# Patient Record
Sex: Male | Born: 1960
Health system: Southern US, Community
[De-identification: ages and names within clinical notes are randomized; demographics above are authoritative.]

## PROBLEM LIST (undated history)

## (undated) DIAGNOSIS — K219 Gastro-esophageal reflux disease without esophagitis: Secondary | ICD-10-CM

## (undated) DIAGNOSIS — Z8711 Personal history of peptic ulcer disease: Secondary | ICD-10-CM

## (undated) DIAGNOSIS — F1120 Opioid dependence, uncomplicated: Secondary | ICD-10-CM

## (undated) DIAGNOSIS — M1990 Unspecified osteoarthritis, unspecified site: Secondary | ICD-10-CM

## (undated) DIAGNOSIS — F1920 Other psychoactive substance dependence, uncomplicated: Secondary | ICD-10-CM

## (undated) DIAGNOSIS — E1165 Type 2 diabetes mellitus with hyperglycemia: Secondary | ICD-10-CM

## (undated) DIAGNOSIS — R32 Unspecified urinary incontinence: Secondary | ICD-10-CM

## (undated) DIAGNOSIS — I700 Atherosclerosis of aorta: Secondary | ICD-10-CM

## (undated) DIAGNOSIS — J189 Pneumonia, unspecified organism: Secondary | ICD-10-CM

## (undated) DIAGNOSIS — M5136 Other intervertebral disc degeneration, lumbar region: Secondary | ICD-10-CM

## (undated) DIAGNOSIS — Z652 Problems related to release from prison: Secondary | ICD-10-CM

## (undated) DIAGNOSIS — I10 Essential (primary) hypertension: Secondary | ICD-10-CM

## (undated) DIAGNOSIS — F331 Major depressive disorder, recurrent, moderate: Secondary | ICD-10-CM

## (undated) DIAGNOSIS — I708 Atherosclerosis of other arteries: Secondary | ICD-10-CM

## (undated) DIAGNOSIS — Z8719 Personal history of other diseases of the digestive system: Secondary | ICD-10-CM

## (undated) DIAGNOSIS — E785 Hyperlipidemia, unspecified: Secondary | ICD-10-CM

## (undated) DIAGNOSIS — R7611 Nonspecific reaction to tuberculin skin test without active tuberculosis: Secondary | ICD-10-CM

## (undated) DIAGNOSIS — H3530 Unspecified macular degeneration: Secondary | ICD-10-CM

## (undated) DIAGNOSIS — IMO0002 Reserved for concepts with insufficient information to code with codable children: Secondary | ICD-10-CM

## (undated) DIAGNOSIS — F32A Depression, unspecified: Secondary | ICD-10-CM

## (undated) DIAGNOSIS — L409 Psoriasis, unspecified: Secondary | ICD-10-CM

## (undated) DIAGNOSIS — F410 Panic disorder [episodic paroxysmal anxiety] without agoraphobia: Secondary | ICD-10-CM

## (undated) DIAGNOSIS — F064 Anxiety disorder due to known physiological condition: Secondary | ICD-10-CM

## (undated) DIAGNOSIS — H269 Unspecified cataract: Secondary | ICD-10-CM

## (undated) DIAGNOSIS — F329 Major depressive disorder, single episode, unspecified: Secondary | ICD-10-CM

## (undated) DIAGNOSIS — E1140 Type 2 diabetes mellitus with diabetic neuropathy, unspecified: Secondary | ICD-10-CM

## (undated) DIAGNOSIS — J449 Chronic obstructive pulmonary disease, unspecified: Secondary | ICD-10-CM

## (undated) DIAGNOSIS — B1920 Unspecified viral hepatitis C without hepatic coma: Secondary | ICD-10-CM

## (undated) DIAGNOSIS — M51369 Other intervertebral disc degeneration, lumbar region without mention of lumbar back pain or lower extremity pain: Secondary | ICD-10-CM

## (undated) DIAGNOSIS — J439 Emphysema, unspecified: Secondary | ICD-10-CM

## (undated) HISTORY — DX: Opioid dependence, uncomplicated: F11.20

## (undated) HISTORY — DX: Type 2 diabetes mellitus with diabetic neuropathy, unspecified: E11.40

## (undated) HISTORY — DX: Unspecified macular degeneration: H35.30

## (undated) HISTORY — DX: Reserved for concepts with insufficient information to code with codable children: IMO0002

## (undated) HISTORY — DX: Depression, unspecified: F32.A

## (undated) HISTORY — DX: Major depressive disorder, single episode, unspecified: F32.9

## (undated) HISTORY — DX: Other psychoactive substance dependence, uncomplicated: F19.20

## (undated) HISTORY — DX: Atherosclerosis of other arteries: I70.8

## (undated) HISTORY — DX: Unspecified cataract: H26.9

## (undated) HISTORY — DX: Nonspecific reaction to tuberculin skin test without active tuberculosis: R76.11

## (undated) HISTORY — DX: Type 2 diabetes mellitus with hyperglycemia: E11.65

## (undated) HISTORY — DX: Personal history of other diseases of the digestive system: Z87.19

## (undated) HISTORY — DX: Anxiety disorder due to known physiological condition: F06.4

## (undated) HISTORY — DX: Atherosclerosis of aorta: I70.0

## (undated) HISTORY — DX: Problems related to release from prison: Z65.2

## (undated) HISTORY — DX: Hyperlipidemia, unspecified: E78.5

## (undated) HISTORY — DX: Unspecified urinary incontinence: R32

## (undated) HISTORY — DX: Psoriasis, unspecified: L40.9

## (undated) HISTORY — DX: Emphysema, unspecified: J43.9

## (undated) HISTORY — DX: Major depressive disorder, recurrent, moderate: F33.1

## (undated) HISTORY — PX: STRABISMUS SURGERY: SHX218

## (undated) HISTORY — PX: TONSILLECTOMY: SUR1361

## (undated) HISTORY — DX: Chronic obstructive pulmonary disease, unspecified: J44.9

## (undated) HISTORY — DX: Other intervertebral disc degeneration, lumbar region without mention of lumbar back pain or lower extremity pain: M51.369

## (undated) HISTORY — DX: Other intervertebral disc degeneration, lumbar region: M51.36

## (undated) HISTORY — DX: Personal history of peptic ulcer disease: Z87.11

## (undated) HISTORY — DX: Gastro-esophageal reflux disease without esophagitis: K21.9

## (undated) HISTORY — DX: Anxiety disorder due to known physiological condition: F41.0

---

## 1987-03-19 DIAGNOSIS — R7611 Nonspecific reaction to tuberculin skin test without active tuberculosis: Secondary | ICD-10-CM

## 1987-03-19 HISTORY — DX: Nonspecific reaction to tuberculin skin test without active tuberculosis: R76.11

## 2014-07-17 DIAGNOSIS — Z652 Problems related to release from prison: Secondary | ICD-10-CM

## 2014-07-17 HISTORY — DX: Problems related to release from prison: Z65.2

## 2014-08-19 ENCOUNTER — Emergency Department (INDEPENDENT_AMBULATORY_CARE_PROVIDER_SITE_OTHER)
Admission: EM | Admit: 2014-08-19 | Discharge: 2014-08-19 | Disposition: A | Discharge status: Home / Self Care | Payer: Self-pay | Source: Home / Self Care | Attending: Family Medicine | Admitting: Family Medicine

## 2014-08-19 ENCOUNTER — Encounter (HOSPITAL_COMMUNITY): Payer: Self-pay

## 2014-08-19 DIAGNOSIS — N138 Other obstructive and reflux uropathy: Secondary | ICD-10-CM

## 2014-08-19 DIAGNOSIS — N401 Enlarged prostate with lower urinary tract symptoms: Secondary | ICD-10-CM

## 2014-08-19 MED ORDER — TAMSULOSIN HCL 0.4 MG PO CAPS: 0.4000 mg | ORAL_CAPSULE | Freq: Every day | ORAL | Status: DC

## 2014-08-19 NOTE — ED Notes (Signed)
Reportedly out of prison x 2 weeks, needs new Rx, as he is almost out. Has not established new PCP

## 2014-08-19 NOTE — ED Provider Notes (Signed)
CSN: [id number]     Arrival date & time 08/19/14  0900 History   First MD Initiated Contact with Patient 08/19/14 [phone]     Chief Complaint  Patient presents with  . Medication Refill   (Consider location/radiation/quality/duration/timing/severity/associated sxs/prior Treatment) Patient is a 54 y.o. male presenting with general illness. The history is provided by the patient.  Illness Severity:  Mild Chronicity:  New Context:  Promarily needs flomax for urine problem. Associated symptoms: no nausea and no vomiting   Risk factors:  Diabetic   No past medical history on file. No past surgical history on file. No family history on file. History  Substance Use Topics  . Smoking status: Not on file  . Smokeless tobacco: Not on file  . Alcohol Use: Not on file    Review of Systems  Constitutional: Negative.   Gastrointestinal: Negative for nausea and vomiting.  Genitourinary: Positive for difficulty urinating.    Allergies  Penicillins  Home Medications   Prior to Admission medications   Not on File   BP 151/64 mmHg  Pulse 87  Temp(Src) 98.4 F (36.9 C) (Oral)  Resp 16  SpO2 96% Physical Exam  Constitutional: He is oriented to person, place, and time. He appears well-developed and well-nourished.  Neck: Neck supple.  Cardiovascular: Normal rate.   Abdominal: Soft. Bowel sounds are normal.  Neurological: He is alert and oriented to person, place, and time.  Skin: Skin is warm and dry.  Nursing note and vitals reviewed.   ED Course  Procedures (including critical care time) Labs Review Labs Reviewed - No data to display  Imaging Review No results found.   MDM  No diagnosis found.     Linna HoffJames D Leaann Nevils, MD 08/19/14 1002

## 2014-09-08 ENCOUNTER — Ambulatory Visit: Payer: Self-pay | Admitting: Adult Health

## 2014-09-16 ENCOUNTER — Ambulatory Visit (INDEPENDENT_AMBULATORY_CARE_PROVIDER_SITE_OTHER): Payer: Self-pay | Admitting: Family Medicine

## 2014-09-16 ENCOUNTER — Encounter: Payer: Self-pay | Admitting: Family Medicine

## 2014-09-16 VITALS — BP 128/71 | HR 98 | Temp 98.1°F | Resp 16 | Ht 74.0 in | Wt 296.0 lb

## 2014-09-16 DIAGNOSIS — G894 Chronic pain syndrome: Secondary | ICD-10-CM

## 2014-09-16 DIAGNOSIS — E088 Diabetes mellitus due to underlying condition with unspecified complications: Secondary | ICD-10-CM

## 2014-09-16 DIAGNOSIS — E1140 Type 2 diabetes mellitus with diabetic neuropathy, unspecified: Secondary | ICD-10-CM | POA: Insufficient documentation

## 2014-09-16 DIAGNOSIS — I10 Essential (primary) hypertension: Secondary | ICD-10-CM

## 2014-09-16 DIAGNOSIS — N400 Enlarged prostate without lower urinary tract symptoms: Secondary | ICD-10-CM

## 2014-09-16 DIAGNOSIS — E118 Type 2 diabetes mellitus with unspecified complications: Secondary | ICD-10-CM

## 2014-09-16 LAB — CBC WITH DIFFERENTIAL/PLATELET
BASOS PCT: 0 % (ref 0–1)
Basophils Absolute: 0 10*3/uL (ref 0.0–0.1)
Eosinophils Absolute: 0.2 10*3/uL (ref 0.0–0.7)
Eosinophils Relative: 2 % (ref 0–5)
HEMATOCRIT: 47.2 % (ref 39.0–52.0)
Hemoglobin: 15.4 g/dL (ref 13.0–17.0)
Lymphocytes Relative: 18 % (ref 12–46)
Lymphs Abs: 1.5 10*3/uL (ref 0.7–4.0)
MCH: 30.5 pg (ref 26.0–34.0)
MCHC: 32.6 g/dL (ref 30.0–36.0)
MCV: 93.5 fL (ref 78.0–100.0)
MONO ABS: 0.8 10*3/uL (ref 0.1–1.0)
MPV: 12.8 fL — AB (ref 8.6–12.4)
Monocytes Relative: 9 % (ref 3–12)
NEUTROS ABS: 6.1 10*3/uL (ref 1.7–7.7)
NEUTROS PCT: 71 % (ref 43–77)
Platelets: 139 10*3/uL — ABNORMAL LOW (ref 150–400)
RBC: 5.05 MIL/uL (ref 4.22–5.81)
RDW: 14.5 % (ref 11.5–15.5)
WBC: 8.6 10*3/uL (ref 4.0–10.5)

## 2014-09-16 LAB — COMPLETE METABOLIC PANEL WITH GFR
ALBUMIN: 3.8 g/dL (ref 3.5–5.2)
ALT: 21 U/L (ref 0–53)
AST: 22 U/L (ref 0–37)
Alkaline Phosphatase: 103 U/L (ref 39–117)
BUN: 12 mg/dL (ref 6–23)
CALCIUM: 8.8 mg/dL (ref 8.4–10.5)
CO2: 22 mEq/L (ref 19–32)
CREATININE: 0.87 mg/dL (ref 0.50–1.35)
Chloride: 100 mEq/L (ref 96–112)
GFR, Est African American: >89 mL/min
GFR, Est Non African American: >89 mL/min
Glucose, Bld: 226 mg/dL — ABNORMAL HIGH (ref 70–99)
POTASSIUM: 4.7 meq/L (ref 3.5–5.3)
Sodium: 137 mEq/L (ref 135–145)
Total Bilirubin: 0.4 mg/dL (ref 0.2–1.2)
Total Protein: 6.3 g/dL (ref 6.0–8.3)

## 2014-09-16 MED ORDER — TAMSULOSIN HCL 0.4 MG PO CAPS: 0.4000 mg | ORAL_CAPSULE | Freq: Every day | ORAL | Status: DC

## 2014-09-16 MED ORDER — IBUPROFEN 600 MG PO TABS: 600.0000 mg | ORAL_TABLET | Freq: Three times a day (TID) | ORAL | Status: DC | PRN

## 2014-09-16 MED ORDER — GABAPENTIN 800 MG PO TABS: 800.0000 mg | ORAL_TABLET | Freq: Three times a day (TID) | ORAL | Status: DC

## 2014-09-16 MED ORDER — OMEPRAZOLE 20 MG PO CPDR: 20.0000 mg | DELAYED_RELEASE_CAPSULE | Freq: Two times a day (BID) | ORAL | Status: DC

## 2014-09-16 MED ORDER — GABAPENTIN 300 MG PO CAPS: 300.0000 mg | ORAL_CAPSULE | Freq: Two times a day (BID) | ORAL | Status: DC

## 2014-09-16 MED ORDER — TAMSULOSIN HCL 0.4 MG PO CAPS: ORAL_CAPSULE | ORAL | Status: DC

## 2014-09-16 MED ORDER — LISINOPRIL 10 MG PO TABS: 10.0000 mg | ORAL_TABLET | Freq: Every day | ORAL | Status: DC

## 2014-09-16 MED ORDER — INSULIN ASPART 100 UNIT/ML ~~LOC~~ SOLN: SUBCUTANEOUS | Status: DC

## 2014-09-16 MED ORDER — GLYBURIDE 1.25 MG PO TABS: 1.2500 mg | ORAL_TABLET | Freq: Every day | ORAL | Status: DC

## 2014-09-16 MED ORDER — OMEPRAZOLE 20 MG PO CPDR: 20.0000 mg | DELAYED_RELEASE_CAPSULE | Freq: Every day | ORAL | Status: DC

## 2014-09-16 MED ORDER — METFORMIN HCL 1000 MG PO TABS: 1000.0000 mg | ORAL_TABLET | Freq: Two times a day (BID) | ORAL | Status: DC

## 2014-09-16 MED ORDER — INSULIN SYRINGES (DISPOSABLE) U-100 1 ML MISC: Status: DC

## 2014-09-16 NOTE — Patient Instructions (Signed)
1. Low carb diet 2. Try to increase exercise 3.  Take medications as prescribed 4.  Will call with insulin dosage after labs are back. 5.  Recheck about medication assistance.

## 2014-09-16 NOTE — Progress Notes (Signed)
Patient ID: Randy BattiestRoger D Rosete, male   DOB: 1960/11/26, 54 y.o.   MRN: [id number]   Randy FordyceRoger Rockers, is a 54 y.o. male  NFA:[id number]  ION:[id number]  DOB - 1960/11/26  CC:  Chief Complaint  Patient presents with  . Establish Care    needs to re-start medications        HPI: Randy Barnes is a 54 y.o. male here today to establish medical care. He is recently out of prison after a twenty year stay and is having trouble getting his medications for diabetes, hypertension, peripheral neuropathy, GERD, BPH, chronic pain and anxiety. He also has Hepatitis C.  He has been on Novolog in the past but has run out. He is on metformin 1000 bid, glyburide 1.25 daily, lisinopril 10 daily, gabapentin 100 tid. Omeprazole 10 mg daily, flomax.4 mg daily. He is currently using OTC IBU for pain, which he reports is no adequately controlling his pain. He is also requesting Xanax for anxiety.   Allergies  Allergen Reactions  . Penicillins    No past medical history on file. Current Outpatient Prescriptions on File Prior to Visit  Medication Sig Dispense Refill  . gabapentin (NEURONTIN) 100 MG capsule Take 100 mg by mouth 3 (three) times daily.    Marland Kitchen. glyBURIDE (DIABETA) 1.25 MG tablet Take 1.25 mg by mouth daily with breakfast.    . lisinopril (PRINIVIL,ZESTRIL) 10 MG tablet Take 10 mg by mouth daily.    . metFORMIN (GLUCOPHAGE) 1000 MG tablet Take 1,000 mg by mouth 2 (two) times daily with a meal.    . omeprazole (PRILOSEC) 10 MG capsule Take 10 mg by mouth daily.    . tamsulosin (FLOMAX) 0.4 MG CAPS capsule Take 1 capsule (0.4 mg total) by mouth daily after supper. 30 capsule 1  . ALPRAZolam (XANAX) 0.25 MG tablet Take 0.25 mg by mouth at bedtime as needed for anxiety.     No current facility-administered medications on file prior to visit.   No family history on file. History   Social History  . Marital Status: Single    Spouse Name: N/A  . Number of Children: N/A  . Years of Education: N/A    Occupational History  . Not on file.   Social History Main Topics  . Smoking status: Current Every Day Smoker -- 0.50 packs/day    Types: Cigarettes  . Smokeless tobacco: Never Used  . Alcohol Use: 3.6 oz/week    6 Cans of beer per week  . Drug Use: Yes    Special: Marijuana  . Sexual Activity: No   Other Topics Concern  . Not on file   Social History Narrative    Review of Systems: Constitutional: Negative for fever, chills, appetite change, weight loss. Positive for fatigue. Skin: Positive for rashes HENT: Negative for ear pain, ear discharge.nose bleeds Eyes: Negative for pain, discharge, redness, itching and visual disturbance.Positive for decreased vision, wears glasses. Neck: Negative for pain, stiffness Respiratory: Negative for cough. Positive for SOB with anxiety attacks Cardiovascular: Negative for chest pain, palpitations. Positive for intermittent lower extremety edema. Gastrointestinal: Negative for abdominal distention, abdominal pain, nausea, vomiting, diarrhea, constipations. Positive for heartburn Genitourinary: Negative for dysuria, urgency, frequency, hematuria, flank pain,  Musculoskeletal: Negative for back pain, joint pain, joint  swelling, arthralgia and gait problem.Negative for weakness. Neurological: Negative for dizziness, tremors, seizures, syncope,   light-headedness. Positive for numbness and tingling of feet  And for headaches.  Hematological: Negative for easy bruising or bleeding Psychiatric/Behavioral: Negative for depression,  decreased concentration, confusion. Positive for anxiety   Objective:   Filed Vitals:   09/16/14 0837  BP: 128/71  Pulse: 98  Temp: 98.1 F (36.7 C)  Resp: 16    Physical Exam: Constitutional: Patient appears well-developed and well-nourished. No distress.OBESE HENT: Normocephalic, atraumatic, External right and left ear normal. Oropharynx is clear and moist.  Eyes: Conjunctivae and EOM are normal. PERRLA,  no scleral icterus. Neck: Normal ROM. Neck supple. No lymphadenopathy, No thyromegaly. CVS: RRR, S1/S2 +, no murmurs, no gallops, no rubs. There is discoloration of lower legs with petechiae and two healed lesions. Pulmonary: Effort and breath sounds normal, no stridor, rhonchi, wheezes, rales.  Abdominal: Soft. Normoactive BS,, no distension, tenderness, rebound or guarding.  Musculoskeletal: Normal range of motion. No edema and no tenderness.  Neuro: Alert.Normal muscle tone coordination. Non-focal Skin: Skin is warm and dry. There is a scattered ecezamatous rash. The Psychiatric: Normal mood and affect. Behavior, judgment, thought content normal.  No results found for: WBC, HGB, HCT, MCV, PLT No results found for: CREATININE, BUN, NA, K, CL, CO2  No results found for: HGBA1C Lipid Panel  No results found for: CHOL, TRIG, HDL, CHOLHDL, VLDL, LDLCALC     Assessment and plan:   Diabetes, uncontrolled  - continue metformina 1000 bid -continue glyburide 1.25 daily -Cmet with [facility] today -A1C today.  - Novolog insulin, will call with dosage after labs  Hypertension - lisinopril 10 mg daily for hypertension and renal protection  BPH - continue Flomax .  daily  Peripheral neuropathy -gabapentin 300 mg bid -He is to check on medication assistance and we can consider Lyrica if he can get that covered  Chronic pain -I have explained we do no prescribe narcotics for chronic pain -ibuprofen 800 mg tid prn -will attempt referral to pain clinic  GERD, inadequately controlled -increase omerprazole from 10 to 20 mg. daily  He is to follow-up in one month for reevaluation of chronic conditions.    The patient was given clear instructions to go to ER or return to medical center if symptoms don't improve, worsen or new problems develop. The patient verbalized understanding. The patient was told to call to get lab results if they haven't heard anything in the next week.         Henrietta Hoover, MSN, FNP-BC  09/16/2014, 9:16 AM

## 2014-09-17 LAB — HEMOGLOBIN A1C
HEMOGLOBIN A1C: 8.4 % — AB (ref <5.7)
Mean Plasma Glucose: 194 mg/dL — ABNORMAL HIGH (ref <117)

## 2014-09-21 ENCOUNTER — Telehealth: Payer: Self-pay

## 2014-09-21 NOTE — Telephone Encounter (Signed)
-----   Message from Henrietta HooverLinda C Bernhardt, NP sent at 09/20/2014  8:26 AM EDT ----- HBgA1C 8.4. We really need to work on getting below 8, preferable less than 7.5. Watch diet very carefully for carbs and concentrated sweets. Try to increase exercise. Lets increase glyburide to 2 a day in am. When you run out witll increase dosage.

## 2014-09-21 NOTE — Telephone Encounter (Signed)
Patient returned call, I advised patient of elevated hgba1c and to increase glyburide to 2 tablets a day, to modify diet and increase exercise. Patient verbalized understanding. Thanks!

## 2014-09-21 NOTE — Telephone Encounter (Signed)
Called and left message for patient to return call regarding lab work. Thanks!  

## 2014-10-21 ENCOUNTER — Ambulatory Visit: Payer: Self-pay

## 2014-10-25 ENCOUNTER — Ambulatory Visit: Payer: Self-pay | Admitting: Family Medicine

## 2014-11-13 ENCOUNTER — Encounter (HOSPITAL_COMMUNITY): Payer: Self-pay

## 2014-11-13 ENCOUNTER — Emergency Department (HOSPITAL_COMMUNITY)
Admission: EM | Admit: 2014-11-13 | Discharge: 2014-11-13 | Disposition: A | Discharge status: Home / Self Care | Payer: Self-pay | Attending: Emergency Medicine | Admitting: Emergency Medicine

## 2014-11-13 DIAGNOSIS — Z79899 Other long term (current) drug therapy: Secondary | ICD-10-CM | POA: Insufficient documentation

## 2014-11-13 DIAGNOSIS — Z794 Long term (current) use of insulin: Secondary | ICD-10-CM | POA: Insufficient documentation

## 2014-11-13 DIAGNOSIS — I10 Essential (primary) hypertension: Secondary | ICD-10-CM | POA: Insufficient documentation

## 2014-11-13 DIAGNOSIS — Y9389 Activity, other specified: Secondary | ICD-10-CM | POA: Insufficient documentation

## 2014-11-13 DIAGNOSIS — Y9289 Other specified places as the place of occurrence of the external cause: Secondary | ICD-10-CM | POA: Insufficient documentation

## 2014-11-13 DIAGNOSIS — S3992XA Unspecified injury of lower back, initial encounter: Secondary | ICD-10-CM | POA: Insufficient documentation

## 2014-11-13 DIAGNOSIS — E119 Type 2 diabetes mellitus without complications: Secondary | ICD-10-CM | POA: Insufficient documentation

## 2014-11-13 DIAGNOSIS — W1839XA Other fall on same level, initial encounter: Secondary | ICD-10-CM | POA: Insufficient documentation

## 2014-11-13 DIAGNOSIS — Y998 Other external cause status: Secondary | ICD-10-CM | POA: Insufficient documentation

## 2014-11-13 DIAGNOSIS — Z8619 Personal history of other infectious and parasitic diseases: Secondary | ICD-10-CM | POA: Insufficient documentation

## 2014-11-13 DIAGNOSIS — M5441 Lumbago with sciatica, right side: Secondary | ICD-10-CM | POA: Insufficient documentation

## 2014-11-13 DIAGNOSIS — M1990 Unspecified osteoarthritis, unspecified site: Secondary | ICD-10-CM | POA: Insufficient documentation

## 2014-11-13 DIAGNOSIS — S8991XA Unspecified injury of right lower leg, initial encounter: Secondary | ICD-10-CM | POA: Insufficient documentation

## 2014-11-13 DIAGNOSIS — Z720 Tobacco use: Secondary | ICD-10-CM | POA: Insufficient documentation

## 2014-11-13 DIAGNOSIS — Z880 Allergy status to penicillin: Secondary | ICD-10-CM | POA: Insufficient documentation

## 2014-11-13 HISTORY — DX: Essential (primary) hypertension: I10

## 2014-11-13 HISTORY — DX: Unspecified viral hepatitis C without hepatic coma: B19.20

## 2014-11-13 HISTORY — DX: Unspecified osteoarthritis, unspecified site: M19.90

## 2014-11-13 MED ORDER — METHOCARBAMOL 1000 MG/10ML IJ SOLN
1000.0000 mg | Freq: Once | INTRAMUSCULAR | Status: DC
  Filled 2014-11-13: qty 10

## 2014-11-13 MED ORDER — METHOCARBAMOL 500 MG PO TABS
1000.0000 mg | ORAL_TABLET | Freq: Once | ORAL | Status: AC
  Administered 2014-11-13: 1000 mg via ORAL
  Filled 2014-11-13: qty 2

## 2014-11-13 MED ORDER — KETOROLAC TROMETHAMINE 60 MG/2ML IM SOLN
60.0000 mg | Freq: Once | INTRAMUSCULAR | Status: AC
  Administered 2014-11-13: 60 mg via INTRAMUSCULAR
  Filled 2014-11-13: qty 2

## 2014-11-13 MED ORDER — CYCLOBENZAPRINE HCL 10 MG PO TABS: 10.0000 mg | ORAL_TABLET | Freq: Two times a day (BID) | ORAL | Status: DC | PRN

## 2014-11-13 NOTE — Discharge Instructions (Signed)
Back Pain, Adult °Back pain is very common. The pain often gets better over time. The cause of back pain is usually not dangerous. Most people can learn to manage their back pain on their own.  °HOME CARE  °· Stay active. Start with short walks on flat ground if you can. Try to walk farther each day. °· Do not sit, drive, or stand in one place for more than 30 minutes. Do not stay in bed. °· Do not avoid exercise or work. Activity can help your back heal faster. °· Be careful when you bend or lift an object. Bend at your knees, keep the object close to you, and do not twist. °· Sleep on a firm mattress. Lie on your side, and bend your knees. If you lie on your back, put a pillow under your knees. °· Only take medicines as told by your doctor. °· Put ice on the injured area. °¨ Put ice in a plastic bag. °¨ Place a towel between your skin and the bag. °¨ Leave the ice on for 15-20 minutes, 03-04 times a day for the first 2 to 3 days. After that, you can switch between ice and heat packs. °· Ask your doctor about back exercises or massage. °· Avoid feeling anxious or stressed. Find good ways to deal with stress, such as exercise. °GET HELP RIGHT AWAY IF:  °· Your pain does not go away with rest or medicine. °· Your pain does not go away in 1 week. °· You have new problems. °· You do not feel well. °· The pain spreads into your legs. °· You cannot control when you poop (bowel movement) or pee (urinate). °· Your arms or legs feel weak or lose feeling (numbness). °· You feel sick to your stomach (nauseous) or throw up (vomit). °· You have belly (abdominal) pain. °· You feel like you may pass out (faint). °MAKE SURE YOU:  °· Understand these instructions. °· Will watch your condition. °· Will get help right away if you are not doing well or get worse. °Document Released: 08/21/2007 Document Revised: 05/27/2011 Document Reviewed: 07/06/2013 °[employer]® Patient Information ©2015 [employer]. This information is not intended  to replace advice given to you by your health care provider. Make sure you discuss any questions you have with your health care provider. ° °

## 2014-11-13 NOTE — ED Notes (Signed)
Pt states that he is hungry and ready to go. Pt given [place] sandwich and sprite and informed that the Dr will be back in to see him. Pt is fine now.

## 2014-11-13 NOTE — ED Notes (Signed)
Pt verbalized understanding of d/c instructions and has no further questions. Pt stable and NAD.  

## 2014-11-13 NOTE — ED Notes (Signed)
Onset yesterday pt was getting up from table, got cramp in left leg and fell down on right knee (small abrasion noted) and fell over on right shoulder.  Today having lower back pain.  Pt reports has had recent falls r/t neuropathy in feet.

## 2014-11-13 NOTE — ED Notes (Signed)
Resident notified that pt is ready to leave, plan to see MD p/t discharge

## 2014-11-13 NOTE — ED Provider Notes (Signed)
CSN: [id number]     Arrival date & time 11/13/14  1543 History   First MD Initiated Contact with Patient 11/13/14 1921     Chief Complaint  Patient presents with  . Fall  . Knee Pain  . Back Pain     (Consider location/radiation/quality/duration/timing/severity/associated sxs/prior Treatment) Patient is a 54 y.o. male presenting with back pain.  Back Pain Location:  Lumbar spine Quality:  Aching Radiates to:  R posterior upper leg and L posterior upper leg Pain severity:  Severe Pain is:  Same all the time Onset quality:  Gradual Duration:  1 day Timing:  Constant Progression:  Unchanged Chronicity:  Chronic Context: falling   Relieved by:  Nothing Worsened by:  Movement Ineffective treatments:  NSAIDs Associated symptoms: no abdominal pain, no bladder incontinence, no chest pain, no dysuria, no fever, no headaches and no weakness   Risk factors: obesity     Past Medical History  Diagnosis Date  . Diabetes mellitus without complication   . Hepatitis C   . Hypertension   . Neuropathy   . Panic attacks   . Arthritis     back, DDD    Past Surgical History  Procedure Laterality Date  . Eye surgery      eye surgery   . Tonsillectomy     History reviewed. No pertinent family history. Social History  Substance Use Topics  . Smoking status: Current Every Day Smoker -- 1.00 packs/day    Types: Cigarettes  . Smokeless tobacco: Never Used  . Alcohol Use: 3.6 oz/week    6 Cans of beer per week     Comment: occ     Review of Systems  Constitutional: Negative for fever and chills.  HENT: Negative for congestion and sore throat.   Eyes: Negative for visual disturbance.  Respiratory: Negative for shortness of breath and wheezing.   Cardiovascular: Negative for chest pain.  Gastrointestinal: Negative for nausea, vomiting, abdominal pain, diarrhea and constipation.  Genitourinary: Negative for bladder incontinence, dysuria and difficulty urinating.  Musculoskeletal:  Positive for back pain. Negative for myalgias, arthralgias and neck pain.  Skin: Negative for wound.  Neurological: Negative for syncope, weakness and headaches.  Psychiatric/Behavioral: Negative for behavioral problems.  All other systems reviewed and are negative.     Allergies  Penicillins  Home Medications   Prior to Admission medications   Medication Sig Start Date End Date Taking? Authorizing Provider  glipiZIDE (GLUCOTROL) 5 MG tablet Take 5 mg by mouth daily before breakfast.   Yes Historical Provider, MD  insulin NPH Human (HUMULIN N,NOVOLIN N) 100 UNIT/ML injection Inject 0-10 Units into the skin 2 (two) times daily before a meal.   Yes Historical Provider, MD  lisinopril (PRINIVIL,ZESTRIL) 40 MG tablet Take 40 mg by mouth daily.   Yes Historical Provider, MD  metFORMIN (GLUCOPHAGE) 1000 MG tablet Take 1 tablet (1,000 mg total) by mouth 2 (two) times daily with a meal. 09/16/14  Yes Henrietta Hoover, NP  tamsulosin (FLOMAX) 0.4 MG CAPS capsule Take twice a day 09/16/14  Yes Henrietta Hoover, NP  cyclobenzaprine (FLEXERIL) 10 MG tablet Take 1 tablet (10 mg total) by mouth 2 (two) times daily as needed for muscle spasms. 11/13/14   Beverely Risen, MD  gabapentin (NEURONTIN) 800 MG tablet Take 1 tablet (800 mg total) by mouth 3 (three) times daily. Patient taking differently: Take 800 mg by mouth 4 (four) times daily.  09/16/14   Henrietta Hoover, NP  glyBURIDE (DIABETA) 1.25  MG tablet Take 1 tablet (1.25 mg total) by mouth daily with breakfast. 09/16/14   Henrietta Hoover, NP  ibuprofen (ADVIL,MOTRIN) 600 MG tablet Take 1 tablet (600 mg total) by mouth every 8 (eight) hours as needed. 09/16/14   Henrietta Hoover, NP  insulin aspart (NOVOLOG) 100 UNIT/ML injection Will call with dosage after labs back. 09/16/14   Henrietta Hoover, NP  Insulin Syringes, Disposable, U-100 1 ML MISC Check blood sugar before meals and at bedtime. 09/16/14   Henrietta Hoover, NP  lisinopril (PRINIVIL,ZESTRIL) 10  MG tablet Take 1 tablet (10 mg total) by mouth daily. 09/16/14   Henrietta Hoover, NP  omeprazole (PRILOSEC) 20 MG capsule Take 1 capsule (20 mg total) by mouth 2 (two) times daily before a meal. 09/16/14   Henrietta Hoover, NP   BP 137/81 mmHg  Pulse 98  Temp(Src) 98.3 F (36.8 C) (Oral)  Resp 20  Ht  (1.88 m)  Wt 296 lb 8 oz (134.492 kg)  BMI 38.05 kg/m2  SpO2 95% Physical Exam  Constitutional: He is oriented to person, place, and time. He appears well-developed and well-nourished.  HENT:  Head: Normocephalic and atraumatic.  Eyes: EOM are normal.  Neck: Normal range of motion.  Cardiovascular: Normal rate, regular rhythm and normal heart sounds.   No murmur heard. Pulmonary/Chest: Effort normal and breath sounds normal. No respiratory distress.  Abdominal: Soft. There is no tenderness.  Musculoskeletal: He exhibits no edema.  Neurological: He is alert and oriented to person, place, and time.  Skin: No rash noted. He is not diaphoretic.    ED Course  Procedures (including critical care time) Labs Review Labs Reviewed - No data to display  Imaging Review No results found. I have personally reviewed and evaluated these images and lab results as part of my medical decision-making.   EKG Interpretation None      MDM   Final diagnoses:  Bilateral low back pain with right-sided sciatica     Patient is a 54 year old male with a history of diabetes with diabetic neuropathy and bilateral feet that presents after fall yesterday. Patient states he has chronic low back pain and the fall exacerbated his back pain. Patient landed on his left shoulder and right knee however denies pain in the event this time. Patient denies any loss of bowel or bladder. On arrival to the ED the patient is afebrile so vital signs. Patient has no weakness in his lower extremities has normal rectal tone and has no signs of cauda equina. Patient given IM pain medication with improvement. Patient  discharged in good condition.    Beverely Risen, MD 11/13/14 1610  Tilden Fossa, MD 11/14/14 1455

## 2014-11-13 NOTE — ED Notes (Signed)
Pt walked out to the car

## 2014-12-12 LAB — COMPREHENSIVE METABOLIC PANEL
ALK PHOS: 80 U/L
ALT: 22
AST: 24 U/L
Creat: 0.95
Glucose: 132
Potassium: 4.5 mmol/L
Sodium: 139
Total Bilirubin: 0.5 mg/dL

## 2014-12-12 LAB — LIPID PANEL
CHOLESTEROL: 158
HDL: 44
LDL (calc): 78
TRIGLYCERIDES: 181

## 2014-12-12 LAB — HEMOGLOBIN A1C: A1C: 5.7

## 2014-12-12 LAB — TESTOSTERONE: Testosterone, total: 771

## 2014-12-12 LAB — CBC
HEMOGLOBIN: 17.1 g/dL
PLATELET COUNT: 130
WBC: 6.6

## 2014-12-12 LAB — TSH: TSH: 2.185

## 2014-12-26 ENCOUNTER — Ambulatory Visit (INDEPENDENT_AMBULATORY_CARE_PROVIDER_SITE_OTHER): Payer: Self-pay | Admitting: Family Medicine

## 2014-12-26 ENCOUNTER — Encounter: Payer: Self-pay | Admitting: Family Medicine

## 2014-12-26 VITALS — BP 110/60 | HR 100 | Temp 98.3°F | Ht 74.0 in | Wt 284.2 lb

## 2014-12-26 DIAGNOSIS — K219 Gastro-esophageal reflux disease without esophagitis: Secondary | ICD-10-CM | POA: Insufficient documentation

## 2014-12-26 DIAGNOSIS — I10 Essential (primary) hypertension: Secondary | ICD-10-CM

## 2014-12-26 DIAGNOSIS — E1165 Type 2 diabetes mellitus with hyperglycemia: Secondary | ICD-10-CM

## 2014-12-26 DIAGNOSIS — J439 Emphysema, unspecified: Secondary | ICD-10-CM | POA: Insufficient documentation

## 2014-12-26 DIAGNOSIS — M545 Low back pain, unspecified: Secondary | ICD-10-CM | POA: Insufficient documentation

## 2014-12-26 DIAGNOSIS — G8929 Other chronic pain: Secondary | ICD-10-CM

## 2014-12-26 DIAGNOSIS — N400 Enlarged prostate without lower urinary tract symptoms: Secondary | ICD-10-CM

## 2014-12-26 DIAGNOSIS — B182 Chronic viral hepatitis C: Secondary | ICD-10-CM | POA: Insufficient documentation

## 2014-12-26 DIAGNOSIS — E785 Hyperlipidemia, unspecified: Secondary | ICD-10-CM

## 2014-12-26 DIAGNOSIS — B1920 Unspecified viral hepatitis C without hepatic coma: Secondary | ICD-10-CM

## 2014-12-26 DIAGNOSIS — F410 Panic disorder [episodic paroxysmal anxiety] without agoraphobia: Secondary | ICD-10-CM

## 2014-12-26 DIAGNOSIS — IMO0002 Reserved for concepts with insufficient information to code with codable children: Secondary | ICD-10-CM

## 2014-12-26 DIAGNOSIS — E1140 Type 2 diabetes mellitus with diabetic neuropathy, unspecified: Secondary | ICD-10-CM

## 2014-12-26 DIAGNOSIS — F064 Anxiety disorder due to known physiological condition: Secondary | ICD-10-CM | POA: Insufficient documentation

## 2014-12-26 MED ORDER — AMITRIPTYLINE HCL 50 MG PO TABS: 25.0000 mg | ORAL_TABLET | Freq: Every day | ORAL | Status: DC

## 2014-12-26 NOTE — Assessment & Plan Note (Signed)
Await records. Off statin.

## 2014-12-26 NOTE — Assessment & Plan Note (Signed)
Chronic. Will request recent records including labwork. Continue gabapentin for nerve pain. F/u in 1-2 mo. Continue metformin  bid, glipizide  daily and NPH 10u BID.

## 2014-12-26 NOTE — Assessment & Plan Note (Signed)
Chronic, stable. Continue lisinopril 40mg daily.  

## 2014-12-26 NOTE — Progress Notes (Signed)
BP 110/60 mmHg  Pulse 100  Temp(Src) 98.3 F (36.8 C) (Oral)  Ht 6' 2"  (1.88 m)  Wt 284 lb 4 oz (128.935 kg)  BMI 36.48 kg/m2  SpO2 94%   CC: new pt to establish care  Subjective:     Patient ID: Randy Barnes, male    DOB: 1960-07-09, 54 y.o.   MRN: [id number]  HPI: Randy Barnes is a 54 y.o. male presenting on 12/26/2014 for new patient to establish   Recently released from prison after 20 yrs (07/2014). Did 2 sentences. History of diabetes with periph neuropathy, hypertension, GERD, BPH, chronic pain and anxiety with panic attacks and hepatitis C. Recently got turned down for disability.   Endorses h/o anxiety/depression and panic attacks. Last attack was 2 nights ago. Prior to prison treated with xanax, during prison treated with klonopin. Also does healthy stress relieving strategies - meditating, deep breathing. Has seen psychiatrist previously (in [facility]), cymbalta didn't help. Did not tolerate amitriptyline side effects. Hydroxyzine didn't help either.   Has been seeing NP at [facility]: [facility] [place]. [facility]  Recently treated for COPD exacerbation with prednisone course and levaquin antibiotic. Stopped prednisone. Finishing levaquin. Endorses cough with increased sputum and dyspnea/wheezing. Lost advair. Takes albuterol prn.   Smoker - 1 ppd. Planning on starting nicorette gum. Recently restarted 08/2014 (had quit for 6 yrs).   DM - regularly does check sugars twice daily 100-140 fasting. Compliant with antihyperglycemic regimen which includes: metformin 1023m bid, glipizide 2.572mdaily and NPH 10u BID (not taking twice daily however). Was recently started on novolog insulin. Occasional low sugars and hypoglycemic symptoms.  Known upper and lower extremity neuropathy on gabapentin 80061mid. Last diabetic eye exam DUE.  Pneumovax: DUE.  Prevnar: not due. Lab Results  Component Value Date   HGBA1C 8.4* 09/16/2014    Diabetic Foot Exam - Simple   Simple Foot Form  Diabetic Foot exam was performed with the following findings:  Yes 12/26/2014 10:24 AM  Visual Inspection  No deformities, no ulcerations, no other skin breakdown bilaterally:  Yes  Sensation Testing  See comments:  Yes  Pulse Check  Posterior Tibialis and Dorsalis pulse intact bilaterally:  Yes  Comments  Diminished sensation to monofilament testing     HTN - Compliant with current antihypertensive regimen of lisinopril 61m79mily. Does not check blood pressures at home. No low blood pressure symptoms of dizziness/syncope. Denies HA, vision changes, CP/tightness, leg swelling.    Chronic lower back pain ongoing since 1990s and progressively worsening - known DDD. At times debilitating. Takes ibuprofen 800mg32m. Has not seen pain clinic (denied).   BPH - sxs controlled on flomax 0.4mg d49my.  GERD - on omeprazole 20mg b27m  Endorses h/o hep C. Interested in treatment.  Preventative: Around 07/2014 at prison  [place] Edu: some college Occupation: unemployed  Relevant past medical, surgical, family and social history reviewed and updated as indicated. Interim medical history since our last visit reviewed. Allergies and medications reviewed and updated. Current Outpatient Prescriptions on File Prior to Visit  Medication Sig  . insulin NPH Human (HUMULIN N,NOVOLIN N) 100 UNIT/ML injection Inject 10 Units into the skin 2 (two) times daily before a meal.   . Insulin Syringes, Disposable, U-100 1 ML MISC Check blood sugar before meals and at bedtime.  . lisinMarland Kitchenpril (PRINIVIL,ZESTRIL) 40 MG tablet Take 40 mg  by mouth daily.  . metFORMIN (GLUCOPHAGE) 1000 MG tablet Take 1 tablet (1,000 mg total) by mouth 2 (two) times daily with a meal.  . omeprazole (PRILOSEC) 20 MG capsule Take 1 capsule (20 mg total) by mouth 2 (two) times daily before a meal.  . tamsulosin (FLOMAX) 0.4 MG CAPS  capsule Take twice a day   No current facility-administered medications on file prior to visit.    Review of Systems Per HPI unless specifically indicated above     Objective:    BP 110/60 mmHg  Pulse 100  Temp(Src) 98.3 F (36.8 C) (Oral)  Ht 6' 2"  (1.88 m)  Wt 284 lb 4 oz (128.935 kg)  BMI 36.48 kg/m2  SpO2 94%  Wt Readings from Last 3 Encounters:  12/26/14 284 lb 4 oz (128.935 kg)  11/13/14 296 lb 8 oz (134.492 kg)  09/16/14 296 lb (134.265 kg)    Physical Exam  Constitutional: He appears well-developed and well-nourished. No distress.  HENT:  Head: Normocephalic and atraumatic.  Right Ear: External ear normal.  Left Ear: External ear normal.  Nose: Nose normal.  Mouth/Throat: Oropharynx is clear and moist. No oropharyngeal exudate.  Eyes: Conjunctivae and EOM are normal. Pupils are equal, round, and reactive to light. No scleral icterus.  Neck: Normal range of motion. Neck supple.  Cardiovascular: Normal rate, regular rhythm, normal heart sounds and intact distal pulses.   No murmur heard. Pulmonary/Chest: Effort normal and breath sounds normal. No respiratory distress. He has no wheezes. He has no rales.  Musculoskeletal: He exhibits no edema.  See HPI for foot exam if done  Lymphadenopathy:    He has no cervical adenopathy.  Skin: Skin is warm and dry. No rash noted.  Psychiatric: He has a normal mood and affect.  Nursing note and vitals reviewed.  Results for orders placed or performed in visit on 09/16/14  COMPLETE METABOLIC PANEL WITH GFR  Result Value Ref Range   Sodium 137 135 - 145 mEq/L   Potassium 4.7 3.5 - 5.3 mEq/L   Chloride 100 96 - 112 mEq/L   CO2 22 19 - 32 mEq/L   Glucose, Bld 226 (H) 70 - 99 mg/dL   BUN 12 6 - 23 mg/dL   Creat 0.87 0.50 - 1.35 mg/dL   Total Bilirubin 0.4 0.2 - 1.2 mg/dL   Alkaline Phosphatase 103 39 - 117 U/L   AST 22 0 - 37 U/L   ALT 21 0 - 53 U/L   Total Protein 6.3 6.0 - 8.3 g/dL   Albumin 3.8 3.5 - 5.2 g/dL    Calcium 8.8 8.4 - 10.5 mg/dL   GFR, Est African American >89 mL/min   GFR, Est Non African American >89 mL/min  CBC with Differential  Result Value Ref Range   WBC 8.6 4.0 - 10.5 K/uL   RBC 5.05 4.22 - 5.81 MIL/uL   Hemoglobin 15.4 13.0 - 17.0 g/dL   HCT 47.2 39.0 - 52.0 %   MCV 93.5 78.0 - 100.0 fL   MCH 30.5 26.0 - 34.0 pg   MCHC 32.6 30.0 - 36.0 g/dL   RDW 14.5 11.5 - 15.5 %   Platelets 139 (L) 150 - 400 K/uL   MPV 12.8 (H) 8.6 - 12.4 fL   Neutrophils Relative % 71 43 - 77 %   Neutro Abs 6.1 1.7 - 7.7 K/uL   Lymphocytes Relative 18 12 - 46 %   Lymphs Abs 1.5 0.7 - 4.0 K/uL   Monocytes  Relative 9 3 - 12 %   Monocytes Absolute 0.8 0.1 - 1.0 K/uL   Eosinophils Relative 2 0 - 5 %   Eosinophils Absolute 0.2 0.0 - 0.7 K/uL   Basophils Relative 0 0 - 1 %   Basophils Absolute 0.0 0.0 - 0.1 K/uL   Smear Review Criteria for review not met   Hemoglobin A1c  Result Value Ref Range   Hgb A1c MFr Bld 8.4 (H) <5.7 %   Mean Plasma Glucose 194 (H) <117 mg/dL      Assessment & Plan:   Problem List Items Addressed This Visit    Uncontrolled type 2 diabetes with neuropathy ([place]) - Primary    Chronic. Will request recent records including labwork. Continue gabapentin for nerve pain. F/u in 1-2 mo. Continue metformin 1068m bid, glipizide 515mdaily and NPH 10u BID.       Relevant Medications   glipiZIDE (GLUCOTROL) 5 MG tablet   Hyperlipidemia    Await records. Off statin.       Hepatitis C    Await records. May refer to hep C clinic.      GERD (gastroesophageal reflux disease)    Continue low dose ppi bid.      Essential hypertension    Chronic, stable. Continue lisinopril 4023maily.      Emphysema of lung (HCCLewis  Currently being treated for COPD exacerbation with levaquin course. Lungs overall clear today. Lost advair - will refill. Continue albuterol prn.      Relevant Medications   albuterol (VENTOLIN HFA) 108 (90 BASE) MCG/ACT inhaler   Chronic low back pain    Pt  states prison xray showed lumbar DDD. Await records, will likely need updated imaging. Continue ibuprofen 800m38mrial amitriptyline, consider tramadol.      Relevant Medications   ibuprofen (ADVIL,MOTRIN) 800 MG tablet   BPH (benign prostatic hyperplasia)    Continue flomax.      Anxiety disorder due to general medical condition with panic attack    Will review psych records pt brings today. Cymbalta, hydroxyzine not helpful prior. Has also been on xanax and klonopin. Avoid controlled substances for now. Will trial amitriptyline 25-50mg87mhtly.       Relevant Medications   amitriptyline (ELAVIL) 50 MG tablet       Follow up plan: Return in about 1 month (around 01/26/2015), or as needed, for follow up visit.

## 2014-12-26 NOTE — Assessment & Plan Note (Addendum)
Will review psych records pt brings today. Cymbalta, hydroxyzine not helpful prior. Has also been on xanax and klonopin. Avoid controlled substances for now. Will trial amitriptyline 25-50mg  nightly.

## 2014-12-26 NOTE — Assessment & Plan Note (Signed)
Continue flomax  

## 2014-12-26 NOTE — Assessment & Plan Note (Signed)
Continue low dose ppi bid.

## 2014-12-26 NOTE — Assessment & Plan Note (Signed)
Pt states prison xray showed lumbar DDD. Await records, will likely need updated imaging. Continue ibuprofen , trial amitriptyline, consider tramadol.

## 2014-12-26 NOTE — Patient Instructions (Addendum)
Schedule diabetic eye exam when you can as you're due.  Start amitriptyline 25-50mg  nightly.  Continue other meds currently as below.  Decrease glipizide to 2.5mg  twice daily with meals. Return in 1 month for follow up.

## 2014-12-26 NOTE — Assessment & Plan Note (Signed)
Currently being treated for COPD exacerbation with levaquin course. Lungs overall clear today. Lost advair - will refill. Continue albuterol prn.

## 2014-12-26 NOTE — Progress Notes (Signed)
Pre visit review using our clinic review tool, if applicable. No additional management support is needed unless otherwise documented below in the visit note. 

## 2014-12-26 NOTE — Assessment & Plan Note (Signed)
Await records. May refer to hep C clinic.

## 2015-01-08 ENCOUNTER — Encounter: Payer: Self-pay | Admitting: Family Medicine

## 2015-01-08 DIAGNOSIS — H3530 Unspecified macular degeneration: Secondary | ICD-10-CM | POA: Insufficient documentation

## 2015-01-08 DIAGNOSIS — L409 Psoriasis, unspecified: Secondary | ICD-10-CM

## 2015-01-08 HISTORY — DX: Psoriasis, unspecified: L40.9

## 2015-01-09 ENCOUNTER — Emergency Department (HOSPITAL_COMMUNITY)
Admission: EM | Admit: 2015-01-09 | Discharge: 2015-01-09 | Discharge status: Against Medical Advice | Payer: Self-pay | Attending: Emergency Medicine | Admitting: Emergency Medicine

## 2015-01-09 ENCOUNTER — Encounter (HOSPITAL_COMMUNITY): Payer: Self-pay | Admitting: Emergency Medicine

## 2015-01-09 ENCOUNTER — Telehealth: Payer: Self-pay | Admitting: Family Medicine

## 2015-01-09 ENCOUNTER — Emergency Department (HOSPITAL_COMMUNITY): Payer: Self-pay

## 2015-01-09 DIAGNOSIS — I10 Essential (primary) hypertension: Secondary | ICD-10-CM | POA: Insufficient documentation

## 2015-01-09 DIAGNOSIS — Z720 Tobacco use: Secondary | ICD-10-CM | POA: Insufficient documentation

## 2015-01-09 DIAGNOSIS — R042 Hemoptysis: Secondary | ICD-10-CM | POA: Insufficient documentation

## 2015-01-09 DIAGNOSIS — E1140 Type 2 diabetes mellitus with diabetic neuropathy, unspecified: Secondary | ICD-10-CM | POA: Insufficient documentation

## 2015-01-09 DIAGNOSIS — J439 Emphysema, unspecified: Secondary | ICD-10-CM | POA: Insufficient documentation

## 2015-01-09 LAB — CBC
HCT: 48.8 % (ref 39.0–52.0)
Hemoglobin: 16.4 g/dL (ref 13.0–17.0)
MCH: 32.9 pg (ref 26.0–34.0)
MCHC: 33.6 g/dL (ref 30.0–36.0)
MCV: 98 fL (ref 78.0–100.0)
Platelets: 111 10*3/uL — ABNORMAL LOW (ref 150–400)
RBC: 4.98 MIL/uL (ref 4.22–5.81)
RDW: 14.9 % (ref 11.5–15.5)
WBC: 8.4 10*3/uL (ref 4.0–10.5)

## 2015-01-09 LAB — BASIC METABOLIC PANEL
ANION GAP: 8 (ref 5–15)
BUN: 13 mg/dL (ref 6–20)
CO2: 24 mmol/L (ref 22–32)
CREATININE: 0.9 mg/dL (ref 0.61–1.24)
Calcium: 9.4 mg/dL (ref 8.9–10.3)
Chloride: 105 mmol/L (ref 101–111)
GFR calc Af Amer: >60 mL/min (ref >60)
GFR calc non Af Amer: >60 mL/min (ref >60)
Glucose, Bld: 164 mg/dL — ABNORMAL HIGH (ref 65–99)
POTASSIUM: 4.6 mmol/L (ref 3.5–5.1)
SODIUM: 137 mmol/L (ref 135–145)

## 2015-01-09 NOTE — Telephone Encounter (Signed)
Pt called stating he dropped of medical records from [facility] in [place] He wanted to know if he could a referral to mental health in [place]

## 2015-01-09 NOTE — ED Notes (Signed)
Delay on lab draw, pt in exray 

## 2015-01-09 NOTE — Telephone Encounter (Signed)
[facility] - Client TELEPHONE ADVICE RECORD [facility] Patient Name: Randy FordyceROGER Barnes DOB: Dec 25, 1960 Initial Comment Caller states he is coughing up blood. PT does have ulcer, not sure if it is that. Nurse Assessment Nurse: Izola PriceMyers, RN, Cala BradfordKimberly Date/Time Randy Barnes(Eastern Time): 01/09/2015 11:15:23 AM Confirm and document reason for call. If symptomatic, describe symptoms. ---Caller states he is coughing up blood. PT does have ulcer, not sure if it is that. has had pneumonia and taking Advair. Took antibiotics and was about 3rd of Oct. Didn't see a lot of improvement but some. also on steroids but only took 3 or 4 days. started getting better and then caught another cold or something and is now bad again. Coughing but also having abd pain and has ulcer. phlegm has quite a bit of blood. coughing so much that he looses his breath. Tested positive years ago and has been tested and was told it was ok but hasn't been tested for a while. Has the patient traveled out of the country within the last 30 days? ---No Does the patient have any new or worsening symptoms? ---Yes Will a triage be completed? ---Yes Related visit to physician within the last 2 weeks? ---No Does the PT have any chronic conditions? (i.e. diabetes, asthma, etc.) ---Yes List chronic conditions. 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

## 2015-01-09 NOTE — ED Notes (Signed)
Per pt, states he has been treated for URI-still having cough-states when he coughs deep, sputum has some blood in it

## 2015-01-10 NOTE — Telephone Encounter (Signed)
Spoke with patient. He said he went to ER, but ended up leaving because he got tired of waiting and having others put ahead of him. He did have xrays done though. I scheduled a follow up for him tomorrow.

## 2015-01-10 NOTE — Telephone Encounter (Signed)
plz call for f/u after ER visit for hemoptysis. We will refer to psych.

## 2015-01-11 ENCOUNTER — Encounter: Payer: Self-pay | Admitting: Family Medicine

## 2015-01-11 ENCOUNTER — Ambulatory Visit (INDEPENDENT_AMBULATORY_CARE_PROVIDER_SITE_OTHER): Payer: Self-pay | Admitting: Family Medicine

## 2015-01-11 VITALS — BP 160/90 | HR 84 | Temp 98.1°F | Wt 282.5 lb

## 2015-01-11 DIAGNOSIS — R042 Hemoptysis: Secondary | ICD-10-CM

## 2015-01-11 DIAGNOSIS — F331 Major depressive disorder, recurrent, moderate: Secondary | ICD-10-CM | POA: Insufficient documentation

## 2015-01-11 DIAGNOSIS — F410 Panic disorder [episodic paroxysmal anxiety] without agoraphobia: Secondary | ICD-10-CM

## 2015-01-11 DIAGNOSIS — IMO0002 Reserved for concepts with insufficient information to code with codable children: Secondary | ICD-10-CM

## 2015-01-11 DIAGNOSIS — J439 Emphysema, unspecified: Secondary | ICD-10-CM

## 2015-01-11 DIAGNOSIS — E1140 Type 2 diabetes mellitus with diabetic neuropathy, unspecified: Secondary | ICD-10-CM

## 2015-01-11 DIAGNOSIS — E1165 Type 2 diabetes mellitus with hyperglycemia: Secondary | ICD-10-CM

## 2015-01-11 DIAGNOSIS — I10 Essential (primary) hypertension: Secondary | ICD-10-CM

## 2015-01-11 DIAGNOSIS — F064 Anxiety disorder due to known physiological condition: Secondary | ICD-10-CM

## 2015-01-11 MED ORDER — GLIPIZIDE 5 MG PO TABS: 5.0000 mg | ORAL_TABLET | Freq: Two times a day (BID) | ORAL | Status: DC

## 2015-01-11 NOTE — Progress Notes (Signed)
BP 160/90 mmHg  Pulse 84  Temp(Src) 98.1 F (36.7 C) (Oral)  Wt 282 lb 8 oz (128.141 kg)   CC: hemoptysis  Subjective:    Patient ID: Randy Barnes David, male    DOB: 1960-09-27, 54 y.o.   MRN: [id number]  HPI: Randy Barnes Delorey is a 54 y.o. male presenting on 01/11/2015 for Follow-up   Established with us 2 wks ago. See recent phone note - called in 01/09/2015 with coughing up blood after coughing fit, advised to be evaluated at ER, had stable xray then left due to prolonged wait. Clarified this was blood tinged sputum first thing in am when he woke up, no clots or blood. Persistent coughing but overall improving. Wheezing improving. Dyspnea improving.   Recent COPD exacerbation treated with prednisone/levaquin courses. 1 ppd smoker - has bought nicorette gum.   MDD, anxiety with panic attacks. Records from [facility] reviewed. Last visit we started amitriptyline 25-50mg  nightly. This is helping him sleep better. Cymbalta trial ineffective earlier this year. Also previously on xanax (helped) and klonopin (helped) and hydroxyzine (didn't help) prn anxiety attacks. [place]. Records from [facility] returned to patient today.   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 Treated with 6 mo pill.   Lab Results  Component Value Date   HGBA1C 8.4* 09/16/2014    DM - states he's not taking NPH regularly because he will notice sugars drop. Checks sugars twice daily, well controlled.   bp elevated today - didn't take lisinopril today.  Relevant past medical, surgical, family and social history reviewed and updated as indicated. Interim medical history since our last visit reviewed. Allergies and medications reviewed and updated. Current Outpatient Prescriptions on File Prior to Visit  Medication Sig  . albuterol (VENTOLIN HFA) 108 (90 BASE) MCG/ACT inhaler Inhale 2 puffs into the lungs every 6 (six) hours as needed for wheezing or shortness of breath.  Marland Kitchen. amitriptyline  (ELAVIL) 50 MG tablet Take 0.5-1 tablets (25-50 mg total) by mouth at bedtime.  . gabapentin (NEURONTIN) 800 MG tablet Take 1 tablet (800 mg total) by mouth 4 (four) times daily.  Marland Kitchen. ibuprofen (ADVIL,MOTRIN) 800 MG tablet Take 800 mg by mouth every 8 (eight) hours as needed.  . insulin NPH Human (HUMULIN N,NOVOLIN N) 100 UNIT/ML injection Inject 10 Units into the skin 2 (two) times daily before a meal.   . Insulin Syringes, Disposable, U-100 1 ML MISC Check blood sugar before meals and at bedtime.  Marland Kitchen. lisinopril (PRINIVIL,ZESTRIL) 40 MG tablet Take 40 mg by mouth daily.  . metFORMIN (GLUCOPHAGE) 1000 MG tablet Take 1 tablet (1,000 mg total) by mouth 2 (two) times daily with a meal.  . omeprazole (PRILOSEC) 20 MG capsule Take 1 capsule (20 mg total) by mouth 2 (two) times daily before a meal.  . tamsulosin (FLOMAX) 0.4 MG CAPS capsule Take twice a day   No current facility-administered medications on file prior to visit.    Review of Systems Per HPI unless specifically indicated in ROS section     Objective:    BP 160/90 mmHg  Pulse 84  Temp(Src) 98.1 F (36.7 C) (Oral)  Wt 282 lb 8 oz (128.141 kg)  Wt Readings from Last 3 Encounters:  01/11/15 282 lb 8 oz (128.141 kg)  12/26/14 284 lb 4 oz (128.935 kg)  11/13/14 296 lb 8 oz (134.492 kg)    Physical Exam  Constitutional: He appears well-developed and well-nourished. No distress.  Eyes:  L  eye lateral strabismus  Cardiovascular: Normal rate, regular rhythm, normal heart sounds and intact distal pulses.   No murmur heard. Pulmonary/Chest: Effort normal and breath sounds normal. No respiratory distress. He has no wheezes. He has no rales.  Coarse R sided breath sounds  Musculoskeletal: He exhibits edema (tr).  Nursing note and vitals reviewed.  Results for orders placed or performed during the hospital encounter of 01/09/15  Basic metabolic panel  Result Value Ref Range   Sodium 137 135 - 145 mmol/L   Potassium 4.6 3.5 - 5.1  mmol/L   Chloride 105 101 - 111 mmol/L   CO2 24 22 - 32 mmol/L   Glucose, Bld 164 (H) 65 - 99 mg/dL   BUN 13 6 - 20 mg/dL   Creatinine, Ser 1.61 0.61 - 1.24 mg/dL   Calcium 9.4 8.9 - 09.6 mg/dL   GFR calc non Af Amer >60 >60 mL/min   GFR calc Af Amer >60 >60 mL/min   Anion gap 8 5 - 15  CBC  Result Value Ref Range   WBC 8.4 4.0 - 10.5 K/uL   RBC 4.98 4.22 - 5.81 MIL/uL   Hemoglobin 16.4 13.0 - 17.0 g/dL   HCT 04.5 40.9 - 81.1 %   MCV 98.0 78.0 - 100.0 fL   MCH 32.9 26.0 - 34.0 pg   MCHC 33.6 30.0 - 36.0 g/dL   RDW 91.4 78.2 - 95.6 %   Platelets 111 (L) 150 - 400 K/uL   CHEST 2 VIEW COMPARISON: None. FINDINGS: There is no edema or consolidation. The heart size and pulmonary vascularity are normal. No adenopathy. There is degenerative change in the thoracic spine. IMPRESSION: No edema or consolidation. Electronically Signed  By: Bretta Bang III M.Barnes.  On: 01/09/2015 15:26    Assessment & Plan:   Problem List Items Addressed This Visit    Uncontrolled type 2 diabetes with neuropathy (HCC) - Primary    Significant improvement on last blood work - A1c ~5.9%. rec stop insulin, increase glipizide to  BID, monitor sugars at home and reassess at 2wk f/u visit. Pt agrees with plan.      Relevant Medications   glipiZIDE (GLUCOTROL) 5 MG tablet   MDD (major depressive disorder), recurrent episode, moderate (HCC)    Sleeping better on amitriptyline. Continue. Requests referral to establish with psych - placed today.      Relevant Orders   Ambulatory referral to Psychiatry   Hemoptysis    Isolated episode of blood tinged sputum after coughing fit while under treatment for COPD exacerbation. Anticipate burst capillary that has now resolved. Advised monitor for and update [place] if recurrence.      Essential hypertension    bp elevated today - pt attributes to not taking am meds - will take lisinopril  when he gets home today.      Emphysema of lung (HCC)     Compliant with advair daily and albuterol prn. Continue. Lungs clear today.      Relevant Medications   Fluticasone-Salmeterol (ADVAIR DISKUS IN)   Anxiety disorder due to general medical condition with panic attack    [facility] records reviewed and returned to patient. ?PTSD dx, anxiety with attacks. Pt states benzos were helpful previously but not hydroxyzine. Did not prescribe anxiolytic today. Continue amitriptyline nightly      Relevant Orders   Ambulatory referral to Psychiatry       Follow up plan: No Follow-up on file.

## 2015-01-11 NOTE — Assessment & Plan Note (Signed)
Isolated episode of blood tinged sputum after coughing fit while under treatment for COPD exacerbation. Anticipate burst capillary that has now resolved. Advised monitor for and update us if recurrence.

## 2015-01-11 NOTE — Assessment & Plan Note (Signed)
Compliant with advair daily and albuterol prn. Continue. Lungs clear today.

## 2015-01-11 NOTE — Assessment & Plan Note (Addendum)
Significant improvement on last blood work - A1c ~5.9%. rec stop insulin, increase glipizide to 5mg  BID, monitor sugars at home and reassess at 2wk f/u visit. Pt agrees with plan.

## 2015-01-11 NOTE — Progress Notes (Signed)
Pre visit review using our clinic review tool, if applicable. No additional management support is needed unless otherwise documented below in the visit note. 

## 2015-01-11 NOTE — Assessment & Plan Note (Signed)
[facility] records reviewed and returned to patient. ?PTSD dx, anxiety with attacks. Pt states benzos were helpful previously but not hydroxyzine. Did not prescribe anxiolytic today. Continue amitriptyline nightly

## 2015-01-11 NOTE — Patient Instructions (Addendum)
Let's hold insulin and increase glipizide to 5mg  twice daily with meals. Monitor sugars over next 2 weeks and we will see how we're doing next time. Pass by Marion's office to schedule appointment with psychiatry. I think blood tinged sputum was from burst capillary - if bleeding returns let us know. Keep appointment in 2 weeks for follow up diabetes/sugars.

## 2015-01-11 NOTE — Assessment & Plan Note (Signed)
Sleeping better on amitriptyline. Continue. Requests referral to establish with psych - placed today.

## 2015-01-11 NOTE — Assessment & Plan Note (Signed)
bp elevated today - pt attributes to not taking am meds - will take lisinopril 40mg  when he gets home today.

## 2015-01-26 ENCOUNTER — Ambulatory Visit (INDEPENDENT_AMBULATORY_CARE_PROVIDER_SITE_OTHER): Payer: Self-pay | Admitting: Family Medicine

## 2015-01-26 ENCOUNTER — Encounter: Payer: Self-pay | Admitting: Family Medicine

## 2015-01-26 ENCOUNTER — Ambulatory Visit (INDEPENDENT_AMBULATORY_CARE_PROVIDER_SITE_OTHER)
Admission: RE | Admit: 2015-01-26 | Discharge: 2015-01-26 | Disposition: A | Discharge status: Home / Self Care | Payer: Self-pay | Source: Ambulatory Visit | Attending: Family Medicine | Admitting: Family Medicine

## 2015-01-26 ENCOUNTER — Telehealth (HOSPITAL_COMMUNITY): Payer: Self-pay | Admitting: *Deleted

## 2015-01-26 VITALS — BP 160/80 | HR 101 | Temp 98.0°F | Wt 290.8 lb

## 2015-01-26 DIAGNOSIS — E1140 Type 2 diabetes mellitus with diabetic neuropathy, unspecified: Secondary | ICD-10-CM

## 2015-01-26 DIAGNOSIS — F17200 Nicotine dependence, unspecified, uncomplicated: Secondary | ICD-10-CM | POA: Insufficient documentation

## 2015-01-26 DIAGNOSIS — Z7289 Other problems related to lifestyle: Secondary | ICD-10-CM

## 2015-01-26 DIAGNOSIS — I10 Essential (primary) hypertension: Secondary | ICD-10-CM

## 2015-01-26 DIAGNOSIS — M545 Other chronic pain: Secondary | ICD-10-CM

## 2015-01-26 DIAGNOSIS — G8929 Other chronic pain: Secondary | ICD-10-CM

## 2015-01-26 DIAGNOSIS — E785 Hyperlipidemia, unspecified: Secondary | ICD-10-CM

## 2015-01-26 DIAGNOSIS — F331 Major depressive disorder, recurrent, moderate: Secondary | ICD-10-CM

## 2015-01-26 DIAGNOSIS — Z23 Encounter for immunization: Secondary | ICD-10-CM

## 2015-01-26 DIAGNOSIS — E1165 Type 2 diabetes mellitus with hyperglycemia: Secondary | ICD-10-CM

## 2015-01-26 DIAGNOSIS — Z720 Tobacco use: Secondary | ICD-10-CM

## 2015-01-26 DIAGNOSIS — IMO0002 Reserved for concepts with insufficient information to code with codable children: Secondary | ICD-10-CM

## 2015-01-26 DIAGNOSIS — Z789 Other specified health status: Secondary | ICD-10-CM | POA: Insufficient documentation

## 2015-01-26 LAB — COMPREHENSIVE METABOLIC PANEL
ALT: 24 U/L (ref 0–53)
AST: 33 U/L (ref 0–37)
Albumin: 4.2 g/dL (ref 3.5–5.2)
Alkaline Phosphatase: 68 U/L (ref 39–117)
BUN: 14 mg/dL (ref 6–23)
CALCIUM: 10.1 mg/dL (ref 8.4–10.5)
CHLORIDE: 103 meq/L (ref 96–112)
CO2: 23 meq/L (ref 19–32)
CREATININE: 0.98 mg/dL (ref 0.40–1.50)
GFR: 85.22 mL/min (ref >60.00)
Glucose, Bld: 89 mg/dL (ref 70–99)
Potassium: 4.6 mEq/L (ref 3.5–5.1)
SODIUM: 138 meq/L (ref 135–145)
Total Bilirubin: 0.6 mg/dL (ref 0.2–1.2)
Total Protein: 7.2 g/dL (ref 6.0–8.3)

## 2015-01-26 LAB — LDL CHOLESTEROL, DIRECT: LDL DIRECT: 125 mg/dL

## 2015-01-26 LAB — HEMOGLOBIN A1C: Hgb A1c MFr Bld: 5.5 % (ref 4.6–6.5)

## 2015-01-26 MED ORDER — TRAMADOL HCL 50 MG PO TABS: 50.0000 mg | ORAL_TABLET | Freq: Two times a day (BID) | ORAL | Status: DC | PRN

## 2015-01-26 MED ORDER — AMITRIPTYLINE HCL 100 MG PO TABS: 100.0000 mg | ORAL_TABLET | Freq: Every day | ORAL | Status: DC

## 2015-01-26 MED ORDER — AMLODIPINE BESYLATE 5 MG PO TABS: 5.0000 mg | ORAL_TABLET | Freq: Every day | ORAL | Status: DC

## 2015-01-26 NOTE — Assessment & Plan Note (Signed)
Encouraged cessation.
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
## 2015-01-26 NOTE — Assessment & Plan Note (Signed)
Persistent hypoglycemia despite stopping NPH. Will stop glipizide and monitor for improvement. Check A1c today. Persistent neuropathy despite high dose gabapentin.

## 2015-01-26 NOTE — Assessment & Plan Note (Signed)
Persistently elevated - start amlodipine 5mg  daily. Pt agrees with plan.

## 2015-01-26 NOTE — Assessment & Plan Note (Signed)
With insomnia. Increase amitritpyline to 100mg  nightly. Monitor dry mouth

## 2015-01-26 NOTE — Assessment & Plan Note (Signed)
Check d LDL 

## 2015-01-26 NOTE — Assessment & Plan Note (Signed)
Encouraged decreased use

## 2015-01-26 NOTE — Addendum Note (Signed)
Addended by: Shon MilletWATLINGTON, Allena Pietila M on: 01/26/2015 11:05 AM   Modules accepted: Orders

## 2015-01-26 NOTE — Progress Notes (Signed)
Pre visit review using our clinic review tool, if applicable. No additional management support is needed unless otherwise documented below in the visit note. 

## 2015-01-26 NOTE — Patient Instructions (Addendum)
Flu and pneumonia shots today.  Start taking mucinex for cough.  Increase amitriptyline to 100mg  nightly.  Blood work today. Xray of lower back today. Trial tramadol for pain - caution it can make you sleepy.  Return as needed or in 3 months for follow up.

## 2015-01-26 NOTE — Assessment & Plan Note (Signed)
Longstanding issue presumed from DDD. Check lumbar films today, continue ibuprofen 800mg  bid, start tramadol for breakthrough pain. Advised to avoid EtOH when taking med.

## 2015-01-28 ENCOUNTER — Encounter: Payer: Self-pay | Admitting: Family Medicine

## 2015-01-28 DIAGNOSIS — I700 Atherosclerosis of aorta: Secondary | ICD-10-CM | POA: Insufficient documentation

## 2015-01-28 DIAGNOSIS — I708 Atherosclerosis of other arteries: Secondary | ICD-10-CM | POA: Insufficient documentation

## 2015-01-31 ENCOUNTER — Other Ambulatory Visit: Payer: Self-pay | Admitting: Family Medicine

## 2015-01-31 DIAGNOSIS — G8929 Other chronic pain: Secondary | ICD-10-CM

## 2015-01-31 DIAGNOSIS — M545 Other chronic pain: Secondary | ICD-10-CM

## 2015-02-07 ENCOUNTER — Ambulatory Visit (HOSPITAL_COMMUNITY)
Admission: RE | Admit: 2015-02-07 | Discharge: 2015-02-07 | Disposition: A | Discharge status: Home / Self Care | Payer: Self-pay | Source: Ambulatory Visit | Attending: Family Medicine | Admitting: Family Medicine

## 2015-02-07 DIAGNOSIS — G8929 Other chronic pain: Secondary | ICD-10-CM | POA: Insufficient documentation

## 2015-02-07 DIAGNOSIS — M545 Other chronic pain: Secondary | ICD-10-CM

## 2015-02-14 ENCOUNTER — Other Ambulatory Visit: Payer: Self-pay | Admitting: Family Medicine

## 2015-02-14 DIAGNOSIS — G8929 Other chronic pain: Secondary | ICD-10-CM

## 2015-02-14 DIAGNOSIS — M545 Other chronic pain: Secondary | ICD-10-CM

## 2015-02-15 ENCOUNTER — Encounter: Payer: Self-pay | Admitting: *Deleted

## 2015-02-21 ENCOUNTER — Other Ambulatory Visit: Payer: Self-pay | Admitting: *Deleted

## 2015-02-21 MED ORDER — TAMSULOSIN HCL 0.4 MG PO CAPS: ORAL_CAPSULE | ORAL | Status: DC

## 2015-02-21 NOTE — Telephone Encounter (Signed)
Pt wanted to know if [facility]. Advised pt [facility]amsulosin was [facility]. Pt voiced understanding.

## 2015-02-22 ENCOUNTER — Other Ambulatory Visit: Payer: Self-pay

## 2015-02-22 MED ORDER — LISINOPRIL 40 MG PO TABS: 40.0000 mg | ORAL_TABLET | Freq: Every day | ORAL | Status: DC

## 2015-02-22 MED ORDER — METFORMIN HCL 1000 MG PO TABS: 1000.0000 mg | ORAL_TABLET | Freq: Two times a day (BID) | ORAL | Status: DC

## 2015-02-22 NOTE — Telephone Encounter (Signed)
Pt left v/m requesting refill gabapentin, lisinopril and metformin to [facility]; Mapp program. Pt last seen 01/26/15. Pt has f/u appt 04/2015. Lisinopril and metformin refilled. Please advise about gabapentin.

## 2015-02-23 MED ORDER — GABAPENTIN 800 MG PO TABS: 800.0000 mg | ORAL_TABLET | Freq: Four times a day (QID) | ORAL | Status: DC

## 2015-04-04 ENCOUNTER — Encounter (HOSPITAL_COMMUNITY): Payer: Self-pay | Admitting: Psychiatry

## 2015-04-04 ENCOUNTER — Ambulatory Visit (INDEPENDENT_AMBULATORY_CARE_PROVIDER_SITE_OTHER): Payer: No Typology Code available for payment source | Admitting: Psychiatry

## 2015-04-04 VITALS — BP 131/75 | HR 90 | Ht 76.0 in | Wt 267.6 lb

## 2015-04-04 DIAGNOSIS — F1210 Cannabis abuse, uncomplicated: Secondary | ICD-10-CM

## 2015-04-04 DIAGNOSIS — F1010 Alcohol abuse, uncomplicated: Secondary | ICD-10-CM

## 2015-04-04 DIAGNOSIS — F4310 Post-traumatic stress disorder, unspecified: Secondary | ICD-10-CM

## 2015-04-04 DIAGNOSIS — F3131 Bipolar disorder, current episode depressed, mild: Secondary | ICD-10-CM

## 2015-04-04 MED ORDER — LAMOTRIGINE 25 MG PO TABS: ORAL_TABLET | ORAL | Status: DC
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
## 2015-04-25 ENCOUNTER — Ambulatory Visit (HOSPITAL_COMMUNITY): Payer: Self-pay | Admitting: Psychiatry

## 2015-04-28 ENCOUNTER — Encounter: Payer: Self-pay | Admitting: Family Medicine

## 2015-04-28 ENCOUNTER — Ambulatory Visit (INDEPENDENT_AMBULATORY_CARE_PROVIDER_SITE_OTHER): Payer: Self-pay | Admitting: Family Medicine

## 2015-04-28 VITALS — BP 120/80 | HR 86 | Temp 97.5°F | Wt 276.0 lb

## 2015-04-28 DIAGNOSIS — I70299 Other atherosclerosis of native arteries of extremities, unspecified extremity: Secondary | ICD-10-CM

## 2015-04-28 DIAGNOSIS — E1165 Type 2 diabetes mellitus with hyperglycemia: Secondary | ICD-10-CM

## 2015-04-28 DIAGNOSIS — IMO0002 Reserved for concepts with insufficient information to code with codable children: Secondary | ICD-10-CM

## 2015-04-28 DIAGNOSIS — Z720 Tobacco use: Secondary | ICD-10-CM

## 2015-04-28 DIAGNOSIS — J439 Emphysema, unspecified: Secondary | ICD-10-CM

## 2015-04-28 DIAGNOSIS — Z789 Other specified health status: Secondary | ICD-10-CM

## 2015-04-28 DIAGNOSIS — E785 Hyperlipidemia, unspecified: Secondary | ICD-10-CM

## 2015-04-28 DIAGNOSIS — I700 Atherosclerosis of aorta: Secondary | ICD-10-CM

## 2015-04-28 DIAGNOSIS — E1140 Type 2 diabetes mellitus with diabetic neuropathy, unspecified: Secondary | ICD-10-CM

## 2015-04-28 DIAGNOSIS — F064 Anxiety disorder due to known physiological condition: Secondary | ICD-10-CM

## 2015-04-28 DIAGNOSIS — F410 Panic disorder [episodic paroxysmal anxiety] without agoraphobia: Secondary | ICD-10-CM

## 2015-04-28 DIAGNOSIS — F17200 Nicotine dependence, unspecified, uncomplicated: Secondary | ICD-10-CM

## 2015-04-28 DIAGNOSIS — I708 Atherosclerosis of other arteries: Secondary | ICD-10-CM

## 2015-04-28 DIAGNOSIS — Z7289 Other problems related to lifestyle: Secondary | ICD-10-CM

## 2015-04-28 DIAGNOSIS — I10 Essential (primary) hypertension: Secondary | ICD-10-CM

## 2015-04-28 LAB — HEMOGLOBIN A1C: Hgb A1c MFr Bld: 5.9 % (ref 4.6–6.5)

## 2015-04-28 MED ORDER — BLOOD GLUCOSE TEST VI STRP: ORAL_STRIP | Status: DC

## 2015-04-28 NOTE — Assessment & Plan Note (Signed)
Congratulated on decreased use. Encouraged continue this.

## 2015-04-28 NOTE — Assessment & Plan Note (Signed)
Not on statin. FLP stable.

## 2015-04-28 NOTE — Assessment & Plan Note (Signed)
Precontemplative. Continue to encourage smoking cessation

## 2015-04-28 NOTE — Assessment & Plan Note (Signed)
Appreciate assistance by psych. Continue amitriptyline  nightly.

## 2015-04-28 NOTE — Assessment & Plan Note (Signed)
Chronic, stable. Continue lisinopril  daily. Stop amlodipine as having trouble affording medications.

## 2015-04-28 NOTE — Assessment & Plan Note (Signed)
Mildly elevated triglycerides on latest check off chol med.

## 2015-04-28 NOTE — Assessment & Plan Note (Signed)
Encouraged continue advair daily.

## 2015-04-28 NOTE — Assessment & Plan Note (Signed)
Has been able to come off insulin and sulfonylurea. Check labs today. Continue gabapentin and ibuprofen.
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
## 2015-04-28 NOTE — Progress Notes (Signed)
Pre visit review using our clinic review tool, if applicable. No additional management support is needed unless otherwise documented below in the visit note. 

## 2015-04-28 NOTE — Patient Instructions (Addendum)
Pass by Randy Barnes's office to see if we can reschedule back doctor.  labwork today Good to see you, call us with questions Return as needed or in 3-4 months for physical.

## 2015-04-29 ENCOUNTER — Other Ambulatory Visit: Payer: Self-pay | Admitting: Family Medicine

## 2015-04-29 MED ORDER — METFORMIN HCL 500 MG PO TABS: 500.0000 mg | ORAL_TABLET | Freq: Two times a day (BID) | ORAL | Status: DC

## 2015-05-02 ENCOUNTER — Ambulatory Visit (HOSPITAL_COMMUNITY): Payer: Self-pay | Admitting: Psychiatry

## 2015-05-09 ENCOUNTER — Encounter (HOSPITAL_COMMUNITY): Payer: Self-pay | Admitting: Psychiatry

## 2015-05-09 ENCOUNTER — Ambulatory Visit (INDEPENDENT_AMBULATORY_CARE_PROVIDER_SITE_OTHER): Payer: No Typology Code available for payment source | Admitting: Psychiatry

## 2015-05-09 VITALS — BP 131/88 | HR 98 | Ht 75.0 in | Wt 268.6 lb

## 2015-05-09 DIAGNOSIS — F3131 Bipolar disorder, current episode depressed, mild: Secondary | ICD-10-CM

## 2015-05-09 DIAGNOSIS — F1210 Cannabis abuse, uncomplicated: Secondary | ICD-10-CM

## 2015-05-09 DIAGNOSIS — F4310 Post-traumatic stress disorder, unspecified: Secondary | ICD-10-CM

## 2015-05-09 DIAGNOSIS — F1010 Alcohol abuse, uncomplicated: Secondary | ICD-10-CM

## 2015-05-09 MED ORDER — LAMOTRIGINE 100 MG PO TABS: 100.0000 mg | ORAL_TABLET | Freq: Every day | ORAL | Status: DC
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
## 2015-05-15 ENCOUNTER — Telehealth: Payer: Self-pay

## 2015-05-15 NOTE — Telephone Encounter (Signed)
Pt left v/m requesting cb about medications for [facility]. Advised pt [facility] should have available refills; pt will ck with health dept and have them contact [facility] if they do not have refills.

## 2015-05-16 ENCOUNTER — Ambulatory Visit (INDEPENDENT_AMBULATORY_CARE_PROVIDER_SITE_OTHER): Payer: No Typology Code available for payment source | Admitting: Clinical

## 2015-05-16 ENCOUNTER — Encounter (HOSPITAL_COMMUNITY): Payer: Self-pay | Admitting: Clinical

## 2015-05-16 DIAGNOSIS — F1190 Opioid use, unspecified, uncomplicated: Secondary | ICD-10-CM

## 2015-05-16 DIAGNOSIS — F3131 Bipolar disorder, current episode depressed, mild: Secondary | ICD-10-CM

## 2015-05-16 DIAGNOSIS — IMO0002 Reserved for concepts with insufficient information to code with codable children: Secondary | ICD-10-CM

## 2015-05-16 DIAGNOSIS — F1090 Alcohol use, unspecified, uncomplicated: Secondary | ICD-10-CM

## 2015-05-16 DIAGNOSIS — F1210 Cannabis abuse, uncomplicated: Secondary | ICD-10-CM

## 2015-05-16 DIAGNOSIS — F4310 Post-traumatic stress disorder, unspecified: Secondary | ICD-10-CM

## 2015-05-16 DIAGNOSIS — F1099 Alcohol use, unspecified with unspecified alcohol-induced disorder: Secondary | ICD-10-CM

## 2015-05-16 DIAGNOSIS — F1121 Opioid dependence, in remission: Secondary | ICD-10-CM

## 2015-05-16 DIAGNOSIS — F1290 Cannabis use, unspecified, uncomplicated: Secondary | ICD-10-CM
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
## 2015-05-23 ENCOUNTER — Other Ambulatory Visit: Payer: Self-pay | Admitting: *Deleted

## 2015-05-23 MED ORDER — ALBUTEROL SULFATE HFA 108 (90 BASE) MCG/ACT IN AERS: 2.0000 | INHALATION_SPRAY | Freq: Four times a day (QID) | RESPIRATORY_TRACT | Status: DC | PRN

## 2015-05-23 MED ORDER — FLUTICASONE-SALMETEROL 500-50 MCG/DOSE IN AEPB: 1.0000 | INHALATION_SPRAY | Freq: Two times a day (BID) | RESPIRATORY_TRACT | Status: DC

## 2015-06-01 ENCOUNTER — Other Ambulatory Visit (HOSPITAL_COMMUNITY): Payer: Self-pay

## 2015-06-01 DIAGNOSIS — F3131 Bipolar disorder, current episode depressed, mild: Secondary | ICD-10-CM

## 2015-06-01 DIAGNOSIS — F4310 Post-traumatic stress disorder, unspecified: Secondary | ICD-10-CM

## 2015-06-01 MED ORDER — LAMOTRIGINE 100 MG PO TABS: 100.0000 mg | ORAL_TABLET | Freq: Every day | ORAL | Status: DC

## 2015-06-01 NOTE — Telephone Encounter (Signed)
Fax received for refill of Lamictal, patient was last here 2/21 and has a f/u on 3/27 - okay to refill? Please advise, thank you.

## 2015-06-01 NOTE — Telephone Encounter (Signed)
Per Dr. Lolly MustacheArfeen, this is okay to refill. I sent it to the pharmacy and called the patient

## 2015-06-02 ENCOUNTER — Other Ambulatory Visit (HOSPITAL_COMMUNITY): Payer: Self-pay

## 2015-06-02 DIAGNOSIS — F3131 Bipolar disorder, current episode depressed, mild: Secondary | ICD-10-CM

## 2015-06-02 DIAGNOSIS — F4310 Post-traumatic stress disorder, unspecified: Secondary | ICD-10-CM

## 2015-06-02 MED ORDER — LAMOTRIGINE 100 MG PO TABS: 100.0000 mg | ORAL_TABLET | Freq: Every day | ORAL | Status: DC

## 2015-06-12 ENCOUNTER — Ambulatory Visit (INDEPENDENT_AMBULATORY_CARE_PROVIDER_SITE_OTHER): Payer: No Typology Code available for payment source | Admitting: Psychiatry

## 2015-06-12 ENCOUNTER — Encounter (HOSPITAL_COMMUNITY): Payer: Self-pay | Admitting: Psychiatry

## 2015-06-12 VITALS — BP 122/66 | HR 95 | Ht 75.0 in | Wt 269.6 lb

## 2015-06-12 DIAGNOSIS — F3131 Bipolar disorder, current episode depressed, mild: Secondary | ICD-10-CM

## 2015-06-12 MED ORDER — ARIPIPRAZOLE 5 MG PO TABS: 5.0000 mg | ORAL_TABLET | Freq: Every day | ORAL | Status: DC
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
## 2015-06-13 ENCOUNTER — Ambulatory Visit (INDEPENDENT_AMBULATORY_CARE_PROVIDER_SITE_OTHER): Payer: No Typology Code available for payment source | Admitting: Clinical

## 2015-06-13 ENCOUNTER — Encounter: Payer: Self-pay | Admitting: Family Medicine

## 2015-06-13 ENCOUNTER — Ambulatory Visit (INDEPENDENT_AMBULATORY_CARE_PROVIDER_SITE_OTHER): Payer: No Typology Code available for payment source | Admitting: Family Medicine

## 2015-06-13 VITALS — BP 128/84 | HR 93 | Temp 98.5°F | Wt 272.5 lb

## 2015-06-13 DIAGNOSIS — F17200 Nicotine dependence, unspecified, uncomplicated: Secondary | ICD-10-CM

## 2015-06-13 DIAGNOSIS — N529 Male erectile dysfunction, unspecified: Secondary | ICD-10-CM | POA: Insufficient documentation

## 2015-06-13 DIAGNOSIS — J439 Emphysema, unspecified: Secondary | ICD-10-CM

## 2015-06-13 DIAGNOSIS — F4310 Post-traumatic stress disorder, unspecified: Secondary | ICD-10-CM

## 2015-06-13 DIAGNOSIS — F1099 Alcohol use, unspecified with unspecified alcohol-induced disorder: Secondary | ICD-10-CM

## 2015-06-13 DIAGNOSIS — R059 Cough, unspecified: Secondary | ICD-10-CM | POA: Insufficient documentation

## 2015-06-13 DIAGNOSIS — R05 Cough: Secondary | ICD-10-CM

## 2015-06-13 DIAGNOSIS — F1210 Cannabis abuse, uncomplicated: Secondary | ICD-10-CM

## 2015-06-13 DIAGNOSIS — F1290 Cannabis use, unspecified, uncomplicated: Secondary | ICD-10-CM

## 2015-06-13 DIAGNOSIS — F1190 Opioid use, unspecified, uncomplicated: Secondary | ICD-10-CM

## 2015-06-13 DIAGNOSIS — F3131 Bipolar disorder, current episode depressed, mild: Secondary | ICD-10-CM

## 2015-06-13 DIAGNOSIS — Z720 Tobacco use: Secondary | ICD-10-CM

## 2015-06-13 DIAGNOSIS — F1121 Opioid dependence, in remission: Secondary | ICD-10-CM

## 2015-06-13 DIAGNOSIS — H269 Unspecified cataract: Secondary | ICD-10-CM

## 2015-06-13 DIAGNOSIS — IMO0002 Reserved for concepts with insufficient information to code with codable children: Secondary | ICD-10-CM

## 2015-06-13 MED ORDER — ALBUTEROL SULFATE (2.5 MG/3ML) 0.083% IN NEBU
2.5000 mg | INHALATION_SOLUTION | Freq: Once | RESPIRATORY_TRACT | Status: AC
  Administered 2015-06-13: 2.5 mg via RESPIRATORY_TRACT

## 2015-06-13 MED ORDER — SILDENAFIL CITRATE 100 MG PO TABS: 50.0000 mg | ORAL_TABLET | Freq: Every day | ORAL | Status: DC | PRN

## 2015-06-13 NOTE — Assessment & Plan Note (Signed)
Pt states he was recently seen by optometrist, who recommended referral to ophtho to discuss cataract surgery. I don't see obvious cataracts today but will refer regardless per pt request. H/o macular degeneration and prior L eye surgeries for what sounds like strabismus

## 2015-06-13 NOTE — Progress Notes (Signed)
BP 128/84 mmHg  Pulse 93  Temp(Src) 98.5 F (36.9 C) (Oral)  Wt 272 lb 8 oz (123.605 kg)  SpO2 95%   CC: continued cough  Subjective:    Patient ID: Randy Barnes, male    DOB: 1960/06/09, 55 y.o.   MRN: [id number]  HPI: Randy Barnes is a 55 y.o. male presenting on 06/13/2015 for Cough   Persistent productive cough with wheezing and dyspnea. Notices worse in am with significant sputum production. Unable to afford advair. Asks about alternatives. Using albuterol averages 2 puffs/day. Continues smoking > 1ppd. Restarted June 2016, had quit for 11 yrs prior. Has been exposed to mold.   mucinex caused cramping.  No fevers/chills, night sweats, weight loss.  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   Worsening vision s/p L eye surgery. Saw eye doctor a few weeks ago. Advised needed cataract surgery. Requests referral to ophtho to establish.  ED - ongoing trouble for years. Trouble obtaining and maintaining erection. Requests trial of med for this. Denies chest pain. Not on nitroglycerin.   Relevant past medical, surgical, family and social history reviewed and updated as indicated. Interim medical history since our last visit reviewed. Allergies and medications reviewed and updated. Current Outpatient Prescriptions on File Prior to Visit  Medication Sig  . albuterol (VENTOLIN HFA) 108 (90 Base) MCG/ACT inhaler Inhale 2 puffs into the lungs every 6 (six) hours as needed for wheezing or shortness of breath.  Marland Kitchen amitriptyline (ELAVIL) 100 MG tablet Take 1 tablet (100 mg total) by mouth at bedtime.  . gabapentin (NEURONTIN) 300 MG capsule Take 900 mg by mouth 3 (three) times daily.  . Glucose Blood (BLOOD GLUCOSE TEST STRIPS) STRP Use as directed  . ibuprofen (ADVIL,MOTRIN) 800 MG tablet Take 800 mg by mouth every 8 (eight) hours as needed.  Marland Kitchen lisinopril (PRINIVIL,ZESTRIL) 40 MG tablet Take 1 tablet (40 mg total) by mouth daily.  . metFORMIN (GLUCOPHAGE) 500 MG tablet Take 1  tablet (500 mg total) by mouth 2 (two) times daily with a meal.  . omeprazole (PRILOSEC) 20 MG capsule Take 1 capsule (20 mg total) by mouth 2 (two) times daily before a meal.  . tamsulosin (FLOMAX) 0.4 MG CAPS capsule Take twice a day  . ARIPiprazole (ABILIFY) 5 MG tablet Take 1 tablet (5 mg total) by mouth daily. (Patient not taking: Reported on 06/13/2015)  . traMADol (ULTRAM) 50 MG tablet Take 1 tablet (50 mg total) by mouth 2 (two) times daily as needed. (Patient not taking: Reported on 05/16/2015)   No current facility-administered medications on file prior to visit.    Review of Systems Per HPI unless specifically indicated in ROS section     Objective:    BP 128/84 mmHg  Pulse 93  Temp(Src) 98.5 F (36.9 C) (Oral)  Wt 272 lb 8 oz (123.605 kg)  SpO2 95%  Wt Readings from Last 3 Encounters:  06/13/15 272 lb 8 oz (123.605 kg)  06/12/15 269 lb 9.6 oz (122.29 kg)  05/09/15 268 lb 9.6 oz (121.836 kg)    Physical Exam  Constitutional: He appears well-developed and well-nourished. No distress.  HENT:  Mouth/Throat: Oropharynx is clear and moist. No oropharyngeal exudate.  Cardiovascular: Normal rate, regular rhythm, normal heart sounds and intact distal pulses.   No murmur heard. Pulmonary/Chest: Effort normal and breath sounds normal. No respiratory distress. He has no wheezes. He has no rales.  Coarse, rattling that clear with deep cough  Musculoskeletal: He exhibits  no edema.  Psychiatric: He has a normal mood and affect.  Nursing note and vitals reviewed.  Results for orders placed or performed in visit on 04/28/15  Hemoglobin A1c  Result Value Ref Range   Hgb A1c MFr Bld 5.9 4.6 - 6.5 %      Assessment & Plan:   Problem List Items Addressed This Visit    Smoker    Continued smoker. Continue to encourage cessation.      Erectile dysfunction    Trial viagra. Coupon provided today. Discussed side effects to monitor for.  Advised need to avoid nitro.       Emphysema of lung (HCC) - Primary    Has not been able to afford advair. Check spirometry today. Encouraged smoking cessation      Relevant Medications   albuterol (PROVENTIL) (2.5 MG/3ML) 0.083% nebulizer solution 2.5 mg (Start on 06/13/2015 11:45 AM)   Other Relevant Orders   Spirometry with Graph   Cough    Longstanding. Check spirometry today. No signs of active bacterial infection. Check gold quantiferon TB test next labwork.       Cataract    Pt states he was recently seen by optometrist, who recommended referral to ophtho to discuss cataract surgery. I don't see obvious cataracts today but will refer regardless per pt request. H/o macular degeneration and prior L eye surgeries for what sounds like strabismus      Relevant Orders   Ambulatory referral to Ophthalmology       Follow up plan: No Follow-up on file.

## 2015-06-13 NOTE — Assessment & Plan Note (Signed)
Longstanding. Check spirometry today. No signs of active bacterial infection. Check gold quantiferon TB test next labwork.

## 2015-06-13 NOTE — Progress Notes (Signed)
Pre visit review using our clinic review tool, if applicable. No additional management support is needed unless otherwise documented below in the visit note. 

## 2015-06-13 NOTE — Patient Instructions (Addendum)
Spirometry today - we will call you with results and plan.  We will refer you to Dr Dione BoozeGroat.  Trial viagra 1/2 tablet to start. No nitroglycerin while on this medication. Work on quitting smoking.

## 2015-06-13 NOTE — Assessment & Plan Note (Addendum)
Has not been able to afford advair. Check spirometry today. Encouraged smoking cessation

## 2015-06-13 NOTE — Assessment & Plan Note (Signed)
Trial viagra. Coupon provided today. Discussed side effects to monitor for.  Advised need to avoid nitro.

## 2015-06-13 NOTE — Assessment & Plan Note (Signed)
Continued smoker. Continue to encourage cessation.  

## 2015-06-19 ENCOUNTER — Encounter (HOSPITAL_COMMUNITY): Payer: Self-pay | Admitting: Clinical

## 2015-06-19 ENCOUNTER — Other Ambulatory Visit: Payer: Self-pay

## 2015-06-19 MED ORDER — LISINOPRIL 40 MG PO TABS: 40.0000 mg | ORAL_TABLET | Freq: Every day | ORAL | Status: DC

## 2015-06-19 NOTE — Telephone Encounter (Signed)
Pt request refill on lisinopril; pt lost lisinopril rx that was picked up last week; [facility] and pt was advised no more refills on lisinopril. Refills done per protocol. Pt will ck with pharmacy.
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
## 2015-07-10 ENCOUNTER — Ambulatory Visit (INDEPENDENT_AMBULATORY_CARE_PROVIDER_SITE_OTHER): Payer: No Typology Code available for payment source | Admitting: Clinical

## 2015-07-10 ENCOUNTER — Encounter (HOSPITAL_COMMUNITY): Payer: Self-pay | Admitting: Clinical

## 2015-07-10 DIAGNOSIS — F3131 Bipolar disorder, current episode depressed, mild: Secondary | ICD-10-CM

## 2015-07-10 DIAGNOSIS — F4310 Post-traumatic stress disorder, unspecified: Secondary | ICD-10-CM

## 2015-07-10 DIAGNOSIS — F1099 Alcohol use, unspecified with unspecified alcohol-induced disorder: Secondary | ICD-10-CM

## 2015-07-10 DIAGNOSIS — IMO0002 Reserved for concepts with insufficient information to code with codable children: Secondary | ICD-10-CM
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
## 2015-07-11 ENCOUNTER — Ambulatory Visit (HOSPITAL_COMMUNITY): Payer: Self-pay | Admitting: Psychiatry

## 2015-07-21 ENCOUNTER — Other Ambulatory Visit: Payer: Self-pay

## 2015-07-21 ENCOUNTER — Other Ambulatory Visit: Payer: Self-pay | Admitting: Family Medicine

## 2015-07-21 DIAGNOSIS — B1920 Unspecified viral hepatitis C without hepatic coma: Secondary | ICD-10-CM

## 2015-07-21 DIAGNOSIS — E785 Hyperlipidemia, unspecified: Secondary | ICD-10-CM

## 2015-07-21 DIAGNOSIS — Z125 Encounter for screening for malignant neoplasm of prostate: Secondary | ICD-10-CM

## 2015-07-21 DIAGNOSIS — E1140 Type 2 diabetes mellitus with diabetic neuropathy, unspecified: Secondary | ICD-10-CM

## 2015-07-21 DIAGNOSIS — I10 Essential (primary) hypertension: Secondary | ICD-10-CM

## 2015-07-21 DIAGNOSIS — N400 Enlarged prostate without lower urinary tract symptoms: Secondary | ICD-10-CM

## 2015-07-24 ENCOUNTER — Other Ambulatory Visit: Payer: Self-pay | Admitting: *Deleted

## 2015-07-24 ENCOUNTER — Ambulatory Visit (HOSPITAL_COMMUNITY): Payer: Self-pay | Admitting: Psychiatry

## 2015-07-24 MED ORDER — OMEPRAZOLE 20 MG PO CPDR: 20.0000 mg | DELAYED_RELEASE_CAPSULE | Freq: Two times a day (BID) | ORAL | Status: DC

## 2015-07-28 ENCOUNTER — Other Ambulatory Visit: Payer: Self-pay

## 2015-07-28 ENCOUNTER — Ambulatory Visit (INDEPENDENT_AMBULATORY_CARE_PROVIDER_SITE_OTHER): Payer: No Typology Code available for payment source | Admitting: Family Medicine

## 2015-07-28 ENCOUNTER — Encounter: Payer: Self-pay | Admitting: Family Medicine

## 2015-07-28 ENCOUNTER — Other Ambulatory Visit: Payer: Self-pay | Admitting: *Deleted

## 2015-07-28 VITALS — BP 144/88 | HR 80 | Temp 98.0°F | Ht 74.0 in | Wt 246.2 lb

## 2015-07-28 DIAGNOSIS — I10 Essential (primary) hypertension: Secondary | ICD-10-CM

## 2015-07-28 DIAGNOSIS — I708 Atherosclerosis of other arteries: Secondary | ICD-10-CM

## 2015-07-28 DIAGNOSIS — E1140 Type 2 diabetes mellitus with diabetic neuropathy, unspecified: Secondary | ICD-10-CM

## 2015-07-28 DIAGNOSIS — I700 Atherosclerosis of aorta: Secondary | ICD-10-CM

## 2015-07-28 DIAGNOSIS — I70299 Other atherosclerosis of native arteries of extremities, unspecified extremity: Secondary | ICD-10-CM

## 2015-07-28 DIAGNOSIS — F331 Major depressive disorder, recurrent, moderate: Secondary | ICD-10-CM

## 2015-07-28 DIAGNOSIS — R634 Abnormal weight loss: Secondary | ICD-10-CM

## 2015-07-28 DIAGNOSIS — R21 Rash and other nonspecific skin eruption: Secondary | ICD-10-CM

## 2015-07-28 DIAGNOSIS — N529 Male erectile dysfunction, unspecified: Secondary | ICD-10-CM

## 2015-07-28 DIAGNOSIS — Z125 Encounter for screening for malignant neoplasm of prostate: Secondary | ICD-10-CM

## 2015-07-28 DIAGNOSIS — Z1211 Encounter for screening for malignant neoplasm of colon: Secondary | ICD-10-CM

## 2015-07-28 DIAGNOSIS — E785 Hyperlipidemia, unspecified: Secondary | ICD-10-CM

## 2015-07-28 DIAGNOSIS — F17200 Nicotine dependence, unspecified, uncomplicated: Secondary | ICD-10-CM

## 2015-07-28 DIAGNOSIS — Z720 Tobacco use: Secondary | ICD-10-CM

## 2015-07-28 DIAGNOSIS — Z0000 Encounter for general adult medical examination without abnormal findings: Secondary | ICD-10-CM | POA: Insufficient documentation

## 2015-07-28 DIAGNOSIS — B1920 Unspecified viral hepatitis C without hepatic coma: Secondary | ICD-10-CM

## 2015-07-28 LAB — COMPREHENSIVE METABOLIC PANEL
ALBUMIN: 4.3 g/dL (ref 3.5–5.2)
ALK PHOS: 87 U/L (ref 39–117)
ALT: 28 U/L (ref 0–53)
AST: 28 U/L (ref 0–37)
BILIRUBIN TOTAL: 2 mg/dL — AB (ref 0.2–1.2)
BUN: 10 mg/dL (ref 6–23)
CALCIUM: 9.7 mg/dL (ref 8.4–10.5)
CO2: 23 mEq/L (ref 19–32)
Chloride: 103 mEq/L (ref 96–112)
Creatinine, Ser: 0.84 mg/dL (ref 0.40–1.50)
GFR: 101.61 mL/min (ref >60.00)
GLUCOSE: 129 mg/dL — AB (ref 70–99)
POTASSIUM: 4.4 meq/L (ref 3.5–5.1)
Sodium: 135 mEq/L (ref 135–145)
TOTAL PROTEIN: 7.9 g/dL (ref 6.0–8.3)

## 2015-07-28 LAB — CBC WITH DIFFERENTIAL/PLATELET
BASOS ABS: 0 10*3/uL (ref 0.0–0.1)
BASOS PCT: 0.3 % (ref 0.0–3.0)
EOS ABS: 0.3 10*3/uL (ref 0.0–0.7)
Eosinophils Relative: 4.1 % (ref 0.0–5.0)
HCT: 49.4 % (ref 39.0–52.0)
HEMOGLOBIN: 16.9 g/dL (ref 13.0–17.0)
Lymphocytes Relative: 12.4 % (ref 12.0–46.0)
Lymphs Abs: 1 10*3/uL (ref 0.7–4.0)
MCHC: 34.2 g/dL (ref 30.0–36.0)
MCV: 95.4 fl (ref 78.0–100.0)
MONO ABS: 0.6 10*3/uL (ref 0.1–1.0)
Monocytes Relative: 7.1 % (ref 3.0–12.0)
Neutro Abs: 6.4 10*3/uL (ref 1.4–7.7)
Neutrophils Relative %: 76.1 % (ref 43.0–77.0)
Platelets: 126 10*3/uL — ABNORMAL LOW (ref 150.0–400.0)
RBC: 5.18 Mil/uL (ref 4.22–5.81)
RDW: 15.5 % (ref 11.5–15.5)
WBC: 8.4 10*3/uL (ref 4.0–10.5)

## 2015-07-28 LAB — PSA: PSA: 0.31 ng/mL (ref 0.10–4.00)

## 2015-07-28 LAB — LDL CHOLESTEROL, DIRECT: Direct LDL: 104 mg/dL

## 2015-07-28 LAB — LACTATE DEHYDROGENASE: LDH: 183 U/L (ref 94–250)

## 2015-07-28 LAB — HEMOGLOBIN A1C: HEMOGLOBIN A1C: 5.6 % (ref 4.6–6.5)

## 2015-07-28 LAB — SEDIMENTATION RATE: SED RATE: 12 mm/h (ref 0–22)

## 2015-07-28 LAB — TSH: TSH: 0.7 u[IU]/mL (ref 0.35–4.50)

## 2015-07-28 MED ORDER — PERMETHRIN 5 % EX CREA: 1.0000 "application " | TOPICAL_CREAM | Freq: Once | CUTANEOUS | Status: DC

## 2015-07-28 MED ORDER — AMITRIPTYLINE HCL 100 MG PO TABS: 100.0000 mg | ORAL_TABLET | Freq: Every day | ORAL | Status: DC

## 2015-07-28 MED ORDER — SILDENAFIL CITRATE 100 MG PO TABS: 50.0000 mg | ORAL_TABLET | Freq: Every day | ORAL | Status: DC | PRN

## 2015-07-28 NOTE — Assessment & Plan Note (Signed)
Preventative protocols reviewed and updated unless pt declined. Discussed healthy diet and lifestyle.  

## 2015-07-28 NOTE — Assessment & Plan Note (Signed)
Reprinted trial viagra. Coupon provided last visit.

## 2015-07-28 NOTE — Progress Notes (Signed)
Pre visit review using our clinic review tool, if applicable. No additional management support is needed unless otherwise documented below in the visit note. 

## 2015-07-28 NOTE — Assessment & Plan Note (Signed)
Check d LDL 

## 2015-07-28 NOTE — Assessment & Plan Note (Signed)
Generalized diffuse pruritic rash, exposure to scabies. Anticipate scabies, less likely hep C related rash. Treat with permethrin. Discussed washing all clothing/bedding in hot water. Update if not improved with treatment.

## 2015-07-28 NOTE — Telephone Encounter (Signed)
Message left notifying patient to check through paperwork that was given to him at his visit.

## 2015-07-28 NOTE — Patient Instructions (Addendum)
I do think you have scabies. Treat with permethrin cream sent to pharmacy. For toe - use antibiotic ointment dressing changes daily. Watch for spreading redness.  We will call you to schedule colonoscopy.  We will also schedule abdominal ultrasound to follow history of hepatitis C.  Stop metformin today.  labwork today.  We will call you with results.  Scabies, Adult Scabies is a skin condition that happens when very small insects get under the skin (infestation). This causes a rash and severe itchiness. Scabies can spread from person to person (is contagious). If you get scabies, it is common for others in your household to get scabies too. With proper treatment, symptoms usually go away in 2-4 weeks. Scabies usually does not cause lasting problems. CAUSES This condition is caused by mites (Sarcoptes scabiei, or human itch mites) that can only be seen with a microscope. The mites get into the top layer of skin and lay eggs. Scabies can spread from person to person through:  Close contact with a person who has scabies.  Contact with infested items, such as towels, bedding, or clothing. RISK FACTORS This condition is more likely to develop in:  People who live in nursing homes and other extended-care facilities.  People who have sexual contact with a partner who has scabies.  Young children who attend child care facilities.  People who care for others who are at increased risk for scabies. SYMPTOMS Symptoms of this condition may include:  Severe itchiness. This is often worse at night.  A rash that includes tiny red bumps or blisters. The rash commonly occurs on the wrist, elbow, armpit, fingers, waist, groin, or buttocks. Bumps may form a line (burrow) in some areas.  Skin irritation. This can include scaly patches or sores. DIAGNOSIS This condition is diagnosed with a physical exam. Your health care provider will look closely at your skin. In some cases, your health care provider  may take a sample of your affected skin (skin scraping) and have it examined under a microscope. TREATMENT This condition may be treated with:  Medicated cream or lotion that kills the mites. This is spread on the entire body and left on for several hours. Usually, one treatment with medicated cream or lotion is enough to kill all of the mites. In severe cases, the treatment may be repeated.  Medicated cream that relieves itching.  Medicines that help to relieve itching.  Medicines that kill the mites. This treatment is rarely used. HOME CARE INSTRUCTIONS Medicines  Take or apply over-the-counter and prescription medicines as told by your health care provider.  Apply medicated cream or lotion as told by your health care provider.  Do not wash off the medicated cream or lotion until the necessary amount of time has passed. Skin Care  Avoid scratching your affected skin.  Keep your fingernails closely trimmed to reduce injury from scratching.  Take cool baths or apply cool washcloths to help reduce itching. General Instructions  Clean all items that you recently had contact with, including bedding, clothing, and furniture. Do this on the same day that your treatment starts.  Use hot water when you wash items.  Place unwashable items into closed, airtight plastic bags for at least 3 days. The mites cannot live for more than 3 days away from human skin.  Vacuum furniture and mattresses that you use.  Make sure that other people who may have been infested are examined by a health care provider. These include members of your household and  anyone who may have had contact with infested items.  Keep all follow-up visits as told by your health care provider. This is important. SEEK MEDICAL CARE IF:  You have itching that does not go away after 4 weeks of treatment.  You continue to develop new bumps or burrows.  You have redness, swelling, or pain in your rash area after  treatment.  You have fluid, blood, or pus coming from your rash.   This information is not intended to replace advice given to you by your health care provider. Make sure you discuss any questions you have with your health care provider.   Document Released: 11/23/2014 Document Reviewed: 10/04/2014 Elsevier Interactive Patient Education [employer]

## 2015-07-28 NOTE — Addendum Note (Signed)
Addended by: Eustaquio BoydenGUTIERREZ, Reisa Coppola on: 07/28/2015 11:46 AM   Modules accepted: Kipp BroodSmartSet
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
## 2015-07-28 NOTE — Assessment & Plan Note (Signed)
Continue to encourage cessation. Consider chest CT if abd US, labs unrevealing to further eval weight loss.

## 2015-07-28 NOTE — Assessment & Plan Note (Signed)
Continue f/u with psych. I am prescribing amitriptyline 100mg  daily.

## 2015-07-28 NOTE — Assessment & Plan Note (Signed)
Last A1c 5.9. Recheck today. With 20 lb weight loss noted, unexpected. Stop metformin. See below.

## 2015-07-28 NOTE — Telephone Encounter (Signed)
Ok to refill? Can't get refilled at [facility]. Needs Rx sent to [facility].

## 2015-07-28 NOTE — Assessment & Plan Note (Addendum)
I have not received records confirming this. Check Hep C ab with reflex. Update abd US. See below.

## 2015-07-28 NOTE — Telephone Encounter (Signed)
Pt left v/m; request permethrin cream sent to [facility] [place]; [facility] closed today.advised pt done.

## 2015-07-28 NOTE — Telephone Encounter (Signed)
I gave to patient at office visit I believe.

## 2015-07-28 NOTE — Assessment & Plan Note (Signed)
chronic. Continue current regimen. Did not take lisinopril this morning. No changes today.

## 2015-07-28 NOTE — Assessment & Plan Note (Signed)
Will need to discuss aspirin, statin next visit.

## 2015-07-28 NOTE — Assessment & Plan Note (Signed)
20+ lb weight loss in last 1.5 months, not trying. Stop metformin anticipate no longer needed Check labwork today, abd US in setting of hep C.  Consider CT chest in long term smoker.

## 2015-07-29 LAB — HEPATITIS C ANTIBODY: HCV Ab: REACTIVE — AB

## 2015-07-31 ENCOUNTER — Other Ambulatory Visit: Payer: Self-pay | Admitting: *Deleted

## 2015-07-31 ENCOUNTER — Telehealth: Payer: Self-pay

## 2015-07-31 NOTE — Telephone Encounter (Signed)
Pt left v/m [facility] left v/m per DPR pt should have [facility] contact our office if needed to help with refills.

## 2015-07-31 NOTE — Telephone Encounter (Signed)
Ok to refill 

## 2015-08-01 ENCOUNTER — Ambulatory Visit (INDEPENDENT_AMBULATORY_CARE_PROVIDER_SITE_OTHER): Payer: No Typology Code available for payment source | Admitting: Clinical

## 2015-08-01 ENCOUNTER — Encounter (HOSPITAL_COMMUNITY): Payer: Self-pay | Admitting: Clinical

## 2015-08-01 ENCOUNTER — Ambulatory Visit (HOSPITAL_COMMUNITY): Admission: RE | Admit: 2015-08-01 | Payer: Self-pay | Source: Ambulatory Visit

## 2015-08-01 DIAGNOSIS — F1099 Alcohol use, unspecified with unspecified alcohol-induced disorder: Secondary | ICD-10-CM

## 2015-08-01 DIAGNOSIS — F4310 Post-traumatic stress disorder, unspecified: Secondary | ICD-10-CM

## 2015-08-01 DIAGNOSIS — IMO0002 Reserved for concepts with insufficient information to code with codable children: Secondary | ICD-10-CM

## 2015-08-01 DIAGNOSIS — F1090 Alcohol use, unspecified, uncomplicated: Secondary | ICD-10-CM

## 2015-08-01 DIAGNOSIS — F3131 Bipolar disorder, current episode depressed, mild: Secondary | ICD-10-CM

## 2015-08-01 LAB — HEPATITIS C RNA QUANTITATIVE: HCV Quantitative Log: <1.18 {Log} (ref <1.18)

## 2015-08-01 MED ORDER — IBUPROFEN 800 MG PO TABS: 800.0000 mg | ORAL_TABLET | Freq: Three times a day (TID) | ORAL | Status: DC | PRN

## 2015-08-01 NOTE — Progress Notes (Signed)
   THERAPIST PROGRESS NOTE  Session Time: 2:30 - 3:05  Participation Level: Active  Behavioral Response: CasualAlertDepressed  Type of Therapy: Individual Therapy  Treatment Goals addressed: improve psychiatric symptoms,  Reduce irrational worries and fears (accurately interpret ordinary events and situations), Healthy coping skills (return to work, adjust to life outside prison),  Learn about diagnosis, Relapse prevention skills  Interventions: CBT and Motivational Interviewing, Grounding and Mindfulness Techniques, psychoeducation  Summary: Erique Kaser is a 55  y.o. male who presents with Bipolar affective disorder, currently depressed, mild, PTSD (post-traumatic stress disorder), Alcohol use disorder, Opium use disorder, severe, in sustained remission   Suicidal/Homicidal: No -without intent/plan  Therapist ResponseTommie Raymond met with clinician for an individual session. Tommie Raymond discussed his psychiatric symptoms, his current life events, and his homework. Jarmal shared he has some okay days but has been feeling depressed and worried. Shanti shared that he lost his homework. Clinician asked if she printed it again would he be likely to do it. He shared he was unlikely to do it. He also shared that he is struggling with negative thoughts. He shared he has concern about  Where he will be living as his mother is selling the house he is currently living in. Client and clinician discussed job and housing resources and he efforts. He shared that he is concerned with staying out of prison. Clinician asked open ended questions and Sahid identified the steps he would need to take to stay out of prison. He was able to identify someone who he might be able to assist him- a friend who is older and has been out for 25 years. Dehaven shared that he has lost a lot of weight 307 lbs down to 246 since being out. Client and clinician discussed his thoughts about why he has lost so much weight. He shared that he  believes it has to do with worrying and anxiety.  Client and clinician discussed ways to reduce worrying ( taking care of what he can). He also shared that he is experiencing chronic pain. He shared he has smoked pot, but also He reports that he has stopped drinking. Client and clinician discussed how his pot use might get in the way of getting medical assistance for his pain. Client and clinician discussed his relapse prevention skills for alcohol and how to apply them to his mariajuana use. On a positive not he reports that he signed up for class at [facility]. Which could assist him in having a focus and goals to work towards.  Plan: [place] again in 1 weeks.  Diagnosis: Axis I: Bipolar affective disorder, currently depressed, mild, PTSD (post-traumatic stress disorder), Alcohol use disorder, Opium use disorder, severe, in sustained remission     Fartun Paradiso A, LCSW 08/01/2015

## 2015-08-02 ENCOUNTER — Ambulatory Visit (HOSPITAL_COMMUNITY)
Admission: RE | Admit: 2015-08-02 | Discharge: 2015-08-02 | Disposition: A | Discharge status: Home / Self Care | Payer: Self-pay | Source: Ambulatory Visit | Attending: Family Medicine | Admitting: Family Medicine

## 2015-08-02 ENCOUNTER — Ambulatory Visit (HOSPITAL_COMMUNITY): Payer: Self-pay | Admitting: Psychiatry

## 2015-08-02 DIAGNOSIS — B1920 Unspecified viral hepatitis C without hepatic coma: Secondary | ICD-10-CM | POA: Insufficient documentation

## 2015-08-02 DIAGNOSIS — R161 Splenomegaly, not elsewhere classified: Secondary | ICD-10-CM | POA: Insufficient documentation

## 2015-08-02 DIAGNOSIS — K829 Disease of gallbladder, unspecified: Secondary | ICD-10-CM | POA: Insufficient documentation

## 2015-08-02 DIAGNOSIS — R634 Abnormal weight loss: Secondary | ICD-10-CM | POA: Insufficient documentation

## 2015-08-08 ENCOUNTER — Ambulatory Visit (INDEPENDENT_AMBULATORY_CARE_PROVIDER_SITE_OTHER): Payer: No Typology Code available for payment source | Admitting: Psychiatry

## 2015-08-08 ENCOUNTER — Encounter (HOSPITAL_COMMUNITY): Payer: Self-pay | Admitting: Psychiatry

## 2015-08-08 VITALS — BP 138/80 | HR 71 | Ht 74.0 in | Wt 246.4 lb

## 2015-08-08 DIAGNOSIS — F3131 Bipolar disorder, current episode depressed, mild: Secondary | ICD-10-CM

## 2015-08-08 DIAGNOSIS — F4310 Post-traumatic stress disorder, unspecified: Secondary | ICD-10-CM

## 2015-08-08 DIAGNOSIS — F1010 Alcohol abuse, uncomplicated: Secondary | ICD-10-CM

## 2015-08-08 DIAGNOSIS — F1210 Cannabis abuse, uncomplicated: Secondary | ICD-10-CM

## 2015-08-08 MED ORDER — ARIPIPRAZOLE 10 MG PO TABS
10.0000 mg | ORAL_TABLET | Freq: Every day | ORAL | Status: DC
Start: 2015-08-08 — End: 2015-10-20
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
## 2015-08-09 ENCOUNTER — Other Ambulatory Visit: Payer: Self-pay | Admitting: Family Medicine

## 2015-08-09 DIAGNOSIS — B1920 Unspecified viral hepatitis C without hepatic coma: Secondary | ICD-10-CM

## 2015-08-09 DIAGNOSIS — H269 Unspecified cataract: Secondary | ICD-10-CM

## 2015-08-09 MED ORDER — PERMETHRIN 5 % EX CREA: 1.0000 "application " | TOPICAL_CREAM | Freq: Once | CUTANEOUS | Status: DC

## 2015-08-20 ENCOUNTER — Encounter (HOSPITAL_COMMUNITY): Payer: Self-pay | Admitting: Emergency Medicine

## 2015-08-20 ENCOUNTER — Emergency Department (HOSPITAL_COMMUNITY): Payer: Self-pay

## 2015-08-20 ENCOUNTER — Inpatient Hospital Stay (HOSPITAL_COMMUNITY)
Admission: EM | Admit: 2015-08-20 | Discharge: 2015-08-20 | DRG: 872 | Discharge status: Against Medical Advice | Payer: Self-pay | Attending: Internal Medicine | Admitting: Internal Medicine

## 2015-08-20 DIAGNOSIS — Z818 Family history of other mental and behavioral disorders: Secondary | ICD-10-CM

## 2015-08-20 DIAGNOSIS — F17210 Nicotine dependence, cigarettes, uncomplicated: Secondary | ICD-10-CM | POA: Diagnosis present

## 2015-08-20 DIAGNOSIS — E871 Hypo-osmolality and hyponatremia: Secondary | ICD-10-CM | POA: Diagnosis present

## 2015-08-20 DIAGNOSIS — E860 Dehydration: Secondary | ICD-10-CM | POA: Diagnosis present

## 2015-08-20 DIAGNOSIS — F064 Anxiety disorder due to known physiological condition: Secondary | ICD-10-CM | POA: Diagnosis present

## 2015-08-20 DIAGNOSIS — R0902 Hypoxemia: Secondary | ICD-10-CM | POA: Diagnosis present

## 2015-08-20 DIAGNOSIS — Z833 Family history of diabetes mellitus: Secondary | ICD-10-CM

## 2015-08-20 DIAGNOSIS — H5462 Unqualified visual loss, left eye, normal vision right eye: Secondary | ICD-10-CM | POA: Diagnosis present

## 2015-08-20 DIAGNOSIS — Z823 Family history of stroke: Secondary | ICD-10-CM

## 2015-08-20 DIAGNOSIS — K219 Gastro-esophageal reflux disease without esophagitis: Secondary | ICD-10-CM | POA: Diagnosis present

## 2015-08-20 DIAGNOSIS — E785 Hyperlipidemia, unspecified: Secondary | ICD-10-CM | POA: Diagnosis present

## 2015-08-20 DIAGNOSIS — N179 Acute kidney failure, unspecified: Secondary | ICD-10-CM | POA: Diagnosis present

## 2015-08-20 DIAGNOSIS — H3530 Unspecified macular degeneration: Secondary | ICD-10-CM | POA: Diagnosis present

## 2015-08-20 DIAGNOSIS — A419 Sepsis, unspecified organism: Principal | ICD-10-CM | POA: Diagnosis present

## 2015-08-20 DIAGNOSIS — E1140 Type 2 diabetes mellitus with diabetic neuropathy, unspecified: Secondary | ICD-10-CM | POA: Diagnosis present

## 2015-08-20 DIAGNOSIS — Z8249 Family history of ischemic heart disease and other diseases of the circulatory system: Secondary | ICD-10-CM

## 2015-08-20 DIAGNOSIS — R6520 Severe sepsis without septic shock: Secondary | ICD-10-CM | POA: Diagnosis present

## 2015-08-20 LAB — CBC WITH DIFFERENTIAL/PLATELET
BASOS ABS: 0 10*3/uL (ref 0.0–0.1)
BASOS PCT: 0 %
EOS ABS: 0 10*3/uL (ref 0.0–0.7)
Eosinophils Relative: 0 %
HEMATOCRIT: 44.8 % (ref 39.0–52.0)
HEMOGLOBIN: 15.3 g/dL (ref 13.0–17.0)
Lymphocytes Relative: 5 %
Lymphs Abs: 0.6 10*3/uL — ABNORMAL LOW (ref 0.7–4.0)
MCH: 33.3 pg (ref 26.0–34.0)
MCHC: 34.2 g/dL (ref 30.0–36.0)
MCV: 97.4 fL (ref 78.0–100.0)
MONOS PCT: 10 %
Monocytes Absolute: 1.2 10*3/uL — ABNORMAL HIGH (ref 0.1–1.0)
NEUTROS ABS: 9.7 10*3/uL — AB (ref 1.7–7.7)
NEUTROS PCT: 85 %
Platelets: ADEQUATE 10*3/uL (ref 150–400)
RBC: 4.6 MIL/uL (ref 4.22–5.81)
RDW: 14.5 % (ref 11.5–15.5)
WBC: 11.5 10*3/uL — AB (ref 4.0–10.5)

## 2015-08-20 LAB — COMPREHENSIVE METABOLIC PANEL
ALK PHOS: 59 U/L (ref 38–126)
ALT: 53 U/L (ref 17–63)
ANION GAP: 7 (ref 5–15)
AST: 130 U/L — ABNORMAL HIGH (ref 15–41)
Albumin: 3.8 g/dL (ref 3.5–5.0)
BILIRUBIN TOTAL: 1.4 mg/dL — AB (ref 0.3–1.2)
BUN: 28 mg/dL — ABNORMAL HIGH (ref 6–20)
CALCIUM: 8.8 mg/dL — AB (ref 8.9–10.3)
CO2: 25 mmol/L (ref 22–32)
CREATININE: 1.74 mg/dL — AB (ref 0.61–1.24)
Chloride: 94 mmol/L — ABNORMAL LOW (ref 101–111)
GFR, EST AFRICAN AMERICAN: 50 mL/min — AB (ref >60)
GFR, EST NON AFRICAN AMERICAN: 43 mL/min — AB (ref >60)
Glucose, Bld: 125 mg/dL — ABNORMAL HIGH (ref 65–99)
Potassium: 4.4 mmol/L (ref 3.5–5.1)
SODIUM: 126 mmol/L — AB (ref 135–145)
TOTAL PROTEIN: 8 g/dL (ref 6.5–8.1)

## 2015-08-20 LAB — URINE MICROSCOPIC-ADD ON

## 2015-08-20 LAB — URINALYSIS, ROUTINE W REFLEX MICROSCOPIC
Bilirubin Urine: NEGATIVE
Glucose, UA: NEGATIVE mg/dL
Ketones, ur: 15 mg/dL — AB
Leukocytes, UA: NEGATIVE
NITRITE: NEGATIVE
PH: 6 (ref 5.0–8.0)
Protein, ur: 100 mg/dL — AB
SPECIFIC GRAVITY, URINE: 1.025 (ref 1.005–1.030)

## 2015-08-20 LAB — I-STAT CG4 LACTIC ACID, ED
LACTIC ACID, VENOUS: 2.74 mmol/L — AB (ref 0.5–2.0)
Lactic Acid, Venous: 1.69 mmol/L (ref 0.5–2.0)

## 2015-08-20 LAB — LACTIC ACID, PLASMA: Lactic Acid, Venous: 0.9 mmol/L (ref 0.5–2.0)

## 2015-08-20 MED ORDER — SODIUM CHLORIDE 0.9 % IV BOLUS (SEPSIS)
500.0000 mL | Freq: Once | INTRAVENOUS | Status: AC
  Administered 2015-08-20: 15:00:00 via INTRAVENOUS

## 2015-08-20 MED ORDER — LEVOFLOXACIN IN D5W 750 MG/150ML IV SOLN
750.0000 mg | Freq: Once | INTRAVENOUS | Status: AC
  Administered 2015-08-20: 750 mg via INTRAVENOUS
  Filled 2015-08-20: qty 150

## 2015-08-20 MED ORDER — VANCOMYCIN HCL IN DEXTROSE 1-5 GM/200ML-% IV SOLN
1000.0000 mg | Freq: Once | INTRAVENOUS | Status: AC
  Administered 2015-08-20: 1000 mg via INTRAVENOUS
  Filled 2015-08-20: qty 200

## 2015-08-20 MED ORDER — SODIUM CHLORIDE 0.9 % IV BOLUS (SEPSIS)
1000.0000 mL | Freq: Once | INTRAVENOUS | Status: AC
  Administered 2015-08-20: 1000 mL via INTRAVENOUS

## 2015-08-20 MED ORDER — SODIUM CHLORIDE 0.9 % IV BOLUS (SEPSIS)
1000.0000 mL | Freq: Once | INTRAVENOUS | Status: AC
  Administered 2015-08-20: 1000 mL via INTRAVENOUS
  Filled 2015-08-20: qty 1000

## 2015-08-20 MED ORDER — IOPAMIDOL (ISOVUE-370) INJECTION 76%
80.0000 mL | Freq: Once | INTRAVENOUS | Status: AC | PRN
Start: 2015-08-20 — End: 2015-08-20
  Administered 2015-08-20: 80 mL via INTRAVENOUS

## 2015-08-20 NOTE — ED Notes (Signed)
Pt states he was coming out of the attic 2 days ago and passed out and fell face first.  States his friend did Warden/rangercpr and he thinks his sternum may be broken.

## 2015-08-20 NOTE — ED Provider Notes (Addendum)
CSN: [id number]     Arrival date & time 08/20/15  1140 History  By signing my name below, I, Randy Barnes, attest that this documentation has been prepared under the direction and in the presence of Randy Hong, MD. Electronically Signed: Tanda Barnes, ED Scribe. 08/20/2015. 12:06 PM.   Chief Complaint  Patient presents with  . Loss of Consciousness   The history is provided by the patient. No language interpreter was used.    HPI Comments: Randy Barnes is a 55 y.o. male who presents to the Emergency Department complaining of gradual onset, constant, diffuse, chest wall pain x 2 days. Pt reports that he was working in an attic 2 days ago for an extended period of time. While in the attic he began having chills and decided to go down the ladder leading to the attic. On the way down pt lost consciousness and fell face first onto carpet. A coworker had to administer CPR for only a quick second to get pt to breathe. Pt has been having a cough proceeding the incident 2 days ago. Denies diarrhea, vomiting,or any other associated symptoms.   Past Medical History  Diagnosis Date  . Type 2 diabetes, uncontrolled, with neuropathy (HCC)   . Hepatitis C   . Hypertension   . Anxiety disorder due to general medical condition with panic attack     h/o hospitalizations for behavioral issues (1980s, 2000s)  . DDD (degenerative disc disease), lumbar     mild with dextroscoliosis  . MDD (major depressive disorder), recurrent episode, moderate (HCC)   . Emphysema of lung (HCC)     ?asthmatic bronchitis per prior PCP  . History of stomach ulcers   . Hyperlipidemia   . 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 Urine incontinence   . GERD (gastroesophageal reflux disease)   . Psoriasis 01/08/2015  . Macular degeneration   . Problems related to release from prison 07/2014  . Aorto-iliac atherosclerosis [facility])    Past Surgical History  Procedure Laterality  Date  . Strabismus surgery Left     left eye vision loss  . Tonsillectomy     Family History  Problem Relation Age of Onset  . Diabetes Father   . Heart failure Father   . CAD Father   . CAD Paternal Grandfather   . Stroke Mother   . Hypertension Mother   . Mental illness Mother   . Depression Mother   . Stroke Maternal Grandfather   . Bipolar disorder Brother     ?   Social History  Substance Use Topics  . Smoking status: Current Every Day Smoker -- 1.00 packs/day for 29 years    Types: Cigarettes    Start date: 03/18/1973  . Smokeless tobacco: Former Neurosurgeon    Types: Snuff     Comment: Has tried dip in the past some  . Alcohol Use: 3.6 oz/week    6 Cans of beer per week     Comment: occasional, not regular    Review of Systems  Respiratory: Positive for cough.   Cardiovascular: Positive for chest pain.  Gastrointestinal: Negative for vomiting and diarrhea.  Neurological: Positive for syncope.  All other systems reviewed and are negative.     Allergies  Penicillins and Guaifenesin & derivatives  Home Medications   Prior to Admission medications   Medication Sig Start Date End Date Taking? Authorizing Provider  albuterol (VENTOLIN HFA) 108 (  90 Base) MCG/ACT inhaler Inhale 2 puffs into the lungs every 6 (six) hours as needed for wheezing or shortness of breath. 05/23/15  Yes Eustaquio Boyden, MD  amitriptyline (ELAVIL) 100 MG tablet Take 1 tablet (100 mg total) by mouth at bedtime. 07/28/15  Yes Eustaquio Boyden, MD  ARIPiprazole (ABILIFY) 10 MG tablet Take 1 tablet (10 mg total) by mouth daily. 08/08/15 08/07/16 Yes Cleotis Nipper, MD  gabapentin (NEURONTIN) 800 MG tablet Take 3,200 mg by mouth 4 (four) times daily as needed.   Yes Historical Provider, MD  ibuprofen (ADVIL,MOTRIN) 800 MG tablet Take 1 tablet (800 mg total) by mouth every 8 (eight) hours as needed. 08/01/15  Yes Eustaquio Boyden, MD  lisinopril (PRINIVIL,ZESTRIL) 40 MG tablet Take 1 tablet (40 mg total) by  mouth daily. 06/19/15  Yes Eustaquio Boyden, MD  omeprazole (PRILOSEC) 20 MG capsule Take 1 capsule (20 mg total) by mouth 2 (two) times daily before a meal. 07/24/15  Yes Eustaquio Boyden, MD  sildenafil (VIAGRA) 100 MG tablet Take 0.5-1 tablets (50-100 mg total) by mouth daily as needed for erectile dysfunction. 07/28/15  Yes Eustaquio Boyden, MD  tamsulosin (FLOMAX) 0.4 MG CAPS capsule Take twice a day 02/21/15  Yes Eustaquio Boyden, MD  Glucose Blood (BLOOD GLUCOSE TEST STRIPS) STRP Use as directed Patient not taking: Reported on 08/20/2015 04/28/15   Eustaquio Boyden, MD  permethrin (ACTICIN) 5 % cream Apply 1 application topically once. Use as directed Patient not taking: Reported on 08/20/2015 08/09/15   Eustaquio Boyden, MD   BP 112/69 mmHg  Pulse 91  Temp(Src) 100.8 F (38.2 C) (Oral)  Resp 26  Ht 6\' 2"  (1.88 m)  Wt 246 lb (111.585 kg)  BMI 31.57 kg/m2  SpO2 94%   Physical Exam  Constitutional: He appears well-developed and well-nourished. No distress.  HENT:  Head: Normocephalic and atraumatic.  Mouth/Throat: Oropharynx is clear and moist. No oropharyngeal exudate.  Eyes: Conjunctivae and EOM are normal. Pupils are equal, round, and reactive to light. Right eye exhibits no discharge. Left eye exhibits no discharge. No scleral icterus.  Neck: Normal range of motion. Neck supple. No JVD present. No thyromegaly present.  Cardiovascular: Regular rhythm, normal heart sounds and intact distal pulses.  Tachycardia present.  Exam reveals no gallop and no friction rub.   No murmur heard. Tachy to 110 with strong pulses  Pulmonary/Chest: No accessory muscle usage. Tachypnea noted. No respiratory distress. He has no wheezes. He has rhonchi. He has no rales. He exhibits tenderness.  Tenderness over the sternum Diffuse rhonchi Mild tachypnea Hypoxic to 85% No accessory muscle usage  Abdominal: Soft. Bowel sounds are normal. He exhibits no distension and no mass. There is no tenderness.   Musculoskeletal: Normal range of motion. He exhibits no edema or tenderness.  Lymphadenopathy:    He has no cervical adenopathy.  Neurological: He is alert. Coordination normal.  Straight leg raise 5/5 bilaterally  Skin: Skin is warm and dry. No rash noted. No erythema.  Psychiatric: He has a normal mood and affect. His behavior is normal.  Nursing note and vitals reviewed.   ED Course  Procedures (including critical care time)  DIAGNOSTIC STUDIES: Oxygen Saturation is 88% on RA, low by my interpretation.    COORDINATION OF CARE: 12:04 PM-Discussed treatment plan with pt at bedside and pt agreed to plan.   Labs Review Labs Reviewed  COMPREHENSIVE METABOLIC PANEL - Abnormal; Notable for the following:    Sodium 126 (*)    Chloride 94 (*)  Glucose, Bld 125 (*)    BUN 28 (*)    Creatinine, Ser 1.74 (*)    Calcium 8.8 (*)    AST 130 (*)    Total Bilirubin 1.4 (*)    GFR calc non Af Amer 43 (*)    GFR calc Af Amer 50 (*)    All other components within normal limits  CBC WITH DIFFERENTIAL/PLATELET - Abnormal; Notable for the following:    WBC 11.5 (*)    Neutro Abs 9.7 (*)    Lymphs Abs 0.6 (*)    Monocytes Absolute 1.2 (*)    All other components within normal limits  URINALYSIS, ROUTINE W REFLEX MICROSCOPIC (NOT AT [facility]) - Abnormal; Notable for the following:    Color, Urine AMBER (*)    Hgb urine dipstick SMALL (*)    Ketones, ur 15 (*)    Protein, ur 100 (*)    All other components within normal limits  URINE MICROSCOPIC-ADD ON - Abnormal; Notable for the following:    Squamous Epithelial / LPF 0-5 (*)    Bacteria, UA RARE (*)    All other components within normal limits  I-STAT CG4 LACTIC ACID, ED - Abnormal; Notable for the following:    Lactic Acid, Venous 2.74 (*)    All other components within normal limits  CULTURE, BLOOD (ROUTINE X 2)  CULTURE, BLOOD (ROUTINE X 2)  URINE CULTURE  LACTIC ACID, PLASMA  I-STAT CG4 LACTIC ACID, ED    Imaging Review Dg  Chest 2 View  08/20/2015  CLINICAL DATA:  Cough and fever. EXAM: CHEST  2 VIEW COMPARISON:  01/09/2015 FINDINGS: The heart size and mediastinal contours are within normal limits. Both lungs are clear. The visualized skeletal structures are unremarkable. IMPRESSION: No active cardiopulmonary disease. Electronically Signed   By: Signa Kell M.D.   On: 08/20/2015 12:50   I have personally reviewed and evaluated these images and lab results as part of my medical decision-making.   EKG Interpretation   Date/Time:  Sunday August 20 2015 11:58:19 EDT Ventricular Rate:  107 PR Interval:  140 QRS Duration: 102 QT Interval:  341 QTC Calculation: 455 R Axis:   25 Text Interpretation:  Sinus tachycardia Low voltage, extremity leads  Anteroseptal infarct, old No old tracing to compare Confirmed by Andy Allende   MD, Salina Stanfield ([id number]) on 08/20/2015 12:19:56 PM      MDM   Final diagnoses:  Severe sepsis (HCC)  Hyponatremia  AKI (acute kidney injury) (HCC)   I personally performed the services described in this documentation, which was scribed in my presence. The recorded information has been reviewed and is accurate.    The patient has had multiple labs including a lactic acid of 2.74, this is a repeat and has gone up since his first one. Blood cultures are pending, he does have some acute kidney injury with some hyponatremia as well as a leukocytosis of 11,500. His urinalysis shows no signs of infection, he does have ketones. His chest x-ray is also clear.  Repeat vital signs show that the patient does have an oxygen level of 93%, he is hypotensive to 92/56, he is no longer tachycardic. He will need a CT angiogram however I am concerned about his kidney function and dehydrated status, I will discuss with the hospitalist regarding further treatment and evaluation. I believe that he does need to be inpatient, I will cover him for pulmonary sepsis, he has had intravenous Levaquin and vancomycin to cover for  pulmonary pathogens  given his coughing shortness of breath and fever, leukocytosis and hypoxia. The patient does appear critically ill. He has had 30 mL/kg of IV fluids  After IV fluid administration the patient had improvement, his last blood pressure was higher at 105/73. His clinical status has slightly improved despite his lactic acid rising. I have discussed his care with the hospitalist, Dr. Kerry HoughMemon, I'm very grateful for his consultation, he will admit the patient to stepdown  The pt will undergo CT angio of the chest to r/o PE - has had 30cc/kg of fluids and is improved - AKI likely will be on the mend.  Medications  sodium chloride 0.9 % bolus 1,000 mL (0 mLs Intravenous Stopped 08/20/15 1304)    And  sodium chloride 0.9 % bolus 1,000 mL (1,000 mLs Intravenous New Bag/Given 08/20/15 1229)    And  sodium chloride 0.9 % bolus 1,000 mL (0 mLs Intravenous Stopped 08/20/15 1444)    And  sodium chloride 0.9 % bolus 500 mL (0 mLs Intravenous Stopped 08/20/15 1559)  levofloxacin (LEVAQUIN) IVPB 750 mg (0 mg Intravenous Stopped 08/20/15 1444)  vancomycin (VANCOCIN) IVPB 1000 mg/200 mL premix (0 mg Intravenous Stopped 08/20/15 1559)  iopamidol (ISOVUE-370) 76 % injection 80 mL (80 mLs Intravenous Contrast Given 08/20/15 1601)    CRITICAL CARE Performed by: Vida RollerBrian D Rayshawn Maney Total critical care time: 35 minutes Critical care time was exclusive of separately billable procedures and treating other patients. Critical care was necessary to treat or prevent imminent or life-threatening deterioration. Critical care was time spent personally by me on the following activities: development of treatment plan with patient and/or surrogate as well as nursing, discussions with consultants, evaluation of patient's response to treatment, examination of patient, obtaining history from patient or surrogate, ordering and performing treatments and interventions, ordering and review of laboratory studies, ordering and review of  radiographic studies, pulse oximetry and re-evaluation of patient's condition.  After hospitalist was consult and, the patient refused to be admitted to the hospital. He was warned both by myself and the hospitalist that this was a life-threatening decision and that he needed to take it very seriously and follow the recommendations of both of his healthcare providers that were taking care of him. He agreed to stay for the CT angiogram to rule out pulmonary embolism. At change of shift care was transferred to Dr. Lorenso CourierMesser who will follow up results, have discussed with patient and assist with disposition. At this time the patient is still staunchly wanting to leave AGAINST MEDICAL ADVICE.  Randy HongBrian Linzee Depaul, MD 08/20/15 1443  Randy HongBrian Daviel Allegretto, MD 08/20/15 [phone]

## 2015-08-20 NOTE — ED Notes (Signed)
Pt asking for pain medication, Dr. Kerry HoughMemon made aware of requesting pain medication.  Dr. Kerry HoughMemon stated no due to pt wanting to leave AMA earlier.

## 2015-08-20 NOTE — ED Notes (Signed)
Asked for security to stay around dept. Due to pt getting loud.

## 2015-08-20 NOTE — Progress Notes (Signed)
I was called by Dr. Hyacinth MeekerMiller to admit Mr. Randy Barnes from ER room 10 for possible pulmonary sepsis and AKI. By the time I evaluated the patient, he had received three liters of IV fluids and was feeling better. Considering his recent collapse/syncope and hypotension on arrival, CTA chest had been ordered to rule out PE   On my arrival, as soon as I introduced myself, patient adamantly said that he does not want to be admitted to the hospital and wishes to go home. I explained the seriousness of his condition and that by not being admitted to he hospital, he could be risking further injury and potentially death. Patient said he planned on following up with his pcp in AM, and under no circumstances was willing to be admitted. I explained the need to rule out PE, and he was agreeable to have CT chest done. If there was evidence of PE, he may consider staying, otherwise he would leave the hospital. I explained this would mean leaving against medical advice and he said he understood. I discussed the case with Dr. Hyacinth MeekerMiller who said he would follow up CT chest results. I advised that if patient changes his mind and is willing to stay, please feel free to recall hospitalists.  Randy Barnes

## 2015-08-20 NOTE — ED Notes (Signed)
Dr. Kerry HoughMemon paged and spoke with him concerning pt's chest CT results, also stated that he was willing to speak with pt if pt was willing to stay.  Pt refusing to stay, pt and pt's visitor is very upset that he did not receive pain medication.  Pt keeps stating that Dr. Hyacinth MeekerMiller had told him that he would order pain med.  No order received for pain medication from Dr. Hyacinth MeekerMiller.  Explained to pt that Dr. Hyacinth MeekerMiller is gone for the day and that I asked Dr. Kerry HoughMemon for pain med for him and answer was no due to pt wanting to leave.  IV's d/c'd and pt removed from telemetry, pt signed AMA form as well.

## 2015-08-20 NOTE — ED Notes (Signed)
Pt refusing to stay in hospital despite I, Dr. Hyacinth MeekerMiller and Dr. Kerry HoughMemon have spoken to him and explained the risks of leaving including death.  Pt had refused CT for me earlier but stated he would for Dr. Kerry HoughMemon.   ICU nurse called for report and I explained to her that pt is refusing to stay.  Will notify any changes

## 2015-08-21 ENCOUNTER — Telehealth: Payer: Self-pay | Admitting: Family Medicine

## 2015-08-21 NOTE — Telephone Encounter (Signed)
Pt has appt with Dr Reece AgarG on 08/22/15 at 9 AM.

## 2015-08-21 NOTE — Telephone Encounter (Signed)
Patient Name: Randy Barnes  DOB: 09/02/1960    Initial Comment Caller states he was in hospital after collapsing from heatstroke, has broken ribs and needs to follow up with primary for pain medication   Nurse Assessment  Nurse: Scarlette ArStandifer, RN, Heather Date/Time (Eastern Time): 08/21/2015 11:06:08 AM  Confirm and document reason for call. If symptomatic, describe symptoms. You must click the next button to save text entered. ---Caller states he was in hospital yesterday after collapsing from heatstroke on Friday, has broken ribs and needs to follow up with primary for pain medication  Has the patient traveled out of the country within the last 30 days? ---Not Applicable  Does the patient have any new or worsening symptoms? ---Yes  Will a triage be completed? ---Yes  Related visit to physician within the last 2 weeks? ---Yes  Does the PT have any chronic conditions? (i.e. diabetes, asthma, etc.) ---Yes  List chronic conditions. ---See MR  Is this a behavioral health or substance abuse call? ---No     Guidelines    Guideline Title Affirmed Question Affirmed Notes  Chest Injury [1] High-risk adult (e.g., age > 9960, osteoporosis, chronic steroid use) AND [2] still hurts    Final Disposition User   See Physician within 24 Hours Standifer, RN, Research scientist (physical sciences)Heather    Comments  Appt with Dr. Reece AgarG tomorrow at 9am.   Referrals  REFERRED TO PCP OFFICE   Disagree/Comply: Comply

## 2015-08-21 NOTE — Telephone Encounter (Signed)
Will see then. 

## 2015-08-22 ENCOUNTER — Ambulatory Visit (INDEPENDENT_AMBULATORY_CARE_PROVIDER_SITE_OTHER)
Admission: RE | Admit: 2015-08-22 | Discharge: 2015-08-22 | Disposition: A | Discharge status: Home / Self Care | Payer: Self-pay | Source: Ambulatory Visit | Attending: Family Medicine | Admitting: Family Medicine

## 2015-08-22 ENCOUNTER — Encounter: Payer: Self-pay | Admitting: Family Medicine

## 2015-08-22 ENCOUNTER — Encounter (HOSPITAL_COMMUNITY): Payer: Self-pay | Admitting: Clinical

## 2015-08-22 ENCOUNTER — Ambulatory Visit (INDEPENDENT_AMBULATORY_CARE_PROVIDER_SITE_OTHER): Payer: No Typology Code available for payment source | Admitting: Clinical

## 2015-08-22 ENCOUNTER — Ambulatory Visit (INDEPENDENT_AMBULATORY_CARE_PROVIDER_SITE_OTHER): Payer: No Typology Code available for payment source | Admitting: Family Medicine

## 2015-08-22 VITALS — BP 120/78 | HR 84 | Temp 98.2°F | Wt 241.4 lb

## 2015-08-22 DIAGNOSIS — Z720 Tobacco use: Secondary | ICD-10-CM

## 2015-08-22 DIAGNOSIS — S299XXD Unspecified injury of thorax, subsequent encounter: Secondary | ICD-10-CM

## 2015-08-22 DIAGNOSIS — F1190 Opioid use, unspecified, uncomplicated: Secondary | ICD-10-CM

## 2015-08-22 DIAGNOSIS — R042 Hemoptysis: Secondary | ICD-10-CM

## 2015-08-22 DIAGNOSIS — J690 Pneumonitis due to inhalation of food and vomit: Secondary | ICD-10-CM

## 2015-08-22 DIAGNOSIS — IMO0002 Reserved for concepts with insufficient information to code with codable children: Secondary | ICD-10-CM

## 2015-08-22 DIAGNOSIS — F1121 Opioid dependence, in remission: Secondary | ICD-10-CM

## 2015-08-22 DIAGNOSIS — F1210 Cannabis abuse, uncomplicated: Secondary | ICD-10-CM

## 2015-08-22 DIAGNOSIS — F3131 Bipolar disorder, current episode depressed, mild: Secondary | ICD-10-CM

## 2015-08-22 DIAGNOSIS — N179 Acute kidney failure, unspecified: Secondary | ICD-10-CM

## 2015-08-22 DIAGNOSIS — R634 Abnormal weight loss: Secondary | ICD-10-CM

## 2015-08-22 DIAGNOSIS — F17200 Nicotine dependence, unspecified, uncomplicated: Secondary | ICD-10-CM

## 2015-08-22 DIAGNOSIS — F1290 Cannabis use, unspecified, uncomplicated: Secondary | ICD-10-CM

## 2015-08-22 DIAGNOSIS — F4310 Post-traumatic stress disorder, unspecified: Secondary | ICD-10-CM

## 2015-08-22 DIAGNOSIS — S298XXD Other specified injuries of thorax, subsequent encounter: Secondary | ICD-10-CM

## 2015-08-22 DIAGNOSIS — F1099 Alcohol use, unspecified with unspecified alcohol-induced disorder: Secondary | ICD-10-CM

## 2015-08-22 DIAGNOSIS — Z8619 Personal history of other infectious and parasitic diseases: Secondary | ICD-10-CM

## 2015-08-22 LAB — CBC WITH DIFFERENTIAL/PLATELET
BASOS ABS: 0 10*3/uL (ref 0.0–0.1)
Basophils Relative: 0.3 % (ref 0.0–3.0)
EOS ABS: 0.1 10*3/uL (ref 0.0–0.7)
Eosinophils Relative: 2 % (ref 0.0–5.0)
HCT: 41.7 % (ref 39.0–52.0)
Hemoglobin: 13.9 g/dL (ref 13.0–17.0)
LYMPHS ABS: 0.7 10*3/uL (ref 0.7–4.0)
Lymphocytes Relative: 10.2 % — ABNORMAL LOW (ref 12.0–46.0)
MCHC: 33.4 g/dL (ref 30.0–36.0)
MCV: 96 fl (ref 78.0–100.0)
MONO ABS: 0.7 10*3/uL (ref 0.1–1.0)
Monocytes Relative: 10.6 % (ref 3.0–12.0)
NEUTROS PCT: 76.9 % (ref 43.0–77.0)
Neutro Abs: 5.3 10*3/uL (ref 1.4–7.7)
Platelets: 93 10*3/uL — ABNORMAL LOW (ref 150.0–400.0)
RBC: 4.34 Mil/uL (ref 4.22–5.81)
RDW: 14.4 % (ref 11.5–15.5)
WBC: 6.9 10*3/uL (ref 4.0–10.5)

## 2015-08-22 LAB — RENAL FUNCTION PANEL
ALBUMIN: 3.7 g/dL (ref 3.5–5.2)
BUN: 12 mg/dL (ref 6–23)
CALCIUM: 9.6 mg/dL (ref 8.4–10.5)
CO2: 28 mEq/L (ref 19–32)
CREATININE: 0.78 mg/dL (ref 0.40–1.50)
Chloride: 96 mEq/L (ref 96–112)
GFR: 110.66 mL/min (ref >60.00)
GLUCOSE: 125 mg/dL — AB (ref 70–99)
Phosphorus: 2.4 mg/dL (ref 2.3–4.6)
Potassium: 4.5 mEq/L (ref 3.5–5.1)
SODIUM: 130 meq/L — AB (ref 135–145)

## 2015-08-22 LAB — URINE CULTURE

## 2015-08-22 MED ORDER — HYDROCODONE-ACETAMINOPHEN 5-325 MG PO TABS: 1.0000 | ORAL_TABLET | Freq: Three times a day (TID) | ORAL | Status: DC | PRN

## 2015-08-22 MED ORDER — CLINDAMYCIN HCL 300 MG PO CAPS: 300.0000 mg | ORAL_CAPSULE | Freq: Three times a day (TID) | ORAL | Status: DC

## 2015-08-22 NOTE — Progress Notes (Signed)
Pre visit review using our clinic review tool, if applicable. No additional management support is needed unless otherwise documented below in the visit note. 

## 2015-08-22 NOTE — Progress Notes (Signed)
   THERAPIST PROGRESS NOTE  Session Time: 1:32 - 2:30  Participation Level: Active  Behavioral Response: CasualAlertAnxious and Depressed - with wounds on face   Type of Therapy: Individual Therapy  Treatment Goals addressed: improve psychiatric symptoms, , Healthy coping skills ( adjust to life outside prison), Control manic and depressive symptoms(resume normal areas of functioning, Relapse prevention skills  Interventions: CBT and Motivational Interviewing, Grounding and Mindfulness Techniques, psychoeducation  Summary: Randy Barnes is a 55  y.o. male who presents with Bipolar affective disorder, currently depressed, mild, PTSD (post-traumatic stress disorder), Alcohol use disorder, Opium use disorder, severe, in sustained remission   Suicidal/Homicidal: No -without intent/plan  Therapist Response:  Randy Barnes met with clinician for an individual session. Randy Barnes discussed his psychiatric symptoms and his current life events. Randy Barnes shared that he was working in an attic on Friday and began to get heatstroke. He shared that he went to go down the ladder and fell and passed out. The man he was working with thought he was dying and so performed CPR which broke one of his ribs. He shared that while he sort is glad that the man was concerned enough to perform CPR. He shared in addition to this he has been concerned a bout his weight loss. He shared that he has lost 51 pounds and has no energy and is stressed. Client and clinician discussed him sharing this with his doctor. He said he is concerned that it is coming from his hep C bout hasn't yet been able to get help. Clinician asked open ended questions and Randy Barnes shared that he is doing his best to be successful outside of prison, but is having a difficult time. He shared that he has not committed crimes but finds it hard to occupy his time because all his physical limitations in addition to his background have made it difficult for him to find work. He  shared in addition he needs to move because his mother sold the house he is been living in and it closes on June 27. Client and clinician discussed healthy coping skills. Randy Barnes shared that he his given up liquor but does have a few beers a week. Client and clinician discussed harm reduction and relapse prevention. Client and clinician discussed saying no to those who are a negative influence on him and yes even small opportunities. Clinician gave Randy Barnes the #211 to call to find out what resources are available to him such as free meals and food pantries. Randy Barnes shared a bout trying to keep a positive attitude and client and clinician discussed how our thoughts influence our emotions and behavior.  Plan: Randy Barnes again in 1 weeks.  Diagnosis: Axis I: Bipolar affective disorder, currently depressed, mild, PTSD (post-traumatic stress disorder), Alcohol use disorder, Opium use disorder, severe, in sustained remission   Llewelyn Sheaffer A, LCSW 08/22/2015

## 2015-08-22 NOTE — Patient Instructions (Addendum)
Chest xray today and labwork today. Take hydrocodone 1 tablet up to three times daily for breakthrough pain, but start with tylenol scheduled 500mg  three times daily  - temporary treatment for the next 2 months. #60 provided today.  Start clindamycin three times daily to treat possible aspiration pneumonia. Take with yogurt - watch for ongoing diarrhea developing and let us know right away if this happens.  Return in 1 month for follow up visit, sooner if needed.

## 2015-08-22 NOTE — Progress Notes (Signed)
BP 120/78 mmHg  Pulse 84  Temp(Src) 98.2 F (36.8 C)  Wt 241 lb 6.4 oz (109.498 kg)  SpO2 96%   CC: ER f/u visit  Subjective:    Patient ID: Randy Barnes, male    DOB: [birth year], 55 y.o.   MRN: [id number]  HPI: Randy BattiestRoger D Marmo is a 55 y.o. male presenting on 08/22/2015 for Chest Injury   ER note reviewed. Passed out Friday - endorses due to heat stroke while working in [employer] (new job - heating and air) - fell off ladder onto face on carpet. Coworker performed CPR on him. Evaluated 2d later at ER - work up revealed multiple bilateral costochondral and R rib fractures, bilateral pulmonary opacities ?aspiration vs multifocal pneumonia, narrowing of L lower lobe bronchus, elevated lactic acid (2.74 --> 0.9 on discharge), AKI with Cr 1.74, WBC 11.5, hyponatremia to 126. blcx NGTD. CTA limited due to motion however no obvious PE found. Left AMA.   He did not receive any pain medications, not even tylenol, at ER.  He did receive antibiotics in ER - levaquin and vancomycin IV. He did take 2 pain pills from a friend.  Last night started spitting blood-tinged sputum. Denies fever/chills. ++ cough productive of mucous, marked chest wall pain to cough.   Relevant past medical, surgical, family and social history reviewed and updated as indicated. Interim medical history since our last visit reviewed. Allergies and medications reviewed and updated. Current Outpatient Prescriptions on File Prior to Visit  Medication Sig  . albuterol (VENTOLIN HFA) 108 (90 Base) MCG/ACT inhaler Inhale 2 puffs into the lungs every 6 (six) hours as needed for wheezing or shortness of breath.  Marland Kitchen. amitriptyline (ELAVIL) 100 MG tablet Take 1 tablet (100 mg total) by mouth at bedtime.  . ARIPiprazole (ABILIFY) 10 MG tablet Take 1 tablet (10 mg total) by mouth daily.  Marland Kitchen. gabapentin (NEURONTIN) 800 MG tablet Take 3,200 mg by mouth 4 (four) times daily as needed.  . Glucose Blood (BLOOD GLUCOSE TEST STRIPS) STRP Use as  directed  . lisinopril (PRINIVIL,ZESTRIL) 40 MG tablet Take 1 tablet (40 mg total) by mouth daily.  Marland Kitchen. omeprazole (PRILOSEC) 20 MG capsule Take 1 capsule (20 mg total) by mouth 2 (two) times daily before a meal.  . permethrin (ACTICIN) 5 % cream Apply 1 application topically once. Use as directed  . sildenafil (VIAGRA) 100 MG tablet Take 0.5-1 tablets (50-100 mg total) by mouth daily as needed for erectile dysfunction.  . tamsulosin (FLOMAX) 0.4 MG CAPS capsule Take twice a day   No current facility-administered medications on file prior to visit.    Review of Systems Per HPI unless specifically indicated in ROS section     Objective:    BP 120/78 mmHg  Pulse 84  Temp(Src) 98.2 F (36.8 C)  Wt 241 lb 6.4 oz (109.498 kg)  SpO2 96%  Wt Readings from Last 3 Encounters:  08/22/15 241 lb 6.4 oz (109.498 kg)  08/20/15 246 lb (111.585 kg)  08/08/15 246 lb 6.4 oz (111.766 kg)    Physical Exam  Constitutional: He appears well-developed and well-nourished. No distress.  HENT:  Head: Head is with abrasion.  Mouth/Throat: Oropharynx is clear and moist. No oropharyngeal exudate.  Multiple abrasions to face  Cardiovascular: Normal rate, regular rhythm, normal heart sounds and intact distal pulses.   No murmur heard. Pulmonary/Chest: No respiratory distress. He has decreased breath sounds. He has no wheezes. He has rhonchi. He has rales. He exhibits tenderness.  Marked tenderness to palpation R costochondral region as well as R anterior mid ribcage  Musculoskeletal: He exhibits no edema.  Nursing note and vitals reviewed.  Results for orders placed or performed in visit on 08/22/15  Renal function panel  Result Value Ref Range   Sodium 130 (L) 135 - 145 mEq/L   Potassium 4.5 3.5 - 5.1 mEq/L   Chloride 96 96 - 112 mEq/L   CO2 28 19 - 32 mEq/L   Calcium 9.6 8.4 - 10.5 mg/dL   Albumin 3.7 3.5 - 5.2 g/dL   BUN 12 6 - 23 mg/dL   Creatinine, Ser 6.96 0.40 - 1.50 mg/dL   Glucose, Bld  295 (H) 70 - 99 mg/dL   Phosphorus 2.4 2.3 - 4.6 mg/dL   GFR 284.13 >24.40 mL/min  CBC with Differential/Platelet  Result Value Ref Range   WBC 6.9 4.0 - 10.5 K/uL   RBC 4.34 4.22 - 5.81 Mil/uL   Hemoglobin 13.9 13.0 - 17.0 g/dL   HCT 10.2 72.5 - 36.6 %   MCV 96.0 78.0 - 100.0 fl   MCHC 33.4 30.0 - 36.0 g/dL   RDW 44.0 34.7 - 42.5 %   Platelets 93.0 (L) 150.0 - 400.0 K/uL   Neutrophils Relative % 76.9 43.0 - 77.0 %   Lymphocytes Relative 10.2 (L) 12.0 - 46.0 %   Monocytes Relative 10.6 3.0 - 12.0 %   Eosinophils Relative 2.0 0.0 - 5.0 %   Basophils Relative 0.3 0.0 - 3.0 %   Neutro Abs 5.3 1.4 - 7.7 K/uL   Lymphs Abs 0.7 0.7 - 4.0 K/uL   Monocytes Absolute 0.7 0.1 - 1.0 K/uL   Eosinophils Absolute 0.1 0.0 - 0.7 K/uL   Basophils Absolute 0.0 0.0 - 0.1 K/uL   CTA Lung IMPRESSION: 1. Evaluation for pulmonary emboli is limited due to motion and borderline contrast enhancement. However, within these limitations, no convincing pulmonary emboli are identified. If there is continued concern, a repeat CTA of the chest could be performed when the patient can better cooperate. 2. Multiple fractures through bilateral costochondral junctions and right-sided ribs without pneumothorax. 3. Bilateral pulmonary opacities are identified. The findings are nonspecific. However, based on history, aspiration should be a strong consideration. Multi focal pneumonia could have the same appearance. Recommend follow-up to resolution. 4. Narrowing of the left lower lobe bronchus may be secondary to bronchial wall thickening in aspiration. Recommend attention on follow-up. Electronically Signed  By: Gerome Sam III M.D  On: 08/20/2015 16:40    Assessment & Plan:   Problem List Items Addressed This Visit    History of hepatitis C virus infection    HCV Ab +, viral load undetectable (07/2015) Abd [place] 07/2015: IMPRESSION: 1. Contracted gallbladder without evidence of stones. The patient reports being  NPO as requested. Further evaluation of the gallbladder with a nuclear medicine hepatobiliary scan is recommended in an effort to exclude gallbladder dysfunction. 2. Heterogeneous hepatic echotexture without evidence of masses or ductal dilation or surface contour nodularity. 3. Splenomegaly likely secondary to hepatic dysfunction. 4. No significant abnormality observed elsewhere within the abdomen.      Hemoptysis    Presumed due to recent aspiration PNA and chest trauma.       Smoker    Continue to encourage cessation.      Loss of weight    Ongoing weight loss, in setting of recent fall with possible aspiration PNA. Continue to monitor.       Aspiration pneumonia of both  lungs (HCC) - Primary    CT scan concerning for aspiration pneumonia in setting of LOC. He did receive IV vanc/levaquin in ER, will prescribe clindamycin abx (PCN allergy) to cover for possible asp PNA as pt endorses ongoing productive cough. Nontoxic on exam today. Update CXR today.  Will discuss rpt CT scan to f/u opacities/LLL bronchial narrowing seen on recent limited CTA.      Relevant Medications   clindamycin (CLEOCIN) 300 MG capsule   Other Relevant Orders   DG Chest 2 View (Completed)   CBC with Differential/Platelet (Completed)   AKI (acute kidney injury) (HCC)    Repeat labs today - AKI likely prerenal from dehydration, anticipate resolution with IVF at ER on Sunday.       Relevant Orders   Renal function panel (Completed)   Multiple trauma to chest    Fall onto face after LOC presumed to heat exhaustion with syncope. He did receive CPR, likely where he sustained rib and costochondral junction fractures seen on CT scan. Discussed prolonged recovery over next 6-8 wks. Will provide with hydrocodone for pain control over next 2 weeks - prescribed #60 today to use TID PRN breakthrough pain after scheduled tylenol  TID. Latest LFTs stable.           Follow up plan: Return in about 4 weeks  (around 09/19/2015), or if symptoms worsen or fail to improve, for follow up visit.  Eustaquio Boyden, MD

## 2015-08-23 DIAGNOSIS — J690 Pneumonitis due to inhalation of food and vomit: Secondary | ICD-10-CM | POA: Insufficient documentation

## 2015-08-23 DIAGNOSIS — S299XXA Unspecified injury of thorax, initial encounter: Secondary | ICD-10-CM | POA: Insufficient documentation

## 2015-08-23 DIAGNOSIS — N179 Acute kidney failure, unspecified: Secondary | ICD-10-CM | POA: Insufficient documentation

## 2015-08-23 NOTE — Assessment & Plan Note (Signed)
Presumed due to recent aspiration PNA and chest trauma.

## 2015-08-23 NOTE — Assessment & Plan Note (Signed)
Repeat labs today - AKI likely prerenal from dehydration, anticipate resolution with IVF at ER on Sunday.

## 2015-08-23 NOTE — Assessment & Plan Note (Signed)
Ongoing weight loss, in setting of recent fall with possible aspiration PNA. Continue to monitor.

## 2015-08-23 NOTE — Assessment & Plan Note (Signed)
Continue to encourage cessation. 

## 2015-08-23 NOTE — Assessment & Plan Note (Signed)
Fall onto face after LOC presumed to heat exhaustion with syncope. He did receive CPR, likely where he sustained rib and costochondral junction fractures seen on CT scan. Discussed prolonged recovery over next 6-8 wks. Will provide with hydrocodone for pain control over next 2 weeks - prescribed #60 today to use TID PRN breakthrough pain after scheduled tylenol 500mg  TID. Latest LFTs stable.

## 2015-08-23 NOTE — Assessment & Plan Note (Addendum)
CT scan concerning for aspiration pneumonia in setting of LOC. He did receive IV vanc/levaquin in ER, will prescribe clindamycin abx (PCN allergy) to cover for possible asp PNA as pt endorses ongoing productive cough. Nontoxic on exam today. Update CXR today.  Will discuss rpt CT scan to f/u opacities/LLL bronchial narrowing seen on recent limited CTA.

## 2015-08-23 NOTE — Assessment & Plan Note (Addendum)
HCV Ab +, viral load undetectable (07/2015) Abd [place] 07/2015: IMPRESSION: 1. Contracted gallbladder without evidence of stones. The patient reports being NPO as requested. Further evaluation of the gallbladder with a nuclear medicine hepatobiliary scan is recommended in an effort to exclude gallbladder dysfunction. 2. Heterogeneous hepatic echotexture without evidence of masses or ductal dilation or surface contour nodularity. 3. Splenomegaly likely secondary to hepatic dysfunction. 4. No significant abnormality observed elsewhere within the abdomen.

## 2015-08-25 ENCOUNTER — Other Ambulatory Visit: Payer: Self-pay | Admitting: Family Medicine

## 2015-08-26 LAB — CULTURE, BLOOD (ROUTINE X 2)
CULTURE: NO GROWTH
Culture: NO GROWTH

## 2015-08-28 ENCOUNTER — Encounter: Payer: Self-pay | Admitting: Internal Medicine

## 2015-08-28 NOTE — Telephone Encounter (Signed)
Ok to refill 

## 2015-09-11 ENCOUNTER — Other Ambulatory Visit: Payer: Self-pay

## 2015-09-11 NOTE — Telephone Encounter (Signed)
Pt left v/m requesting rx hydrocodone apap. Call when ready for pick up. Pt last seen and rx last printed # 60 on 08/22/15. 

## 2015-09-12 MED ORDER — HYDROCODONE-ACETAMINOPHEN 5-325 MG PO TABS: 1.0000 | ORAL_TABLET | Freq: Three times a day (TID) | ORAL | Status: DC | PRN

## 2015-09-12 NOTE — Telephone Encounter (Signed)
Pt left v/m requesting status of hydrocodone apap. Pt in a lot of pain and request cb ASAP.

## 2015-09-12 NOTE — Telephone Encounter (Signed)
Printed and in Kim's box 

## 2015-09-12 NOTE — Telephone Encounter (Signed)
Left voicemail letting pt know Rx ready for pick-up, also advise on voicemail that there is a 24-48 hr time frame for Rxs

## 2015-09-18 ENCOUNTER — Encounter (HOSPITAL_COMMUNITY): Payer: Self-pay | Admitting: Clinical

## 2015-09-18 ENCOUNTER — Ambulatory Visit (INDEPENDENT_AMBULATORY_CARE_PROVIDER_SITE_OTHER): Payer: No Typology Code available for payment source | Admitting: Clinical

## 2015-09-18 DIAGNOSIS — F4310 Post-traumatic stress disorder, unspecified: Secondary | ICD-10-CM

## 2015-09-18 DIAGNOSIS — F1210 Cannabis abuse, uncomplicated: Secondary | ICD-10-CM

## 2015-09-18 DIAGNOSIS — F1120 Opioid dependence, uncomplicated: Secondary | ICD-10-CM

## 2015-09-18 DIAGNOSIS — F3131 Bipolar disorder, current episode depressed, mild: Secondary | ICD-10-CM

## 2015-09-18 DIAGNOSIS — F1290 Cannabis use, unspecified, uncomplicated: Secondary | ICD-10-CM

## 2015-09-18 DIAGNOSIS — IMO0002 Reserved for concepts with insufficient information to code with codable children: Secondary | ICD-10-CM

## 2015-09-18 DIAGNOSIS — F1099 Alcohol use, unspecified with unspecified alcohol-induced disorder: Secondary | ICD-10-CM

## 2015-09-18 NOTE — Progress Notes (Signed)
   THERAPIST PROGRESS NOTE  Session Time: 1:33 - 2:29   Participation Level: Active  Behavioral Response: CasualAlertAnxious and Depressed  Type of Therapy: Individual Therapy  Treatment Goals addressed: improve psychiatric symptoms,  Healthy coping skills ( adjust to life outside prison), Control manic and depressive symptoms, Relapse prevention skills  Interventions: CBT and Motivational Interviewing, Grounding and Mindfulness Techniques, psychoeducation  Summary: Randy Barnes is a 55  y.o. male who presents with Bipolar affective disorder, currently depressed, mild, PTSD (post-traumatic stress disorder), Alcohol use disorder, Opium use disorder  Suicidal/Homicidal: No -without intent/plan  Therapist Response:  Randy Barnes met with clinician for an individual session. Randy Barnes discussed his psychiatric symptoms and his current life events. Randy Barnes shared that he has relapsed on Heroin and has become homeless. Clinician asked open ended questions and Randy Barnes shared about his relapse and how he became homeless. He shared that he is having difficulty coping with the stresses. Randy Barnes shared his fears that he will ended up going back to prison.  Client and clinician discussed some options for detox (including [facility] and possible options after detox). Clinician shared that this route could provide substance abuse treatment and housing. He shared that he thought he would like to try methadone first.  He stated he is really worried about getting sick. Client and clinician discussed the information he that he had about methadone.  Clinician asked where he would get the money for Methadone (14 dollars a day).  Randy Barnes shared he was working on housing but he would need to "get straight" first. Clinician helped him fill out some paperwork. Client and clinician reviewed some of his options for treatment. Clinician offered to make some calls but Randy Barnes stated he wanted to wait. Client and clinician discussed healthy coping  skills and threats to his physical freedom.    Plan: Randy Barnes again in 1 weeks.  Diagnosis: Axis I: Bipolar affective disorder, currently depressed, mild, PTSD (post-traumatic stress disorder), Alcohol use disorder, Opium use disorder   Randy Barnes A, LCSW 09/18/2015

## 2015-09-21 ENCOUNTER — Ambulatory Visit: Payer: Self-pay | Admitting: Family Medicine

## 2015-09-27 ENCOUNTER — Other Ambulatory Visit: Payer: No Typology Code available for payment source

## 2015-09-27 DIAGNOSIS — B182 Chronic viral hepatitis C: Secondary | ICD-10-CM

## 2015-09-27 LAB — HEPATITIS B CORE ANTIBODY, TOTAL: Hep B Core Total Ab: NONREACTIVE

## 2015-09-27 LAB — HEPATITIS A ANTIBODY, TOTAL: Hep A Total Ab: REACTIVE — AB

## 2015-09-27 LAB — HEPATITIS B SURFACE ANTIBODY,QUALITATIVE: Hep B S Ab: POSITIVE — AB

## 2015-09-27 LAB — HEPATITIS B SURFACE ANTIGEN: HEP B S AG: NEGATIVE

## 2015-09-28 LAB — PROTIME-INR
INR: 1
PROTHROMBIN TIME: 10.8 s (ref 9.0–11.5)

## 2015-09-28 LAB — HIV ANTIBODY (ROUTINE TESTING W REFLEX): HIV 1&2 Ab, 4th Generation: NONREACTIVE

## 2015-09-28 LAB — AFP TUMOR MARKER: AFP TUMOR MARKER: 3 ng/mL (ref <6.1)

## 2015-09-30 LAB — LIVER FIBROSIS, FIBROTEST-ACTITEST
ALPHA-2-MACROGLOBULIN: 299 mg/dL — AB (ref 106–279)
ALT: 17 U/L (ref 9–46)
Apolipoprotein A1: 122 mg/dL (ref 94–176)
BILIRUBIN: 0.6 mg/dL (ref 0.2–1.2)
FIBROSIS SCORE: 0.68
GGT: 130 U/L — AB (ref 3–95)
Haptoglobin: 116 mg/dL (ref 43–212)
NECROINFLAMMAT ACT SCORE: 0.1
Reference ID: 1576918

## 2015-09-30 LAB — HCV RNA,LIPA RFLX NS5A DRUG RESIST

## 2015-10-04 LAB — HCV RNA NS5A DRUG RESISTANCE

## 2015-10-09 ENCOUNTER — Ambulatory Visit (HOSPITAL_COMMUNITY): Payer: Self-pay | Admitting: Psychiatry

## 2015-10-10 ENCOUNTER — Ambulatory Visit (INDEPENDENT_AMBULATORY_CARE_PROVIDER_SITE_OTHER): Payer: No Typology Code available for payment source | Admitting: Clinical

## 2015-10-10 ENCOUNTER — Encounter (HOSPITAL_COMMUNITY): Payer: Self-pay | Admitting: Clinical

## 2015-10-10 DIAGNOSIS — F1120 Opioid dependence, uncomplicated: Secondary | ICD-10-CM

## 2015-10-10 DIAGNOSIS — F3131 Bipolar disorder, current episode depressed, mild: Secondary | ICD-10-CM

## 2015-10-10 DIAGNOSIS — F4310 Post-traumatic stress disorder, unspecified: Secondary | ICD-10-CM

## 2015-10-10 NOTE — Progress Notes (Signed)
   THERAPIST PROGRESS NOTE  Session Time: 12:03 - 1:15  Participation Level: Active  Behavioral Response: DisheveledAlertDepressed  Type of Therapy: Individual Therapy  Treatment Goals addressed: improve psychiatric symptoms,  Healthy coping skills ( adjust to life outside prison),  Relapse prevention skills  Interventions: Motivational Interviewing,   Summary: Randy Barnes is a 55  y.o. male who presents with Bipolar affective disorder, currently depressed, mild, PTSD (post-traumatic stress disorder), Alcohol use disorder, Opium use disorder, severe,   Suicidal/Homicidal: No -without intent/plan  Therapist Response:  Randy Barnes met with clinician for an individual session. Randy Barnes discussed his psychiatric symptoms and his current life events. Randy Barnes shared that he has been unable to stop using heroin, He shared that he is now homeless too because the man he was staying with has gone into hospice for brain cancer. Clinician offered to call [facility] for detox, she told him he is likely to need medical clearance to be admitted which could be done at the hospital. Randy Barnes showed clinician he had a card for them. He shared he looked into methadone at [facility] but they also advised [facility] for detox.  Client and clinician discussed the path he was on and the likely outcome. Randy Barnes projected that if he didn't go to detox he would end up in trouble with the law. He shared that he is thinking of calling [facility] but had some things to take care of. He shared he was having trouble getting his medication. Clinician suggested he call the nurse when she was back from lunch as she could help. Clinician also informed nurse of his need (after session) and she agreed to help him. Clinician asked open ended questions about his desired outcome. Randy Barnes shared he wanted to be clean and living successfully in the general population. He identified the fact that he would need assistance (detox etc) to do so. Clinician stated that she would help  him if he would allow her to. Randy Barnes acknowledged that help was being offered.  Plan: [place] again in 1 weeks.  Diagnosis: Axis I: Bipolar affective disorder, currently depressed, mild, PTSD (post-traumatic stress disorder), Alcohol use disorder, Opium use disorder, severe,     Randy Barnes A, LCSW 10/10/2015

## 2015-10-12 ENCOUNTER — Telehealth: Payer: Self-pay | Admitting: *Deleted

## 2015-10-12 NOTE — Telephone Encounter (Signed)
I have spoken to patient to advise of Dr Lauro Franklin recommendations. He has been scheduled with Hyacinth Meeker, PA-C on 10/20/15 prior to having a procedure. He verbalizes understanding of this and has also been advised that we are cancelling his previsit on 10/20/15 in lieu of office visit.

## 2015-10-12 NOTE — Telephone Encounter (Signed)
Randy Barnes, thank you for this note Given complexity of medical issues in the last several months he would be appropriate for an office visit with me or advanced practitioner prior to procedure

## 2015-10-12 NOTE — Telephone Encounter (Signed)
Pt scheduled by Karen Kitchens, CMA for Pyrtle with Victorino Dike 8-4 at 315. Pv cancelled. Pt aware of this change Hilda Lias PV

## 2015-10-12 NOTE — Telephone Encounter (Signed)
Dr Rhea Belton,  This patient is scheduled for a PV 8-4 with a direct colon with you 8-17 Thursday , he has no GI hx. He was seen in the Ed 6-4 with chest pain. He passed out and fell off a ladder 6-2 Friday and a co worker had to perform CPR. 2 days later, 6-4 to ED with constant chest pain.  CXR showed ? bilateral aspiration vs multifocal pneumonia, he also had some rib fractures from the CPR. In the Ed 6-4 he received IV vanc and levaquin, they attempted to admit him and he left AMA. He saw Dr Sharen Hones 6-6 for spitting up blood tinged sputum and was given oral abx and told to f/u in 1 month , which he did not.  He is scheduled 8-17 for colon.  Is it okay to proceed with this as scheduled? Please advise, thanks Hilda Lias PV

## 2015-10-17 DIAGNOSIS — F1120 Opioid dependence, uncomplicated: Secondary | ICD-10-CM

## 2015-10-17 DIAGNOSIS — F1920 Other psychoactive substance dependence, uncomplicated: Secondary | ICD-10-CM

## 2015-10-17 HISTORY — DX: Opioid dependence, uncomplicated: F11.20

## 2015-10-17 HISTORY — DX: Other psychoactive substance dependence, uncomplicated: F19.20

## 2015-10-18 ENCOUNTER — Other Ambulatory Visit: Payer: Self-pay | Admitting: Internal Medicine

## 2015-10-18 ENCOUNTER — Ambulatory Visit (INDEPENDENT_AMBULATORY_CARE_PROVIDER_SITE_OTHER): Payer: Self-pay | Admitting: Internal Medicine

## 2015-10-18 ENCOUNTER — Other Ambulatory Visit: Payer: Self-pay

## 2015-10-18 ENCOUNTER — Encounter: Payer: Self-pay | Admitting: Internal Medicine

## 2015-10-18 VITALS — BP 149/92 | HR 73 | Temp 97.8°F | Ht 74.0 in | Wt 212.0 lb

## 2015-10-18 DIAGNOSIS — B182 Chronic viral hepatitis C: Secondary | ICD-10-CM

## 2015-10-18 MED ORDER — ELBASVIR-GRAZOPREVIR 50-100 MG PO TABS: 1.0000 | ORAL_TABLET | Freq: Every day | ORAL | 2 refills | Status: DC

## 2015-10-18 NOTE — Progress Notes (Signed)
[facility]   CC: consideration for treatment for chronic hepatitis C  HPI:  +Randy Barnes is a 55 y.o. male who presents for initial evaluation and management of chronic hepatitis C.  Patient tested positive over 20 years ago while in jail. Hepatitis C-associated risk factors present are: IV drug abuse (details: in the 1990s). Patient denies multiple sexual partners, renal dialysis, sexual contact with person with liver disease. Patient has had other studies performed. Results: hepatitis C RNA by PCR, result: positive. Patient has not had prior treatment for Hepatitis C. Patient does not have a past history of liver disease. Patient does not have a family history of liver disease. Patient does not  have associated signs or symptoms related to liver disease.  Labs reviewed and confirm chronic hepatitis C with a positive viral load.   Records reviewed from PCP.  Positive hepatitis C.    He also has relapsed with heroin and is going to get into a drug treatment program.  Is very interested in stopping but gets withdrawal symptoms.   Also with low level viral load though genotype still positive     Patient does have documented immunity to Hepatitis A. Patient does have documented immunity to Hepatitis B.    Review of Systems:  Constitutional: positive for fatigue or negative for weight loss Gastrointestinal: negative for diarrhea Musculoskeletal: positive for back pain, negative for myalgias All other systems reviewed and are negative      Past Medical History:  Diagnosis Date  . Anxiety disorder due to general medical condition with panic attack    h/o hospitalizations for behavioral issues (1980s, 2000s)  . Aorto-iliac atherosclerosis (HCC)   . DDD (degenerative disc disease), lumbar    mild with dextroscoliosis  . Emphysema of lung (HCC)    ?asthmatic bronchitis per prior PCP  . GERD (gastroesophageal reflux disease)   . Hepatitis C   . History of  stomach ulcers   . Hyperlipidemia   . Hypertension   . Macular degeneration   . MDD (major depressive disorder), recurrent episode, moderate (HCC)   . 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 Problems related to release from prison 07/2014  . Psoriasis 01/08/2015  . Type 2 diabetes, uncontrolled, with neuropathy (HCC)   . Urine incontinence     Prior to Admission medications   Medication Sig Start Date End Date Taking? Authorizing Provider  albuterol (VENTOLIN HFA) 108 (90 Base) MCG/ACT inhaler Inhale 2 puffs into the lungs every 6 (six) hours as needed for wheezing or shortness of breath. 05/23/15  Yes Eustaquio Boyden, MD  amitriptyline (ELAVIL) 100 MG tablet TAKE ONE TABLET BY MOUTH AT BEDTIME 08/28/15  Yes Eustaquio Boyden, MD  gabapentin (NEURONTIN) 800 MG tablet Take 3,200 mg by mouth 4 (four) times daily as needed.   Yes Historical Provider, MD  Glucose Blood (BLOOD GLUCOSE TEST STRIPS) STRP Use as directed 04/28/15  Yes Eustaquio Boyden, MD  HYDROcodone-acetaminophen (NORCO/VICODIN) 5-325 MG tablet Take 1 tablet by mouth 3 (three) times daily as needed for moderate pain. 09/12/15  Yes Eustaquio Boyden, MD  ibuprofen (ADVIL,MOTRIN) 800 MG tablet Take 1 tablet (800 mg total) by mouth every 8 (eight) hours as needed. 08/22/15  Yes Eustaquio Boyden, MD  lisinopril (PRINIVIL,ZESTRIL) 40 MG tablet Take 1 tablet (40 mg total) by mouth daily. 06/19/15  Yes Eustaquio Boyden, MD  omeprazole (PRILOSEC) 20 MG capsule Take 1  capsule (20 mg total) by mouth 2 (two) times daily before a meal. 07/24/15  Yes Eustaquio Boyden, MD  tamsulosin [facility]) 0.4 MG CAPS capsule Take twice a day 02/21/15  Yes Eustaquio Boyden, MD  ARIPiprazole (ABILIFY) 10 MG tablet Take 1 tablet (10 mg total) by mouth daily. Patient not taking: Reported on 09/18/2015 08/08/15 08/07/16  Cleotis Nipper, MD  Elbasvir-Grazoprevir (ZEPATIER) 50-100 MG TABS Take 1 tablet by mouth daily. 10/18/15   Gardiner Barefoot, MD  permethrin (ACTICIN) 5 % cream Apply 1 application topically once. Use as directed Patient not taking: Reported on 10/18/2015 08/09/15   Eustaquio Boyden, MD  sildenafil (VIAGRA) 100 MG tablet Take 0.5-1 tablets (50-100 mg total) by mouth daily as needed for erectile dysfunction. Patient not taking: Reported on 10/18/2015 07/28/15   Eustaquio Boyden, MD    Allergies; penicillin  Social History  Substance Use Topics  . Smoking status: Current Every Day Smoker    Packs/day: 1.00    Years: 29.00    Types: Cigarettes    Start date: 03/18/1973  . Smokeless tobacco: Former Neurosurgeon    Types: Snuff     Comment: Has tried dip in the past some  . Alcohol use 3.6 oz/week    6 Cans of beer per week     Comment: occasional, not regular    Family History  Problem Relation Age of Onset  . Diabetes Father   . Heart failure Father   . CAD Father   . CAD Paternal Grandfather   . Stroke Mother   . Hypertension Mother   . Mental illness Mother   . Depression Mother   . Stroke Maternal Grandfather   . Bipolar disorder Brother     ?  No liver cirrhosis, no liver cancer   Objective:  Constitutional: in no apparent distress and alert,  Vitals:   10/18/15 0948  BP: (!) 149/92  Pulse: 73  Temp: 97.8 F (36.6 C)   Eyes: anicteric Cardiovascular: Cor RRR Respiratory: CTA B; normal respiratory effort Gastrointestinal: Bowel sounds are normal, liver is not enlarged, spleen is not enlarged Musculoskeletal: no pedal edema noted Skin: negatives: no rash; no porphyria cutanea tarda Lymphatic: no cervical lymphadenopathy   Laboratory Genotype: No results found for: HCVGENOTYPE HCV viral load:  Lab Results  Component Value Date   HCVQUANT <15 07/28/2015   Lab Results  Component Value Date   WBC 6.9 08/22/2015   HGB 13.9 08/22/2015   HCT 41.7 08/22/2015   MCV 96.0 08/22/2015   PLT 93.0 (L) 08/22/2015    Lab Results  Component Value Date   CREATININE 0.78 08/22/2015   BUN 12  08/22/2015   NA 130 (L) 08/22/2015   K 4.5 08/22/2015   CL 96 08/22/2015   CO2 28 08/22/2015    Lab Results  Component Value Date   ALT 17 09/27/2015   AST 130 (H) 08/20/2015   ALKPHOS 59 08/20/2015     Labs and history reviewed and show CHILD-PUGH A  5-6 points: Child class A 7-9 points: Child class B 10-15 points: Child class C  Lab Results  Component Value Date   INR 1.0 09/27/2015   BILITOT 1.4 (H) 08/20/2015   ALBUMIN 3.7 08/22/2015     Assessment: New Patient with Chronic Hepatitis C genotype 1a, untreated.  I discussed with the patient the lab findings that confirm chronic hepatitis C as well as the natural history and progression of disease including about 30% of people who develop cirrhosis  of the liver if left untreated and once cirrhosis is established there is a 2-7% risk per year of liver cancer and liver failure.  I discussed the importance of treatment and benefits in reducing the risk, even if significant liver fibrosis exists.   Plan: 1) Patient counseled extensively on limiting acetaminophen to no more than 2 grams daily, avoidance of alcohol. 2) Transmission discussed with patient including sexual transmission, sharing razors and toothbrush.   3) Will need referral to gastroenterology if concern for cirrhosis 4) Will need referral for substance abuse counseling: No.; has a Veterinary surgeon and is getting into a program; Further work up to include urine drug screen  No. 5) Will prescribe Zepatier for 12 weeks though will confirm if active or not first since initial viral load was negatvie 6) Hepatitis A vaccine No. 7) Hepatitis B vaccine No. 8) Pneumovax vaccine if concern for cirrhosis 9) Further work up to include liver staging with elastography 10) NS5A test  Yes.  , is unknown 11) will follow up after drug rehab and starting medication.  He can start once he calls to inform us he is drug free.   Though he has a genotype suggesting active virus, his viral load  is undetectable which suggests he is clearing his infection, which also suggests he recently was infected.  Will recheck today and then consider treatment after that, once he is drug free.

## 2015-10-18 NOTE — Patient Instructions (Signed)
Date 10/18/15  Dear Mr. Haston, As discussed in the ID Clinic, your hepatitis C therapy will include the following medications:          Zepatier (elbasvir 50 mg/grazoprevir 100 mg) for 12 weeks               Please note that ALL MEDICATIONS WILL START ON THE SAME DATE for a total of 12 weeks. ---------------------------------------------------------------- Your HCV Treatment Start Date: TBA   Your HCV genotype:  1a    Liver Fibrosis: F3    ---------------------------------------------------------------- YOUR PHARMACY CONTACT:   [facility] [address] Phone: [phone] Hours: Monday to Friday 7:30 am to 6:00 pm   Please always contact your pharmacy at least 3-4 business days before you run out of medications to ensure your next month's medication is ready or 1 week prior to running out if you receive it by mail.  Remember, each prescription is for 28 days. ---------------------------------------------------------------- GENERAL NOTES REGARDING YOUR HEPATITIS C MEDICATION:  ZEPATIER is available as a beige-colored, oval-shaped, film-coated tablet debossed with "770" on one side and plain on the other. Each tablet contains 50 mg elbasvir and 100 mg grazoprevir.   Common side effects of ZEPATIER when used without ribavirin include: - feeling tired -trouble sleeping - headache -diarrhea - nausea  Common side effects of ZEPATIER when used with ribavirin include: - low red blood cell counts (anemia) - feeling irritable - headache - stomach pain - feeling tired - depression - shortness of breath - joint pain - rash or itching   Please note that this only lists the most common side effects and is NOT a comprehensive list of the potential side effects of these medications. For more information, please review the drug information sheets that come with your medication package from the pharmacy.   ---------------------------------------------------------------- GENERAL HELPFUL HINTS ON HCV THERAPY: 1. Stay well-hydrated. 2. Notify the ID Clinic of any changes in your other over-the-counter/herbal or prescription medications. 3. If you miss a dose of your medication, take the missed dose as soon as you remember. Return to your regular time/dose schedule the next day.  4.  Do not stop taking your medications without first talking with your healthcare provider. 5.  You may take Tylenol (acetaminophen), as long as the dose is less than 2000 mg (OR no more than 4 tablets of the Tylenol Extra Strengths 500mg  tablet) in 24 hours. 6.  You will see our pharmacist-specialist within the first 2 weeks of starting your medication. 7.  You will need to obtain routine labs around week 4 and12 weeks after starting and then 3 to 6 months after finishing Zepatier.   8.  If ribavirin is part of your regimen, you also will have a lab visit within 2 weeks.   Staci Righter, MD  [facility] [address] [place] [address] [place], [place]  [address] [phone]

## 2015-10-20 ENCOUNTER — Encounter: Payer: Self-pay | Admitting: Physician Assistant

## 2015-10-20 ENCOUNTER — Ambulatory Visit (INDEPENDENT_AMBULATORY_CARE_PROVIDER_SITE_OTHER): Payer: Self-pay | Admitting: Physician Assistant

## 2015-10-20 VITALS — BP 130/70 | HR 72 | Ht 74.0 in | Wt 207.0 lb

## 2015-10-20 DIAGNOSIS — K219 Gastro-esophageal reflux disease without esophagitis: Secondary | ICD-10-CM

## 2015-10-20 DIAGNOSIS — F1910 Other psychoactive substance abuse, uncomplicated: Secondary | ICD-10-CM

## 2015-10-20 DIAGNOSIS — K5900 Constipation, unspecified: Secondary | ICD-10-CM

## 2015-10-20 LAB — HEPATITIS C RNA QUANTITATIVE
HCV QUANT: 521 [IU]/mL — AB (ref <15)
HCV Quantitative Log: 2.72 {Log} — ABNORMAL HIGH (ref <1.18)

## 2015-10-20 MED ORDER — OMEPRAZOLE 40 MG PO CPDR: 40.0000 mg | DELAYED_RELEASE_CAPSULE | Freq: Two times a day (BID) | ORAL | 3 refills | Status: DC

## 2015-10-20 NOTE — Patient Instructions (Addendum)
We have sent the following medications to your pharmacy for you to pick up at your convenience: Omeprazole 40 mg twice daily (30-60 minutes before eating)  Take Miralax as needed for constipation.  Please follow up with Dr Rhea Belton in the office on Tuesday, 01/16/16 @ 3:00 pm. We will discuss endoscopy/colonoscopy at that time.  If you are age 53 or older, your body mass index should be between 23-30. Your Body mass index is 26.58 kg/m. If this is out of the aforementioned range listed, please consider follow up with your Primary Care Provider.  If you are age 29 or younger, your body mass index should be between 19-25. Your Body mass index is 26.58 kg/m. If this is out of the aformentioned range listed, please consider follow up with your Primary Care Provider.
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
## 2015-10-21 ENCOUNTER — Encounter: Payer: Self-pay | Admitting: Family Medicine

## 2015-10-21 ENCOUNTER — Other Ambulatory Visit: Payer: Self-pay | Admitting: Pharmacist Clinician (PhC)/ Clinical Pharmacy Specialist

## 2015-10-21 DIAGNOSIS — B182 Chronic viral hepatitis C: Secondary | ICD-10-CM

## 2015-10-23 ENCOUNTER — Telehealth: Payer: Self-pay | Admitting: Pharmacy Technician

## 2015-10-30 ENCOUNTER — Encounter: Payer: Self-pay | Admitting: Family Medicine

## 2015-11-02 ENCOUNTER — Encounter: Payer: Self-pay | Admitting: Internal Medicine

## 2015-11-02 LAB — HCV VIRAL RNA GEN3 NS5A DRUG RESIST: HCV NS5a Subtype: NOT DETECTED

## 2015-11-06 ENCOUNTER — Ambulatory Visit: Payer: Self-pay | Admitting: Family Medicine

## 2015-11-23 ENCOUNTER — Ambulatory Visit (HOSPITAL_COMMUNITY): Payer: Self-pay | Admitting: Psychiatry

## 2015-11-30 ENCOUNTER — Ambulatory Visit (INDEPENDENT_AMBULATORY_CARE_PROVIDER_SITE_OTHER): Payer: Self-pay | Admitting: Internal Medicine

## 2015-11-30 ENCOUNTER — Encounter: Payer: Self-pay | Admitting: Internal Medicine

## 2015-11-30 VITALS — BP 148/80 | HR 81 | Temp 98.0°F | Ht 74.0 in | Wt 213.0 lb

## 2015-11-30 DIAGNOSIS — Z789 Other specified health status: Secondary | ICD-10-CM

## 2015-11-30 DIAGNOSIS — Z7289 Other problems related to lifestyle: Secondary | ICD-10-CM

## 2015-11-30 DIAGNOSIS — B182 Chronic viral hepatitis C: Secondary | ICD-10-CM

## 2015-11-30 NOTE — Progress Notes (Signed)
   Subjective:    Patient ID: Randy Barnes, male    DOB: 01-Mar-1961, 55 y.o.   MRN: [id number]  HPI  Here for follow up of HCV.   Was infected in the 1990s but never treated and never had much of an evaluation.  Saw his PCP and had a viral load < 15 but was detectable.  I repeated it and it was just 521 copies.  He recently relapsed with drugs and now continues to be drug free. He had tried to get into drug rehab but was not able to.  No new issues.     Review of Systems  Constitutional: Negative for fatigue.  Skin: Negative for rash.  Neurological: Negative for dizziness.       Objective:   Physical Exam  Constitutional: He appears well-developed and well-nourished.  HENT:  Poor dentition  Skin: No rash noted.          Assessment & Plan:

## 2015-11-30 NOTE — Assessment & Plan Note (Signed)
Minimal virus so will continue to watch. May have been recently infected. Repeat vl today and rtc 6 months for repeat again

## 2015-11-30 NOTE — Assessment & Plan Note (Signed)
Remaining drug and alcohol free after recent relapse.

## 2015-12-01 LAB — HEPATITIS C RNA QUANTITATIVE
HCV Quantitative Log: 2.5 {Log} — ABNORMAL HIGH (ref <1.18)
HCV Quantitative: 317 IU/mL — ABNORMAL HIGH (ref <15)

## 2015-12-14 ENCOUNTER — Ambulatory Visit (HOSPITAL_COMMUNITY): Payer: Self-pay | Admitting: Clinical

## 2016-01-16 ENCOUNTER — Ambulatory Visit: Payer: Self-pay | Admitting: Internal Medicine

## 2016-01-18 ENCOUNTER — Telehealth: Payer: Self-pay | Admitting: *Deleted

## 2016-01-18 NOTE — Telephone Encounter (Signed)
No show letter mailed to patient. 

## 2016-03-12 DIAGNOSIS — H2511 Age-related nuclear cataract, right eye: Secondary | ICD-10-CM | POA: Insufficient documentation

## 2016-04-30 NOTE — Telephone Encounter (Signed)
l °

## 2016-06-03 ENCOUNTER — Ambulatory Visit: Payer: Self-pay | Admitting: Internal Medicine

## 2016-06-04 ENCOUNTER — Telehealth (INDEPENDENT_AMBULATORY_CARE_PROVIDER_SITE_OTHER): Payer: Self-pay | Admitting: Orthopaedic Surgery

## 2016-07-13 IMAGING — MR MR LUMBAR SPINE W/O CM
4 of 5 series · 18 of 48 positions shown · non-contrast
Comparison: Radiography 01/26/2015

CLINICAL DATA: Chronic low back pain with radiculopathy (bilateral
leg pain with numbness and tingling). History of injection in 1549
with little relief

EXAM:
MRI LUMBAR SPINE WITHOUT CONTRAST
TECHNIQUE: Multiplanar, multisequence MR imaging of the lumbar spine was
performed. No intravenous contrast was administered.

[Series 4: T1 · sagittal · 4.0mm · 0.55mm/px · 3 of 14 slices shown (1 of 2)]
[im 3/14]
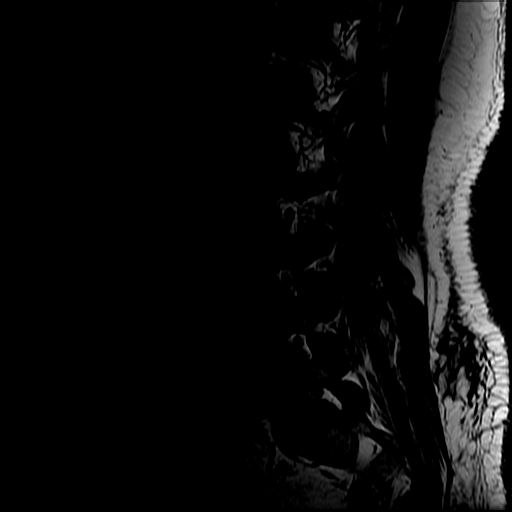
[im 8/14]
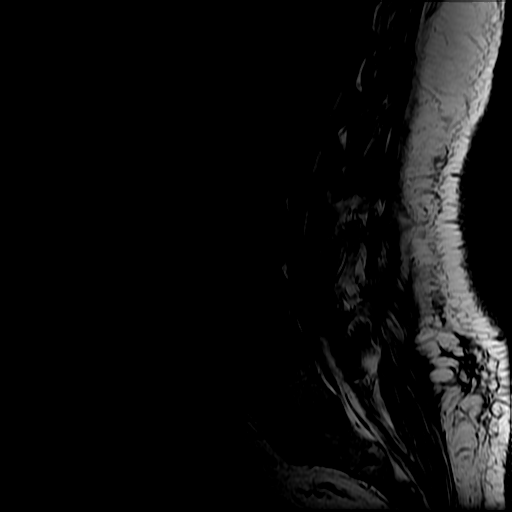
[im 14/14]
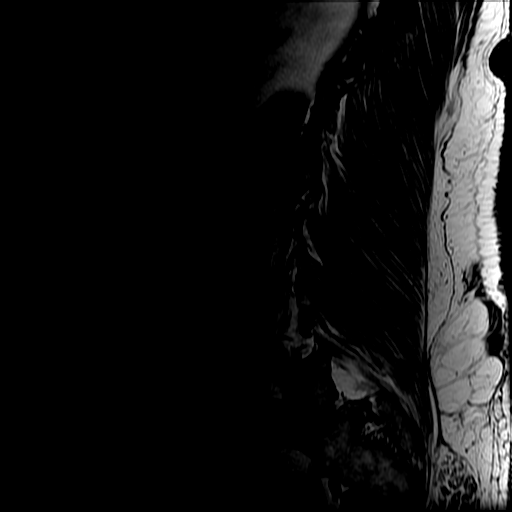

[Series 5: T2 post-contrast · sagittal · 4.0mm · 0.55mm/px · 5 of 14 slices shown]
[im 1/14]
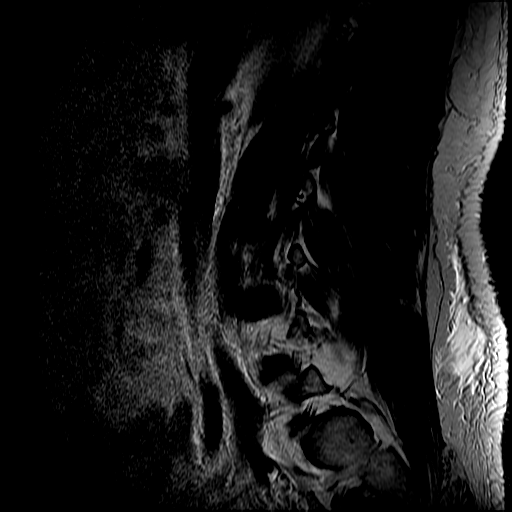
[im 4/14]
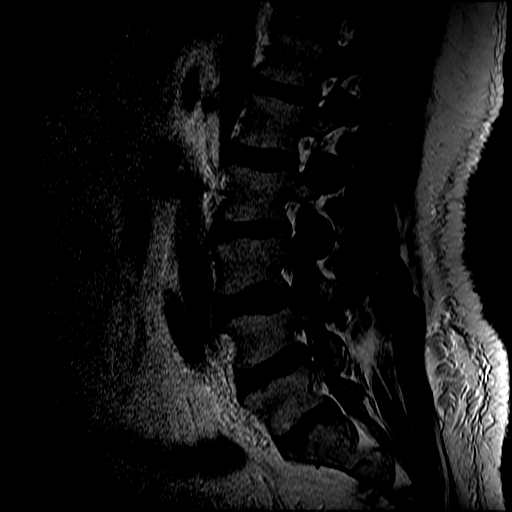
[im 7/14]
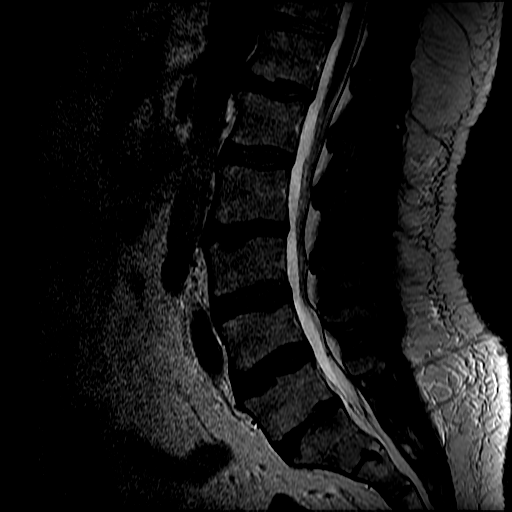
[im 10/14]
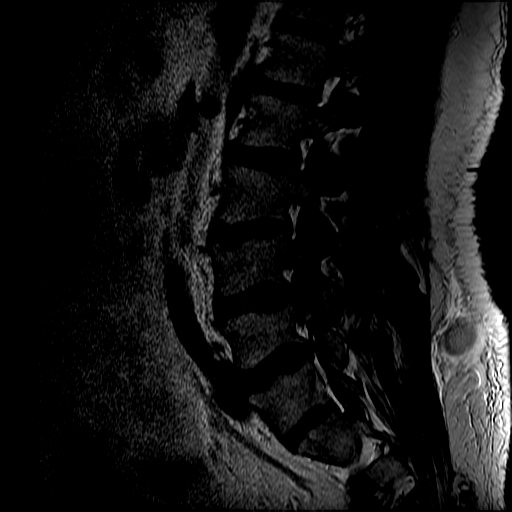
[im 14/14]
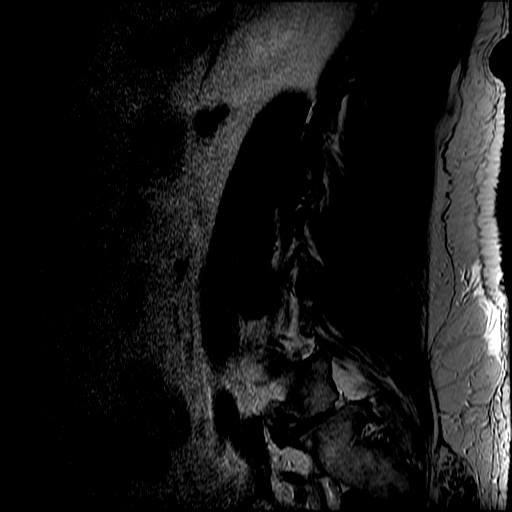

[Series 7: T2 · axial · 4.0mm · 0.39mm/px · z∈[-87,+112]mm · 7 of 44 slices shown]
[im 3/44]
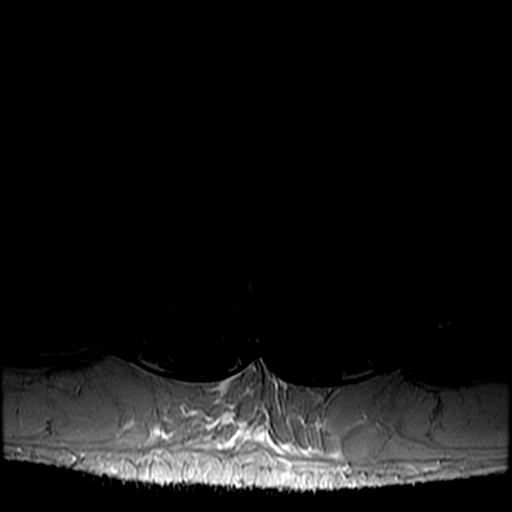
[im 6/44]
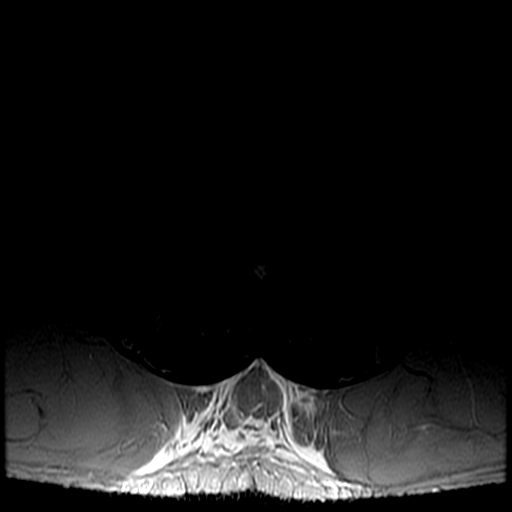
[im 9/44]
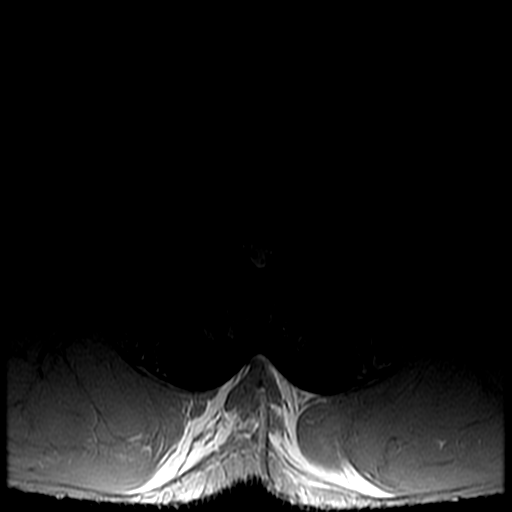
[im 15/44]
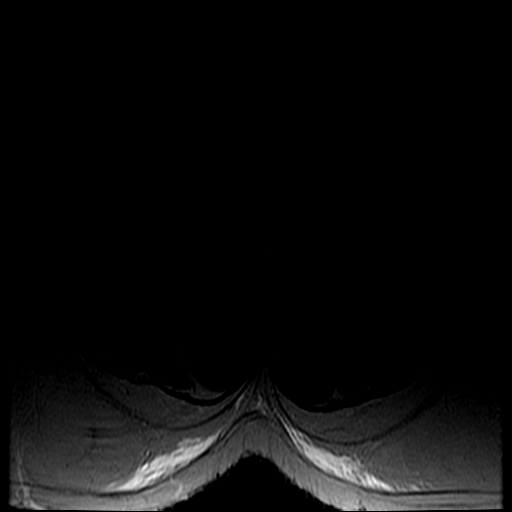
[im 21/44]
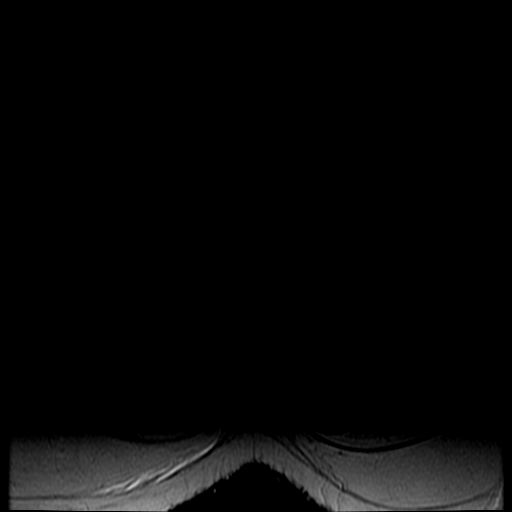
[im 23/44]
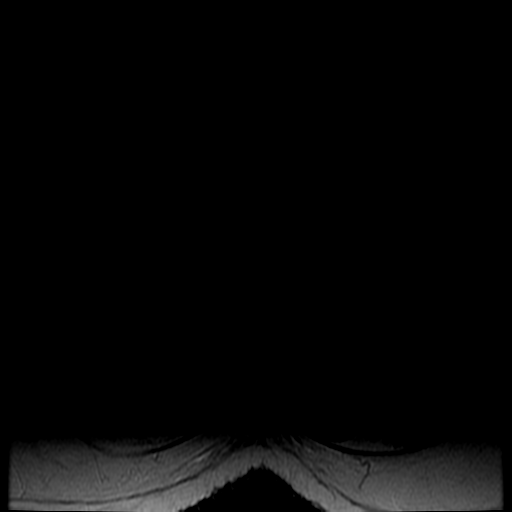
[im 38/44]
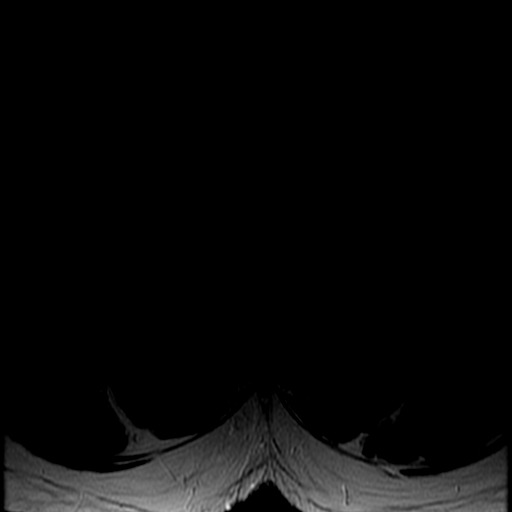

[Series 8: T1 · axial · 4.0mm · 0.39mm/px · z∈[-73,+112]mm · 3 of 44 slices shown (2 of 2)]
[im 6/44]
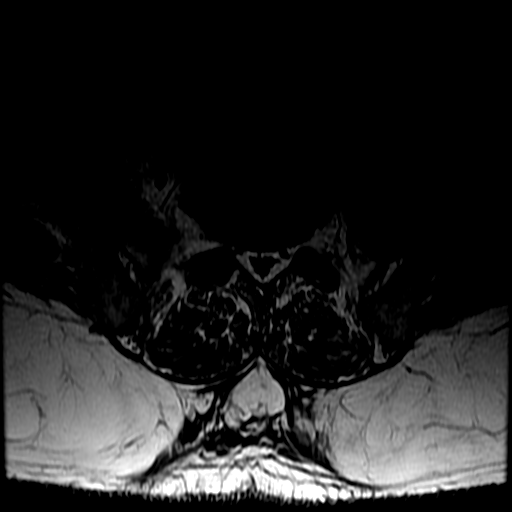
[im 23/44]
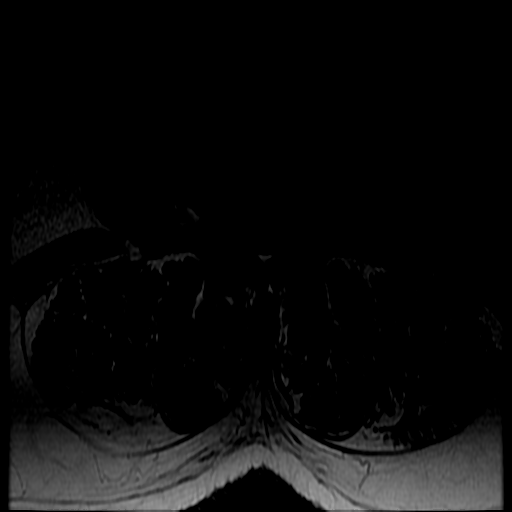
[im 38/44]
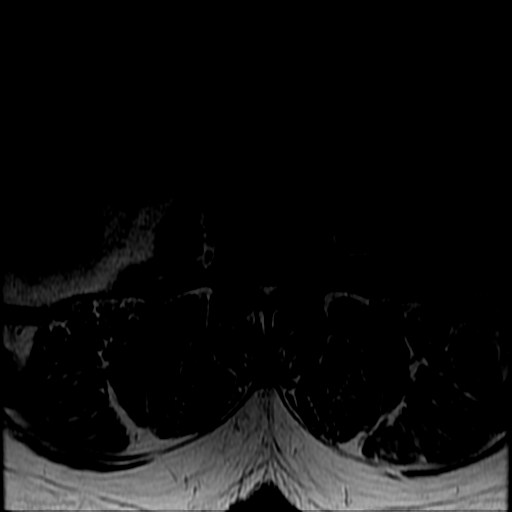

[18 of 48 positions shown; findings below may reference images not displayed]

FINDINGS: Transitional lumbosacral anatomy. The lowest open disc space is
numbered S1-S2 based on the number of non rib-bearing vertebrae on
previous radiography and 12 ribs on 01/09/2015 chest x-ray.

No marrow signal abnormality suggestive of fracture, infection, or
neoplasm. Normal conus signal and morphology. No perispinal
abnormality to explain back pain.

Degenerative changes which are superimposed on a congenitally narrow
spinal canal from short pedicles:

T12- L1: No herniation or impingement

L1-L2: No herniation or impingement

L2-L3: No herniation or impingement

L3-L4: Mild disc narrowing and desiccation. Probable early
degenerative facet arthropathy. AP narrowing of the thecal sac,
predominately congenital, without compression.

L4-L5: Facet arthropathy with moderate overgrowth. Mild annulus
bulging. Congenital and degenerative factors cause triangular
deformation of the thecal sac which is overall mild. Subarticular
recesses are effaced but the descending L5 nerve roots are medial to
the stenosis. Mild left foraminal narrowing but no complete
effacement of perineural fat.

L5-S1:Mild annulus bulging and endplate spurring. Mild facet
arthropathy. Ventral thecal sac flattening without impingement.

S1-S2:  Spondylotic spurring.  No herniation or stenosis.
IMPRESSION: 1. Transitional lumbosacral anatomy with open S1-2 disc space, as
described above. If intervention is planned recommend careful level
determination.
2. Congenitally narrow spinal canal from short pedicles with overall
mild superimposed degenerative change. No high-grade stenosis or
impingement.
3. Focally advanced L4-5 facet arthropathy with bulky spurring.

## 2018-05-21 ENCOUNTER — Encounter (HOSPITAL_COMMUNITY): Payer: Self-pay | Admitting: Emergency Medicine

## 2018-05-21 ENCOUNTER — Ambulatory Visit (HOSPITAL_COMMUNITY)
Admission: EM | Admit: 2018-05-21 | Discharge: 2018-05-21 | Disposition: A | Discharge status: Home / Self Care | Payer: Medicaid Other | Attending: Family Medicine | Admitting: Family Medicine

## 2018-05-21 DIAGNOSIS — M5441 Lumbago with sciatica, right side: Secondary | ICD-10-CM

## 2018-05-21 DIAGNOSIS — M5442 Lumbago with sciatica, left side: Secondary | ICD-10-CM

## 2018-05-21 DIAGNOSIS — I10 Essential (primary) hypertension: Secondary | ICD-10-CM | POA: Diagnosis not present

## 2018-05-21 DIAGNOSIS — J449 Chronic obstructive pulmonary disease, unspecified: Secondary | ICD-10-CM

## 2018-05-21 DIAGNOSIS — N401 Enlarged prostate with lower urinary tract symptoms: Secondary | ICD-10-CM

## 2018-05-21 DIAGNOSIS — K219 Gastro-esophageal reflux disease without esophagitis: Secondary | ICD-10-CM | POA: Diagnosis not present

## 2018-05-21 DIAGNOSIS — F17219 Nicotine dependence, cigarettes, with unspecified nicotine-induced disorders: Secondary | ICD-10-CM

## 2018-05-21 DIAGNOSIS — R351 Nocturia: Secondary | ICD-10-CM

## 2018-05-21 DIAGNOSIS — F5101 Primary insomnia: Secondary | ICD-10-CM

## 2018-05-21 DIAGNOSIS — G5793 Unspecified mononeuropathy of bilateral lower limbs: Secondary | ICD-10-CM | POA: Diagnosis not present

## 2018-05-21 DIAGNOSIS — G8929 Other chronic pain: Secondary | ICD-10-CM

## 2018-05-21 MED ORDER — AMITRIPTYLINE HCL 100 MG PO TABS: 100.0000 mg | ORAL_TABLET | Freq: Every evening | ORAL | 1 refills | Status: DC | PRN

## 2018-05-21 MED ORDER — ATENOLOL 25 MG PO TABS: 25.0000 mg | ORAL_TABLET | Freq: Every day | ORAL | 2 refills | Status: DC

## 2018-05-21 MED ORDER — ALBUTEROL SULFATE HFA 108 (90 BASE) MCG/ACT IN AERS: 2.0000 | INHALATION_SPRAY | Freq: Four times a day (QID) | RESPIRATORY_TRACT | 2 refills | Status: DC | PRN

## 2018-05-21 MED ORDER — OMEPRAZOLE 20 MG PO CPDR: 20.0000 mg | DELAYED_RELEASE_CAPSULE | Freq: Two times a day (BID) | ORAL | 2 refills | Status: DC

## 2018-05-21 MED ORDER — TAMSULOSIN HCL 0.4 MG PO CAPS: ORAL_CAPSULE | ORAL | 2 refills | Status: DC

## 2018-05-21 MED ORDER — GABAPENTIN 800 MG PO TABS: 800.0000 mg | ORAL_TABLET | Freq: Four times a day (QID) | ORAL | 2 refills | Status: DC

## 2018-05-21 MED ORDER — LISINOPRIL 10 MG PO TABS: 10.0000 mg | ORAL_TABLET | Freq: Every day | ORAL | 2 refills | Status: DC

## 2018-05-21 MED ORDER — DICLOFENAC SODIUM 75 MG PO TBEC: 75.0000 mg | DELAYED_RELEASE_TABLET | Freq: Two times a day (BID) | ORAL | 0 refills | Status: DC | PRN

## 2018-05-21 NOTE — Discharge Instructions (Addendum)
Recommend start Voltaren 75mg  twice a day as needed for pain. Restart all your medication- getting back on Gabapentin and Amitriptyline at night should help with nerve pain. Continue blood pressure medication- continue to monitor blood pressure. May restart Albuterol inhaler as directed. Recommend follow-up with a PCP for medication management of chronic health issues.

## 2018-05-21 NOTE — ED Triage Notes (Signed)
Pt sts needs meds refilled and also sts chronic back pain more severe today

## 2018-05-21 NOTE — ED Provider Notes (Signed)
[facility]    CSN: [id number] Arrival date & time: 05/21/18  1000     History   Chief Complaint Chief Complaint  Patient presents with  . Medication Refill  . Back Pain    HPI Randy Barnes is a 58 y.o. male.   58 year old male presents for refill on his chronic medication. He has history of HTN, COPD, GERD, hyperlipidemia,BPH, depression and anxiety, degenerative disc disease- lumbar, lower leg neuropathy, Hepatitis C, insomnia, polysubstance abuse and type 2 diabetes that is now controlled with diet. He has been in prison and had substance abuse issues and was homeless. He was living in [place] in the past year but could not obtain transportation to get healthcare and other needs. He recently moved to [place] in the past month and requests refills on all his medications. He was just approved for Medicaid but does not have a local PCP yet. He has lost significant weight and no longer on diabetic medication and they originally took him off blood pressure medication but restarted when they found aorto-iliac arthrosclerosis. He continues to smoke cigarettes daily but denies any illicit drug use. He does continue to drink alcohol. He is having a flare up of his chronic back pain due to being off medication for about 1 week. He took his last blood pressure medication today. Current medication include Lisinopril 10mg , Atenolol 25mg , Neurontin 800mg  QID, Flomax 0.4mg  BID, Prilosec 20mg  BID, Elavil 100mg  prn for sleep and Albuterol inhaler prn.  Family history of heart disease and diabetes in Dad and HTN and mood disorder for Mom.   The history is provided by the patient.  Medication Refill  Reason for request:  Clinic/provider not available, medications ran out and prescriptions expired Medications taken before: yes - see home medications   Patient has complete original prescription information: yes     Past Medical History:  Diagnosis Date  . Anxiety disorder due to  general medical condition with panic attack    h/o hospitalizations for behavioral issues (1980s, 2000s)  . Aorto-iliac atherosclerosis (HCC)   . Arthritis   . Cataract   . COPD (chronic obstructive pulmonary disease) (HCC)   . DDD (degenerative disc disease), lumbar    mild with dextroscoliosis  . Emphysema of lung (HCC)    ?asthmatic bronchitis per prior PCP  . GERD (gastroesophageal reflux disease)   . Hepatitis C   . History of stomach ulcers   . Hyperlipidemia   . Hypertension   . Macular degeneration   . MDD (major depressive disorder), recurrent episode, moderate (HCC)   . Polysubstance (including opioids) dependence, daily use (HCC) 10/2015   narcotics, heroin  . 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 Problems related to release from prison 07/2014  . Psoriasis 01/08/2015  . Type 2 diabetes, uncontrolled, with neuropathy (HCC)   . Urine incontinence     Patient Active Problem List   Diagnosis Date Noted  . Aspiration pneumonia of both lungs (HCC) 08/23/2015  . AKI (acute kidney injury) (HCC) 08/23/2015  . Multiple trauma to chest 08/23/2015  . Health maintenance examination 07/28/2015  . Loss of weight 07/28/2015  . Skin rash 07/28/2015  . Cough 06/13/2015  . Erectile dysfunction 06/13/2015  . Cataract 06/13/2015  . Aorto-iliac atherosclerosis (HCC)   . Smoker 01/26/2015  . Alcohol use 01/26/2015  . Hemoptysis 01/11/2015  . MDD (major depressive disorder), recurrent episode, moderate (HCC)   .  Psoriasis 01/08/2015  . Macular degeneration   . Chronic low back pain 12/26/2014  . Emphysema of lung (HCC)   . Chronic hepatitis C without hepatic coma (HCC)   . Anxiety disorder due to general medical condition with panic attack   . GERD (gastroesophageal reflux disease)   . Hyperlipidemia   . Controlled diabetes mellitus with diabetic neuropathy (HCC) 09/16/2014  . Essential hypertension 09/16/2014  . BPH (benign  prostatic hyperplasia) 09/16/2014    Past Surgical History:  Procedure Laterality Date  . STRABISMUS SURGERY     left eye vision loss, multiple surgeries  . TONSILLECTOMY         Home Medications    Prior to Admission medications   Medication Sig Start Date End Date Taking? Authorizing Provider  albuterol (VENTOLIN HFA) 108 (90 Base) MCG/ACT inhaler Inhale 2 puffs into the lungs every 6 (six) hours as needed for wheezing or shortness of breath. 05/21/18   Sudie Grumbling, NP  amitriptyline (ELAVIL) 100 MG tablet Take 1 tablet (100 mg total) by mouth at bedtime as needed for sleep. 05/21/18   Sudie Grumbling, NP  atenolol (TENORMIN) 25 MG tablet Take 1 tablet (25 mg total) by mouth daily. 05/21/18   Sudie Grumbling, NP  diclofenac (VOLTAREN) 75 MG EC tablet Take 1 tablet (75 mg total) by mouth 2 (two) times daily as needed for moderate pain. 05/21/18   Sudie Grumbling, NP  gabapentin (NEURONTIN) 800 MG tablet Take 1 tablet (800 mg total) by mouth 4 (four) times daily. 05/21/18   Sudie Grumbling, NP  Glucose Blood (BLOOD GLUCOSE TEST STRIPS) STRP Use as directed 04/28/15   Eustaquio Boyden, MD  lisinopril (PRINIVIL,ZESTRIL) 10 MG tablet Take 1 tablet (10 mg total) by mouth daily. 05/21/18   Sudie Grumbling, NP  omeprazole (PRILOSEC) 20 MG capsule Take 1 capsule (20 mg total) by mouth 2 (two) times daily before a meal. 05/21/18   Jaylon Boylen, Ali Lowe, NP  tamsulosin (FLOMAX) 0.4 MG CAPS capsule Take 1 capsule by mouth twice a day 05/21/18   Sudie Grumbling, NP    Family History Family History  Problem Relation Age of Onset  . Diabetes Father   . Heart failure Father   . CAD Father   . Stroke Mother   . Hypertension Mother   . Mental illness Mother   . Depression Mother   . Irritable bowel syndrome Mother   . Bipolar disorder Brother        ?  Marland Kitchen CAD Paternal Grandfather   . Stroke Maternal Grandfather     Social History Social History   Tobacco Use  . Smoking status: Current Every Day  Smoker    Packs/day: 1.00    Years: 29.00    Pack years: 29.00    Types: Cigarettes    Start date: 03/18/1973  . Smokeless tobacco: Former Neurosurgeon    Types: Snuff  . Tobacco comment: Has tried dip in the past some, quit for 7 year   Substance Use Topics  . Alcohol use: Yes    Alcohol/week: 6.0 standard drinks    Types: 6 Cans of beer per week    Comment: occasional, not regular  . Drug use: No    Types: Marijuana, IV, Heroin, Oxycodone, Hydrocodone    Comment: 09/25/15 last use     Allergies   Penicillins and Guaifenesin & derivatives   Review of Systems Review of Systems  Constitutional: Negative for activity change, appetite change, chills,  fatigue and fever.  HENT: Negative for nosebleeds, sore throat and trouble swallowing.   Eyes: Positive for visual disturbance (macular degeneration).  Respiratory: Positive for cough, shortness of breath and wheezing. Negative for chest tightness.   Cardiovascular: Negative for chest pain and palpitations.  Gastrointestinal: Negative for abdominal pain, nausea and vomiting.  Genitourinary: Negative for dysuria, flank pain and hematuria.  Musculoskeletal: Positive for arthralgias, back pain, gait problem and myalgias. Negative for neck pain and neck stiffness.  Skin: Negative for color change, rash and wound.  Allergic/Immunologic: Positive for immunocompromised state.  Neurological: Positive for numbness. Negative for dizziness, tremors, seizures, syncope, facial asymmetry, speech difficulty, weakness, light-headedness and headaches.  Hematological: Negative for adenopathy. Does not bruise/bleed easily.  Psychiatric/Behavioral: Positive for sleep disturbance. The patient is nervous/anxious.      Physical Exam Triage Vital Signs ED Triage Vitals [05/21/18 1048]  Enc Vitals Group     BP (!) 114/55     Pulse Rate 80     Resp 18     Temp 97.7 F (36.5 C)     Temp Source Temporal     SpO2 94 %     Weight      Height      Head  Circumference      Peak Flow      Pain Score 8     Pain Loc      Pain Edu?      Excl. in GC?    No data found.  Updated Vital Signs BP (!) 114/55 (BP Location: Right Arm)   Pulse 80   Temp 97.7 F (36.5 C) (Temporal)   Resp 18   SpO2 94%   Visual Acuity Right Eye Distance:   Left Eye Distance:   Bilateral Distance:    Right Eye Near:   Left Eye Near:    Bilateral Near:     Physical Exam Vitals signs and nursing note reviewed.  Constitutional:      General: He is awake. He is not in acute distress.    Appearance: He is well-developed. He is not ill-appearing.     Comments: Patient is sitting comfortably on exam table in no acute distress but appears disheveled.   HENT:     Head: Normocephalic and atraumatic.     Right Ear: Hearing, tympanic membrane, ear canal and external ear normal.     Left Ear: Hearing, tympanic membrane, ear canal and external ear normal.     Nose: Nose normal.     Mouth/Throat:     Lips: Pink.     Mouth: Mucous membranes are moist.  Eyes:     Conjunctiva/sclera: Conjunctivae normal.  Neck:     Musculoskeletal: Normal range of motion and neck supple. No neck rigidity or muscular tenderness.  Cardiovascular:     Rate and Rhythm: Normal rate and regular rhythm.     Heart sounds: Normal heart sounds. No murmur.  Pulmonary:     Effort: Pulmonary effort is normal. No respiratory distress.     Breath sounds: Normal air entry. No stridor or decreased air movement. Examination of the right-upper field reveals decreased breath sounds and wheezing. Examination of the left-upper field reveals decreased breath sounds and wheezing. Examination of the right-lower field reveals decreased breath sounds and wheezing. Examination of the left-lower field reveals decreased breath sounds and wheezing. Decreased breath sounds and wheezing present. No rhonchi or rales.  Musculoskeletal:        General: Deformity present. No swelling or tenderness.  Lumbar back: He  exhibits decreased range of motion, deformity (thoracic and lumbar scoliosis) and pain. He exhibits no tenderness, no swelling, no edema and no spasm.       Back:     Right lower leg: No edema.     Left lower leg: No edema.     Comments: Has decreased ROM of lumbar back, especially with flexion and rotation. No distinct tenderness to palpation but pain present on entire lower lumbar area. No swelling, redness or bruising seen. Slight decreased sensation of lower legs and feet. Good distal pulses.   Skin:    General: Skin is warm and dry.     Capillary Refill: Capillary refill takes less than 2 seconds.     Findings: No bruising, erythema or rash.  Neurological:     Mental Status: He is alert and oriented to person, place, and time.     Sensory: Sensory deficit (lower legs and feet bilaterally have decreased sensation) present.     Motor: Motor function is intact.     Gait: Gait abnormal.     Deep Tendon Reflexes: Reflexes are normal and symmetric.     Comments: Difficulty with gait due to decreased sensation and neuropathy in lower legs and feet along with scoliosis and lower back pain.   Psychiatric:        Attention and Perception: Attention normal. He does not perceive auditory or visual hallucinations.        Mood and Affect: Mood is anxious.        Speech: Speech is rapid and pressured.        Behavior: Behavior is not aggressive or combative. Behavior is cooperative.        Thought Content: Thought content normal. Thought content is not paranoid or delusional. Thought content does not include homicidal or suicidal ideation.        Cognition and Memory: Cognition and memory normal.      UC Treatments / Results  Labs (all labs ordered are listed, but only abnormal results are displayed) Labs Reviewed - No data to display  EKG None  Radiology No results found.  Procedures Procedures (including critical care time)  Medications Ordered in UC Medications - No data to  display  Initial Impression / Assessment and Plan / UC Course  I have reviewed the triage vital signs and the nursing notes.  Pertinent labs & imaging results that were available during my care of the patient were reviewed by me and considered in my medical decision making (see chart for details).    Discussed with patient that we will refill all his current medication for 3 months but he needs to contact Medicaid to make sure he is established with a local PCP within the next 2 to 3 months. Continue Lisinopril, Atenolol, Flomax, Prilosec and Albuterol as previously prescribed. Continue to monitor blood pressure since diastolic is low today. Restart Elavil- recommend take 50mg  at bedtime for the next 3 to 5 days then increase to 100mg  nightly. May trial Voltaren 75mg  twice a day as needed for pain. Restart Neurontin 800mg  4 times a day. Discussed that he may need to decrease this dose at first but may titrate up to his previous dose as tolerated. Neurontin and Elavil should help with his nerve pain. Continue to monitor sugar levels. Strongly encouraged to decrease smoking. Follow-up with a PCP ASAP for management of chronic health issues.   Final Clinical Impressions(s) / UC Diagnoses   Final diagnoses:  Essential hypertension  Neuropathy of both feet  Chronic bilateral low back pain with bilateral sciatica  Gastroesophageal reflux disease, esophagitis presence not specified  BPH associated with nocturia  Primary insomnia  Chronic obstructive pulmonary disease, unspecified COPD type (HCC)  Cigarette nicotine dependence with nicotine-induced disorder     Discharge Instructions     Recommend start Voltaren  twice a day as needed for pain. Restart all your medication- getting back on Gabapentin and Amitriptyline at night should help with nerve pain. Continue blood pressure medication- continue to monitor blood pressure. May restart Albuterol inhaler as directed. Recommend follow-up with a  PCP for medication management of chronic health issues.     ED Prescriptions    Medication Sig Dispense Auth. Provider   albuterol (VENTOLIN HFA) 108 (90 Base) MCG/ACT inhaler Inhale 2 puffs into the lungs every 6 (six) hours as needed for wheezing or shortness of breath. 1 Inhaler Alexcia Schools, Ali Lowe, NP   tamsulosin (FLOMAX) 0.4 MG CAPS capsule Take 1 capsule by mouth twice a day 60 capsule Erastus Bartolomei, Ali Lowe, NP   lisinopril (PRINIVIL,ZESTRIL) 10 MG tablet Take 1 tablet (10 mg total) by mouth daily. 30 tablet Sudie Grumbling, NP   atenolol (TENORMIN) 25 MG tablet Take 1 tablet (25 mg total) by mouth daily. 30 tablet Sudie Grumbling, NP   gabapentin (NEURONTIN) 800 MG tablet Take 1 tablet (800 mg total) by mouth 4 (four) times daily. 120 tablet Sudie Grumbling, NP   amitriptyline (ELAVIL) 100 MG tablet Take 1 tablet (100 mg total) by mouth at bedtime as needed for sleep. 30 tablet Sudie Grumbling, NP   omeprazole (PRILOSEC) 20 MG capsule Take 1 capsule (20 mg total) by mouth 2 (two) times daily before a meal. 60 capsule Kameelah Minish, Ali Lowe, NP   diclofenac (VOLTAREN) 75 MG EC tablet Take 1 tablet (75 mg total) by mouth 2 (two) times daily as needed for moderate pain. 60 tablet Sudie Grumbling, NP     Controlled Substance Prescriptions [place] Controlled Substance Registry consulted? Yes, I have consulted the [place] Controlled Substances Registry for this patient to determine if he had any controlled substances that have been prescribed and he has not mentioned. No active Rx in the past 2 years. No controlled substances prescribed today.    Sudie Grumbling, NP 05/22/18 1110

## 2018-07-06 ENCOUNTER — Encounter (HOSPITAL_COMMUNITY): Payer: Self-pay | Admitting: Emergency Medicine

## 2018-07-06 ENCOUNTER — Other Ambulatory Visit: Payer: Self-pay

## 2018-07-06 ENCOUNTER — Telehealth: Payer: Self-pay | Admitting: *Deleted

## 2018-07-06 ENCOUNTER — Emergency Department (HOSPITAL_COMMUNITY)
Admission: EM | Admit: 2018-07-06 | Discharge: 2018-07-06 | Disposition: A | Discharge status: Home / Self Care | Payer: Medicaid Other | Attending: Emergency Medicine | Admitting: Emergency Medicine

## 2018-07-06 DIAGNOSIS — F17210 Nicotine dependence, cigarettes, uncomplicated: Secondary | ICD-10-CM | POA: Diagnosis not present

## 2018-07-06 DIAGNOSIS — I10 Essential (primary) hypertension: Secondary | ICD-10-CM | POA: Diagnosis not present

## 2018-07-06 DIAGNOSIS — Z590 Homelessness: Secondary | ICD-10-CM | POA: Diagnosis not present

## 2018-07-06 DIAGNOSIS — M5441 Lumbago with sciatica, right side: Secondary | ICD-10-CM | POA: Diagnosis not present

## 2018-07-06 DIAGNOSIS — E1140 Type 2 diabetes mellitus with diabetic neuropathy, unspecified: Secondary | ICD-10-CM | POA: Diagnosis not present

## 2018-07-06 DIAGNOSIS — L84 Corns and callosities: Secondary | ICD-10-CM | POA: Diagnosis not present

## 2018-07-06 DIAGNOSIS — M545 Low back pain: Secondary | ICD-10-CM | POA: Diagnosis present

## 2018-07-06 DIAGNOSIS — M5442 Lumbago with sciatica, left side: Secondary | ICD-10-CM | POA: Diagnosis not present

## 2018-07-06 DIAGNOSIS — G8929 Other chronic pain: Secondary | ICD-10-CM

## 2018-07-06 DIAGNOSIS — Z79899 Other long term (current) drug therapy: Secondary | ICD-10-CM | POA: Insufficient documentation

## 2018-07-06 MED ORDER — ACETAMINOPHEN 500 MG PO TABS
1000.0000 mg | ORAL_TABLET | Freq: Once | ORAL | Status: AC
  Administered 2018-07-06: 1000 mg via ORAL
  Filled 2018-07-06: qty 2

## 2018-07-06 MED ORDER — ACETAMINOPHEN 500 MG PO TABS: 500.0000 mg | ORAL_TABLET | Freq: Four times a day (QID) | ORAL | 0 refills | Status: DC | PRN

## 2018-07-06 MED ORDER — IBUPROFEN 400 MG PO TABS: 400.0000 mg | ORAL_TABLET | Freq: Three times a day (TID) | ORAL | 0 refills | Status: DC | PRN

## 2018-07-06 MED ORDER — KETOROLAC TROMETHAMINE 60 MG/2ML IM SOLN
15.0000 mg | Freq: Once | INTRAMUSCULAR | Status: AC
  Administered 2018-07-06: 15 mg via INTRAMUSCULAR
  Filled 2018-07-06: qty 2

## 2018-07-06 MED ORDER — SALICYLIC ACID 17 % EX SOLN: Freq: Every day | CUTANEOUS | 0 refills | Status: DC

## 2018-07-06 MED ORDER — DIAZEPAM 5 MG PO TABS
5.0000 mg | ORAL_TABLET | Freq: Once | ORAL | Status: AC
  Administered 2018-07-06: 5 mg via ORAL
  Filled 2018-07-06: qty 1

## 2018-07-06 MED ORDER — OXYCODONE HCL 5 MG PO TABS
5.0000 mg | ORAL_TABLET | Freq: Once | ORAL | Status: AC
  Administered 2018-07-06: 5 mg via ORAL
  Filled 2018-07-06: qty 1

## 2018-07-06 NOTE — ED Notes (Signed)
Bed: WTR5 Expected date:  Expected time:  Means of arrival:  Comments: 

## 2018-07-06 NOTE — Telephone Encounter (Signed)
ED TOC CM-referral for PCP appointment  Contacted pt and updated with appt time for [facility] on 07/16/2018 at 850 am. Pt states he has used [facility] in the past. Explained importance of follow up with PCP. Isidoro Donning RN CCM Case Mgmt phone [phone]

## 2018-07-06 NOTE — Discharge Instructions (Signed)

## 2018-07-06 NOTE — ED Provider Notes (Signed)
[facility]-EMERGENCY DEPT Provider Note   CSN: [id number] Arrival date & time: 07/06/18  1250    History   Chief Complaint Chief Complaint  Patient presents with  . Back Pain  . glass in foot    left    HPI Randy Barnes is a 58 y.o. male.     58 yo M with a chief complaint of low back pain.  This been a chronic problem for him going on for many years.  He has seen an outpatient orthopedist and there were plans to maybe have a surgery but the patient became homeless and lost his ability to afford medical care.  He feels that his pain is slowly worsening over the past few years.  Pain is worse with ambulation.  Initially it was left-sided many years ago and has been bilateral for some time.  Seems to come and go.  Radiates all the way down to the foot.  He has peripheral neuropathy and is slowly had a sending numbness to his feet that is now up to the midshin bilaterally.  He denies recent trauma denies loss of bowel or bladder denies loss of peritoneal sensation.  He denies fevers.  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  He thinks his pain is gotten worse over the past week or so because he lifted a roommate up off the floor.  The patient stepped on some broken glass about a week ago.  He was able to remove a lot of the glass fragments but thinks that there still may be one in the heel area.  The history is provided by the patient.  Back Pain  Location:  Lumbar spine Quality:  Aching, burning, shooting and stabbing Pain severity:  Moderate Pain is:  Worse during the day Onset quality:  Sudden Duration:  48 months Timing:  Constant Progression:  Worsening Chronicity:  Chronic Relieved by:  Ibuprofen Worsened by:  Palpation, bending and movement Ineffective treatments:  None tried Associated symptoms: no abdominal pain, no chest pain, no fever and no headaches     Past Medical History:  Diagnosis Date  .  Anxiety disorder due to general medical condition with panic attack    h/o hospitalizations for behavioral issues (1980s, 2000s)  . Aorto-iliac atherosclerosis (HCC)   . Arthritis   . Cataract   . COPD (chronic obstructive pulmonary disease) (HCC)   . DDD (degenerative disc disease), lumbar    mild with dextroscoliosis  . Emphysema of lung (HCC)    ?asthmatic bronchitis per prior PCP  . GERD (gastroesophageal reflux disease)   . Hepatitis C   . History of stomach ulcers   . Hyperlipidemia   . Hypertension   . Macular degeneration   . MDD (major depressive disorder), recurrent episode, moderate (HCC)   . Polysubstance (including opioids) dependence, daily use (HCC) 10/2015   narcotics, heroin  . 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 Problems related to release from prison 07/2014  . Psoriasis 01/08/2015  . Type 2 diabetes, uncontrolled, with neuropathy (HCC)   . Urine incontinence     Patient Active Problem List   Diagnosis Date Noted  . Aspiration pneumonia of both lungs (HCC) 08/23/2015  . AKI (acute kidney injury) (HCC) 08/23/2015  . Multiple trauma to chest 08/23/2015  . Health maintenance examination 07/28/2015  . Loss of  weight 07/28/2015  . Skin rash 07/28/2015  . Cough 06/13/2015  . Erectile dysfunction 06/13/2015  . Cataract 06/13/2015  . Aorto-iliac atherosclerosis (HCC)   . Smoker 01/26/2015  . Alcohol use 01/26/2015  . Hemoptysis 01/11/2015  . MDD (major depressive disorder), recurrent episode, moderate (HCC)   . Psoriasis 01/08/2015  . Macular degeneration   . Chronic low back pain 12/26/2014  . Emphysema of lung (HCC)   . Chronic hepatitis C without hepatic coma (HCC)   . Anxiety disorder due to general medical condition with panic attack   . GERD (gastroesophageal reflux disease)   . Hyperlipidemia   . Controlled diabetes mellitus with diabetic neuropathy (HCC) 09/16/2014  . Essential hypertension  09/16/2014  . BPH (benign prostatic hyperplasia) 09/16/2014    Past Surgical History:  Procedure Laterality Date  . STRABISMUS SURGERY     left eye vision loss, multiple surgeries  . TONSILLECTOMY          Home Medications    Prior to Admission medications   Medication Sig Start Date End Date Taking? Authorizing Provider  acetaminophen (TYLENOL) 500 MG tablet Take 1 tablet (500 mg total) by mouth every 6 (six) hours as needed for mild pain, moderate pain, fever or headache. 07/06/18   Melene Plan, DO  albuterol (VENTOLIN HFA) 108 (90 Base) MCG/ACT inhaler Inhale 2 puffs into the lungs every 6 (six) hours as needed for wheezing or shortness of breath. 05/21/18   Sudie Grumbling, NP  amitriptyline (ELAVIL) 100 MG tablet Take 1 tablet (100 mg total) by mouth at bedtime as needed for sleep. 05/21/18   Sudie Grumbling, NP  atenolol (TENORMIN) 25 MG tablet Take 1 tablet (25 mg total) by mouth daily. 05/21/18   Sudie Grumbling, NP  diclofenac (VOLTAREN) 75 MG EC tablet Take 1 tablet (75 mg total) by mouth 2 (two) times daily as needed for moderate pain. 05/21/18   Sudie Grumbling, NP  gabapentin (NEURONTIN) 800 MG tablet Take 1 tablet (800 mg total) by mouth 4 (four) times daily. 05/21/18   Sudie Grumbling, NP  Glucose Blood (BLOOD GLUCOSE TEST STRIPS) STRP Use as directed 04/28/15   Eustaquio Boyden, MD  ibuprofen (ADVIL) 400 MG tablet Take 1 tablet (400 mg total) by mouth every 8 (eight) hours as needed. 07/06/18   Melene Plan, DO  lisinopril (PRINIVIL,ZESTRIL) 10 MG tablet Take 1 tablet (10 mg total) by mouth daily. 05/21/18   Sudie Grumbling, NP  omeprazole (PRILOSEC) 20 MG capsule Take 1 capsule (20 mg total) by mouth 2 (two) times daily before a meal. 05/21/18   Amyot, Ali Lowe, NP  salicylic acid-lactic acid 17 % external solution Apply topically daily. 07/06/18   Melene Plan, DO  tamsulosin (FLOMAX) 0.4 MG CAPS capsule Take 1 capsule by mouth twice a day 05/21/18   Sudie Grumbling, NP    Family  History Family History  Problem Relation Age of Onset  . Diabetes Father   . Heart failure Father   . CAD Father   . Stroke Mother   . Hypertension Mother   . Mental illness Mother   . Depression Mother   . Irritable bowel syndrome Mother   . Bipolar disorder Brother        ?  Marland Kitchen CAD Paternal Grandfather   . Stroke Maternal Grandfather     Social History Social History   Tobacco Use  . Smoking status: Current Every Day Smoker    Packs/day: 1.00  Years: 29.00    Pack years: 29.00    Types: Cigarettes    Start date: 03/18/1973  . Smokeless tobacco: Former NeurosurgeonUser    Types: Snuff  . Tobacco comment: Has tried dip in the past some, quit for 7 year   Substance Use Topics  . Alcohol use: Yes    Alcohol/week: 6.0 standard drinks    Types: 6 Cans of beer per week    Comment: occasional, not regular  . Drug use: No    Types: Marijuana, IV, Heroin, Oxycodone, Hydrocodone    Comment: 09/25/15 last use     Allergies   Penicillins and Guaifenesin & derivatives   Review of Systems Review of Systems  Constitutional: Negative for chills and fever.  HENT: Negative for congestion and facial swelling.   Eyes: Negative for discharge and visual disturbance.  Respiratory: Negative for shortness of breath.   Cardiovascular: Negative for chest pain and palpitations.  Gastrointestinal: Negative for abdominal pain, diarrhea and vomiting.  Musculoskeletal: Positive for back pain. Negative for arthralgias and myalgias.  Skin: Negative for color change and rash.  Neurological: Negative for tremors, syncope and headaches.  Psychiatric/Behavioral: Negative for confusion and dysphoric mood.     Physical Exam Updated Vital Signs BP 109/78 (BP Location: Right Arm)   Pulse 65   Temp 98 F (36.7 C) (Oral)   Resp 18   Ht 6\' 2"  (1.88 m)   Wt 99.8 kg   SpO2 98%   BMI 28.25 kg/m   Physical Exam Vitals signs and nursing note reviewed.  Constitutional:      Appearance: He is  well-developed.  HENT:     Head: Normocephalic and atraumatic.  Eyes:     Pupils: Pupils are equal, round, and reactive to light.  Neck:     Musculoskeletal: Normal range of motion and neck supple.     Vascular: No JVD.  Cardiovascular:     Rate and Rhythm: Normal rate and regular rhythm.     Heart sounds: No murmur. No friction rub. No gallop.   Pulmonary:     Effort: No respiratory distress.     Breath sounds: No wheezing.  Abdominal:     General: There is no distension.     Tenderness: There is no guarding or rebound.  Musculoskeletal: Normal range of motion.     Comments: Patient has an increased buildup of callus and some tenderness to the lateral aspect of the left heel.  There is no noted erythema no fluctuance no warmth.  The patient has no midline spinal tenderness on palpation.  He describes the pain is diffuse along the low back.  The patient is able to stand and walk he is able to plantarflex with bilateral feet.  He has intact pulse and motor though has significant decrease sensation to bilateral lower extremities from about mid shin down.  No noted clonus.   Skin:    Coloration: Skin is not pale.     Findings: No rash.  Neurological:     Mental Status: He is alert and oriented to person, place, and time.  Psychiatric:        Behavior: Behavior normal.      ED Treatments / Results  Labs (all labs ordered are listed, but only abnormal results are displayed) Labs Reviewed - No data to display  EKG None  Radiology No results found.  Procedures Procedures (including critical care time)  Medications Ordered in ED Medications  acetaminophen (TYLENOL) tablet 1,000 mg (has no  administration in time range)  ketorolac (TORADOL) injection 15 mg (has no administration in time range)  oxyCODONE (Oxy IR/ROXICODONE) immediate release tablet 5 mg (has no administration in time range)  diazepam (VALIUM) tablet 5 mg (has no administration in time range)      Initial Impression / Assessment and Plan / ED Course  I have reviewed the triage vital signs and the nursing notes.  Pertinent labs & imaging results that were available during my care of the patient were reviewed by me and considered in my medical decision making (see chart for details).        58 yo M with a chief complaint of chronic back pain and possible foreign body to the left foot.  Patient has no red flags.  Last IV drug abuse was at least 5 years ago per him.  He is afebrile he is well-appearing and nontoxic.  Able to ambulate for me in the room.  He has what appears to be a corn to the left lateral plantar aspect of the foot.  This could be a retained foreign body though I discussed with him that with his significant peripheral neuropathy and a week of having a foreign body I felt that the risks would outweigh the benefits of trying to do an I&D at bedside.  We will have the patient follow-up as an outpatient.  He is having some difficulty during the current coronavirus outbreak.  Will consult case management to try and help.  1:28 PM:  I have discussed the diagnosis/risks/treatment options with the patient and believe the pt to be eligible for discharge home to follow-up with PCP. We also discussed returning to the ED immediately if new or worsening sx occur. We discussed the sx which are most concerning (e.g., sudden worsening pain, fever,cauda equina s/sx) that necessitate immediate return. Medications administered to the patient during their visit and any new prescriptions provided to the patient are listed below.  Medications given during this visit Medications  acetaminophen (TYLENOL) tablet 1,000 mg (has no administration in time range)  ketorolac (TORADOL) injection 15 mg (has no administration in time range)  oxyCODONE (Oxy IR/ROXICODONE) immediate release tablet 5 mg (has no administration in time range)  diazepam (VALIUM) tablet 5 mg (has no administration in time range)      The patient appears reasonably screen and/or stabilized for discharge and I doubt any other medical condition or other [facility] requiring further screening, evaluation, or treatment in the ED at this time prior to discharge.    Final Clinical Impressions(s) / ED Diagnoses   Final diagnoses:  Chronic bilateral low back pain with bilateral sciatica    ED Discharge Orders         Ordered    acetaminophen (TYLENOL) 500 MG tablet  Every 6 hours PRN     07/06/18 1320    ibuprofen (ADVIL) 400 MG tablet  Every 8 hours PRN     07/06/18 1320    salicylic acid-lactic acid 17 % external solution  Daily     07/06/18 1320           Falk[place], DO 07/06/18 1328

## 2018-07-06 NOTE — ED Triage Notes (Signed)
Pt c/o chronic back pains that got worse in Nov/Dec last year. Was seen at an UC and prescribed medications but his Medicaid wouldn't cover it. Thinks he may have glass in left foot from where he stepped on glass last week.

## 2018-07-16 ENCOUNTER — Inpatient Hospital Stay: Payer: Self-pay

## 2018-07-22 ENCOUNTER — Ambulatory Visit (INDEPENDENT_AMBULATORY_CARE_PROVIDER_SITE_OTHER): Payer: Medicaid Other | Admitting: Psychiatry

## 2018-07-22 ENCOUNTER — Other Ambulatory Visit: Payer: Self-pay

## 2018-07-22 ENCOUNTER — Encounter (HOSPITAL_COMMUNITY): Payer: Self-pay | Admitting: Psychiatry

## 2018-07-22 DIAGNOSIS — F319 Bipolar disorder, unspecified: Secondary | ICD-10-CM

## 2018-07-22 DIAGNOSIS — F1210 Cannabis abuse, uncomplicated: Secondary | ICD-10-CM

## 2018-07-22 DIAGNOSIS — Z7289 Other problems related to lifestyle: Secondary | ICD-10-CM | POA: Diagnosis not present

## 2018-07-22 DIAGNOSIS — Z789 Other specified health status: Secondary | ICD-10-CM

## 2018-07-22 DIAGNOSIS — F411 Generalized anxiety disorder: Secondary | ICD-10-CM

## 2018-07-22 MED ORDER — VENLAFAXINE HCL ER 37.5 MG PO CP24: ORAL_CAPSULE | ORAL | 1 refills | Status: DC

## 2018-07-22 MED ORDER — CARIPRAZINE HCL 1.5 MG PO CAPS: 1.5000 mg | ORAL_CAPSULE | Freq: Every day | ORAL | 1 refills | Status: DC
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
## 2018-07-27 NOTE — Progress Notes (Addendum)
Patient ID: Randy Barnes, male   DOB: [birth year], 58 y.o.   MRN: [id number] Virtual Visit via Telephone Note  I connected with Randy Battiestoger D Barnes on 07/28/18 at 10:30 AM EDT by telephone and verified that I am speaking with the correct person using two identifiers.   Consent:  I discussed the limitations, risks, security and privacy concerns of performing an evaluation and management service by telephone and the availability of in person appointments. I also discussed with the patient that there may be a patient responsible charge related to this service. The patient expressed understanding and agreed to proceed.   Location of patient: The patient was on the front porch of a rental house and note the patient is homeless  Location of provider: I was in my office  Persons participating in the televisit with the patient.   No one else was with the patient at the time of the call     History of Present Illness: This is a 58 year old male who was recently seen in the emergency room on April 20 with significant bilateral low back pain.  Note also previous emergency room visit had occurred March 5 but this was an urgent care.    The patient has a longstanding history of bipolar disorder and generalized anxiety disorder.  The patient's had polysubstance use in the past.  Patient has a prior history as well of neuropathy and severe lumbar back pain with radiculopathy.  As well the patient's had previous diagnosis of psoriasis benign prostatic hypertrophy hypertension and COPD.  Patient also has had a history of chronic hepatitis C and [facility]  [place] and has not had any primary care or infectious disease or orthopedic follow-up of his conditions in the past 3 years.  In addition to this the patient is homeless now.  The patient stepped on some fine glass and injured his left foot and  went to the emergency room most recently on April 20.  Exam was unremarkable and no retained foreign body was seen.  Much of the pain appears to be more related to neuropathy.  The patient also complained of severe lower back pain.  [place]  He has difficulty walking with this.  He is seeing [facility] in the past.  Note the patient is not currently using any substances but is using tobacco products.  He has an as needed albuterol inhaler and has been on Advair in the past.  The patient does note shortness of breath with exertion at this time.  Patient currently has no ability to pay a co-pay on his Medicaid.  He has requested assistance for housing but is yet to achieve this.  Note he just had a visit with a behavioral health specialist that was a telemetry visit last week and did get a prescription for his Effexor and Vraylar    Observations/Objective: No observations as this is a telephone visit  Assessment and Plan: #1 COPD with emphysema: Patient continues to smoke and he was advised on this matter.  For now my plan will be to refill his albuterol inhaler to get him into the office for a physical exam  #2 bipolar disorder and generalized anxiety disorder: The patient is under care of psychiatry and will take his regular and Effexor to see what benefit there may  be  #3 severe lower lumbar pain previously evaluated by [facility].  Imaging studies have shown in 2016 significant lumbar disease.  Plan will be to refer back to orthopedics regarding this condition and he will continue the Neurontin at 800 mg 4 times daily  #4 previous history of diabetes but note last hemoglobin A1c in 2018 was 5.7 and he is currently off metformin at this time.  Recent blood glucoses have been normal.  He has lost significant amounts of weight and is following a diabetic diet at this time.  #5 hypertension: Patient currently appears to be  stable on lisinopril and atenolol and we will reassess him on the next office visit  #6 Homelessness Will ask case management to assist  Follow Up Instructions: An in office exam will be scheduled within a week with lab draws the patient is aware of this  The patient is also aware an appointment with orthopedics will be made   I discussed the assessment and treatment plan with the patient. The patient was provided an opportunity to ask questions and all were answered. The patient agreed with the plan and demonstrated an understanding of the instructions.   The patient was advised to call back or seek an in-person evaluation if the symptoms worsen or if the condition fails to improve as anticipated.  I provided 45 minutes of non-face-to-face time during this encounter  including  median intraservice time , review of notes, labs, imaging, medications  and explaining diagnosis and management to the patient .    Shan Levans, MD

## 2018-07-28 ENCOUNTER — Ambulatory Visit: Payer: Medicaid Other | Attending: Critical Care Medicine | Admitting: Critical Care Medicine

## 2018-07-28 ENCOUNTER — Encounter: Payer: Self-pay | Admitting: Critical Care Medicine

## 2018-07-28 ENCOUNTER — Ambulatory Visit (HOSPITAL_COMMUNITY): Payer: No Typology Code available for payment source | Admitting: Licensed Clinical Social Worker

## 2018-07-28 ENCOUNTER — Other Ambulatory Visit: Payer: Self-pay

## 2018-07-28 DIAGNOSIS — G8929 Other chronic pain: Secondary | ICD-10-CM | POA: Diagnosis not present

## 2018-07-28 DIAGNOSIS — F17200 Nicotine dependence, unspecified, uncomplicated: Secondary | ICD-10-CM

## 2018-07-28 DIAGNOSIS — I10 Essential (primary) hypertension: Secondary | ICD-10-CM

## 2018-07-28 DIAGNOSIS — E1140 Type 2 diabetes mellitus with diabetic neuropathy, unspecified: Secondary | ICD-10-CM | POA: Diagnosis not present

## 2018-07-28 DIAGNOSIS — E7800 Pure hypercholesterolemia, unspecified: Secondary | ICD-10-CM

## 2018-07-28 DIAGNOSIS — M5442 Lumbago with sciatica, left side: Secondary | ICD-10-CM | POA: Diagnosis not present

## 2018-07-28 DIAGNOSIS — B182 Chronic viral hepatitis C: Secondary | ICD-10-CM

## 2018-07-28 DIAGNOSIS — J432 Centrilobular emphysema: Secondary | ICD-10-CM

## 2018-07-28 DIAGNOSIS — M5441 Lumbago with sciatica, right side: Secondary | ICD-10-CM

## 2018-07-28 DIAGNOSIS — Z5900 Homelessness unspecified: Secondary | ICD-10-CM | POA: Insufficient documentation

## 2018-07-29 ENCOUNTER — Ambulatory Visit (INDEPENDENT_AMBULATORY_CARE_PROVIDER_SITE_OTHER): Payer: Medicaid Other | Admitting: Orthopaedic Surgery

## 2018-07-29 ENCOUNTER — Telehealth: Payer: Self-pay

## 2018-07-29 ENCOUNTER — Ambulatory Visit: Payer: Self-pay

## 2018-07-29 ENCOUNTER — Encounter: Payer: Self-pay | Admitting: Orthopaedic Surgery

## 2018-07-29 VITALS — Ht 74.0 in | Wt 222.2 lb

## 2018-07-29 DIAGNOSIS — M4316 Spondylolisthesis, lumbar region: Secondary | ICD-10-CM | POA: Diagnosis not present

## 2018-07-29 DIAGNOSIS — M542 Cervicalgia: Secondary | ICD-10-CM | POA: Diagnosis not present

## 2018-07-29 DIAGNOSIS — I739 Peripheral vascular disease, unspecified: Secondary | ICD-10-CM

## 2018-07-29 DIAGNOSIS — M545 Low back pain: Secondary | ICD-10-CM

## 2018-07-29 DIAGNOSIS — G8929 Other chronic pain: Secondary | ICD-10-CM

## 2018-07-29 NOTE — Telephone Encounter (Signed)
Call received from Darlina Sicilian, [facility]. Referral made, she said that she would contact the patient tomorrow - 07/30/2018.

## 2018-07-29 NOTE — Progress Notes (Signed)
Office Visit Note   Patient: Randy Barnes           Date of Birth: 07-16-1960           MRN: [id number] Visit Date: 07/29/2018              Requested by: Storm Frisk, MD [address] [place], [place] [address] PCP: System, Provider Not In   Assessment & Plan: Visit Diagnoses:  1. Neck pain   2. Chronic bilateral low back pain, unspecified whether sciatica present   3. Spondylolisthesis of lumbar region   4. Claudication [facility])     Plan: With patient's worsening low back pain, lower extremity radiculopathy and grade 1 L4-5 spondylolisthesis I recommend getting a lumbar MRI to rule out HNP/stenosis.  Follow-up with Dr. Ophelia Charter after completion to discuss results and further treatment options.  No narcotic medication given today.  We will continue to watch his neck symptoms.  May consider further imaging with MRI in the future there as well.  Patient also has symptoms of claudication.  I cannot say for certain as to whether or not it is arterial versus neurogenic.  We will also order arterial Dopplers.  Follow-Up Instructions: Return in about 3 weeks (around 08/19/2018) for review lumbar mri and recheck neck and back.   Orders:  Orders Placed This Encounter  Procedures  . XR Cervical Spine 2 or 3 views  . XR Lumbar Spine 2-3 Views  . XR Lumb Spine Flex&Ext Only  . MR Lumbar Spine w/o contrast   No orders of the defined types were placed in this encounter.     Procedures: No procedures performed   Clinical Data: No additional findings.   Subjective: Chief Complaint  Patient presents with  . Neck - Pain  . Lower Back - Pain    HPI 58 year old white male history of low back pain with worsening bilateral lower extremity radiculopathy, unsteady gait, neurogenic claudication and neck pain with bilateral hand numbness and tingling comes in for evaluation.  Patient last seen in the office by Dr. Ophelia Charter July 04, 2015 for chronic problems with his low back.  States that  currently his low back is worse than his neck.  Chronic low back pain with radiation down both legs to the ankles/feet.  States that he ambulates with a cane slightly leaning forward.  Unsteady gait worsened over the last couple of years.  He has had multiple falls.  Bilateral leg cramping the further he tries to walk.  States that he has had neck pain for "a long time".  He has intermittent numbness and tingling in both hands.  Also has some pain in both arms.  Patient currently taking gabapentin.  Reports living a "hard life" when he was young.  He was recently in prison for 32 years and released a few years ago.  Told clinical assistant today "feel like I am about to relapse and do not want to do that".  Lumbar MRI from February 07, 2015 showed:   EXAM: MRI LUMBAR SPINE WITHOUT CONTRAST  TECHNIQUE: Multiplanar, multisequence MR imaging of the lumbar spine was performed. No intravenous contrast was administered.  COMPARISON:  Radiography 01/26/2015  FINDINGS: Transitional lumbosacral anatomy. The lowest open disc space is numbered S1-S2 based on the number of non rib-bearing vertebrae on previous radiography and 12 ribs on 01/09/2015 chest x-ray.  No marrow signal abnormality suggestive of fracture, infection, or neoplasm. Normal conus signal and morphology. No perispinal abnormality to explain  back pain.  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 Probable early degenerative facet arthropathy. AP narrowing of the thecal sac, predominately congenital, without compression.  L4-L5: Facet arthropathy with moderate overgrowth. Mild annulus bulging. Congenital and degenerative factors cause triangular deformation of the thecal sac which is overall mild.  Subarticular recesses are effaced but the descending L5 nerve roots are medial to the stenosis. Mild left foraminal narrowing but no complete effacement of perineural fat.  L5-S1:Mild annulus bulging and endplate spurring. Mild facet arthropathy. Ventral thecal sac flattening without impingement.  S1-S2:  Spondylotic spurring.  No herniation or stenosis.  IMPRESSION: 1. Transitional lumbosacral anatomy with open S1-2 disc space, as described above. If intervention is planned recommend careful level determination. 2. 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 No high-grade stenosis or impingement. 3. Focally advanced L4-5 facet arthropathy with bulky spurring  Patient also been followed by behavioral health and states that he was recently started on a new medication from them.        Review of systems no current cardiac pulmonary GI GU issues  Objective: Vital Signs: Ht 6\' 2"  (1.88 m)   Wt 222 lb 3.2 oz (100.8 kg)   BMI 28.53 kg/m   Physical Exam HENT:     Head: Normocephalic.  Eyes:     Extraocular Movements: Extraocular movements intact.     Pupils: Pupils are equal, round, and reactive to light.  Neck:     Musculoskeletal: Normal range of motion.  Pulmonary:     Effort: No respiratory distress.  Musculoskeletal:     Comments: Patient ambulates with a cane slightly leaning over at the waist.  Negative logroll bilateral hips.  He does have some discomfort with bilateral straight leg raise.  Trace bilateral quad anterior tib and gastroc weakness.  Bilateral calves nontender  Neurological:     Mental Status: He is alert and oriented to person, place, and time.  Psychiatric:     Comments: Flight of ideas. (very talkative)     Ortho Exam  Specialty Comments:  No specialty comments available.  Imaging: No results found.   PMFS History: Patient Active Problem List   Diagnosis Date Noted  .  Homelessness 07/28/2018  . Age-related nuclear cataract of right eye 03/12/2016  . Multiple trauma to chest 08/23/2015  . Skin rash 07/28/2015  . Erectile dysfunction 06/13/2015  . Cataract 06/13/2015  . Aorto-iliac atherosclerosis (HCC)   . Smoker 01/26/2015  . Alcohol use 01/26/2015  . MDD (major depressive disorder), recurrent episode, moderate (HCC)   . Psoriasis 01/08/2015  . Macular degeneration   . Chronic low back pain 12/26/2014  . Emphysema of lung (HCC)   . Chronic hepatitis C without hepatic coma (HCC)   . Anxiety disorder due to general medical condition with panic attack   . GERD (gastroesophageal reflux disease)   . Hyperlipidemia   . Controlled diabetes mellitus with diabetic neuropathy (HCC) 09/16/2014  . Essential hypertension 09/16/2014  . BPH (benign prostatic hyperplasia) 09/16/2014   Past Medical History:  Diagnosis Date  . Anxiety disorder due to general medical condition with panic attack    h/o hospitalizations for behavioral issues (1980s, 2000s)  . Aorto-iliac atherosclerosis (HCC)   . Arthritis   . Cataract   . COPD (chronic  obstructive pulmonary disease) (HCC)   . DDD (degenerative disc disease), lumbar    mild with dextroscoliosis  . Depression   . Emphysema of lung (HCC)    ?asthmatic bronchitis per prior PCP  . GERD (gastroesophageal reflux disease)   . Hepatitis C   . History of stomach ulcers   . Hyperlipidemia   . Hypertension   . Macular degeneration   . MDD (major depressive disorder), recurrent episode, moderate (HCC)   . Polysubstance (including opioids) dependence, daily use (HCC) 10/2015   narcotics, heroin  . 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 Problems related to release from prison 07/2014  . Psoriasis 01/08/2015  . Type 2 diabetes, uncontrolled, with neuropathy (HCC)   . Urine incontinence     Family History  Problem Relation Age of Onset  . Diabetes Father   . Heart  failure Father   . CAD Father   . Stroke Mother   . Hypertension Mother   . Mental illness Mother   . Depression Mother   . Irritable bowel syndrome Mother   . Bipolar disorder Brother        ?  Marland Kitchen CAD Paternal Grandfather   . Stroke Maternal Grandfather     Past Surgical History:  Procedure Laterality Date  . STRABISMUS SURGERY     left eye vision loss, multiple surgeries  . TONSILLECTOMY     Social History   Occupational History  . Not on file  Tobacco Use  . Smoking status: Current Every Day Smoker    Packs/day: 1.00    Years: 29.00    Pack years: 29.00    Types: Cigarettes    Start date: 03/18/1973  . Smokeless tobacco: Former Neurosurgeon    Types: Snuff  . Tobacco comment: Has tried dip in the past some, quit for 7 year   Substance and Sexual Activity  . Alcohol use: Yes    Alcohol/week: 6.0 standard drinks    Types: 6 Cans of beer per week    Comment: occasional, not regular  . Drug use: No    Types: Marijuana, IV, Heroin, Oxycodone, Hydrocodone    Comment: 09/25/15 last use  . Sexual activity: Never

## 2018-07-29 NOTE — Telephone Encounter (Signed)
Call placed to patient to discuss housing needs. He explained that he was living a [facility] and recently relocated to [place] because he was not able to access medical care where he was living.  He explained that his mother is still living at [facility] and she has a Paramedic in [place].  He does not have a key to her property and plans to stay on the porch.  He said that there is not anyone in the area that can let him in the house.   He has been to [facility] and has contacted [facility] and has been informed that there are no shelter beds available. He receives SSI and receives $780/month.  He does not want to spend money on a motel room and he does not have anyone else to stay with.  He also noted that he has a brother and sister that work for [employer].    He was agreeable to this CM making a referral to [facility]. Informed him of the importance of returning the call if the representative leaves him a message.  He confirmed his in person appointment with Dr Delford Field on 08/03/2018 @ 1120.  He also stated that he receives behavioral health services through [facility].   Message left for [facility] # [phone] requesting call back to this CM.

## 2018-08-02 NOTE — Progress Notes (Signed)
Subjective:    Patient ID: Randy Barnes, male    DOB: January 03, 1961, 58 y.o.   MRN: [id number] Virtual Visit via Telephone Note  I connected with Harle Battiest on 08/03/18 at 11:20 AM EDT by telephone and verified that I am speaking with the correct person using two identifiers.   Consent:  I discussed the limitations, risks, security and privacy concerns of performing an evaluation and management service by telephone and the availability of in person appointments. I also discussed with the patient that there may be a patient responsible charge related to this service. The patient expressed understanding and agreed to proceed.  Location of patient:  In a bus  Location of provider: in my office  Persons participating in the televisit with the patient.   No one else on phone   History of Present Illness: This is a 58 year old male who was recently seen in the emergency room on April 20 with significant bilateral low back pain.  Note also previous emergency room visit had occurred March 5 but this was an urgent care.    The patient has a longstanding history of bipolar disorder and generalized anxiety disorder.  The patient's had polysubstance use in the past.  Patient has a prior history as well of neuropathy and severe lumbar back pain with radiculopathy.  As well the patient's had previous diagnosis of psoriasis benign prostatic hypertrophy hypertension and COPD.  Patient also has had a history of chronic hepatitis C and [facility].  [place] and has not had any primary care or infectious disease or orthopedic follow-up of his conditions in the past 3 years.  In addition to this the patient is homeless now.  The patient stepped on some fine glass and injured his left foot and went to the emergency room most recently on April 20.  Exam was unremarkable and no retained foreign  body was seen.  Much of the pain appears to be more related to neuropathy.  The patient also complained of severe lower back pain.  [place]  He has difficulty walking with this.  He is seeing [place] orthopedics in the past.  Note the patient is not currently using any substances but is using tobacco products.  He has an as needed albuterol inhaler and has been on Advair in the past.  The patient does note shortness of breath with exertion at this time.  Patient currently has no ability to pay a co-pay on his Medicaid.  He has requested assistance for housing but is yet to achieve this.  Note he just had a visit with a behavioral health specialist that was a telemetry visit last week and did get a prescription for his Effexor and Vraylar  This visit was to be a f/u visit in office but delayed as the pt is going to public health dept for COVID testing. He is relocating to a different housing and needs the testing and did have more dyspnea.    He has no increase in cough.  There are no other COVID symptoms He saw ortho last week and MRI of lumbar spine and vas u/s of art/veins LE pending.    Observations/Objective: No observation  Assessment and Plan: #1 COPD with emphysema: Patient continues to smoke and he was advised on this matter.  Plan  to Rx Symbicort and albuterol to his local pharmacy and get him back in for OV   #2 bipolar disorder and generalized anxiety disorder: The patient is under care of psychiatry and will take his regular and Effexor to see what benefit there may be  #3 severe lower lumbar pain previously evaluated by [facility].  Fu with Ortho after new imaging is performed   #4 previous history of diabetes but note last hemoglobin A1c in 2018 was 5.7 and he is currently off metformin at this time.  Recent blood glucoses have been normal.  He has lost significant amounts of weight and is following a diabetic  diet at this time.  #5 hypertension: Patient currently appears to be stable on lisinopril and atenolol and we will reassess him on the next office visit  #6 Homelessness Now getting assistance into housing.    Follow Up Instructions: Pt knows we will see him in a f/u in office visit in one week   I discussed the assessment and treatment plan with the patient. The patient was provided an opportunity to ask questions and all were answered. The patient agreed with the plan and demonstrated an understanding of the instructions.   The patient was advised to call back or seek an in-person evaluation if the symptoms worsen or if the condition fails to improve as anticipated.  I provided 15 minutes of non-face-to-face time during this encounter  including  median intraservice time , review of notes, labs, imaging, medications  and explaining diagnosis and management to the patient .    Shan LevansPatrick Khian Remo, MD

## 2018-08-03 ENCOUNTER — Telehealth: Payer: Self-pay | Admitting: *Deleted

## 2018-08-03 ENCOUNTER — Ambulatory Visit: Payer: Medicaid Other | Attending: Critical Care Medicine | Admitting: Critical Care Medicine

## 2018-08-03 ENCOUNTER — Encounter: Payer: Self-pay | Admitting: Critical Care Medicine

## 2018-08-03 ENCOUNTER — Other Ambulatory Visit: Payer: Self-pay

## 2018-08-03 ENCOUNTER — Telehealth: Payer: Self-pay

## 2018-08-03 DIAGNOSIS — I10 Essential (primary) hypertension: Secondary | ICD-10-CM

## 2018-08-03 DIAGNOSIS — J432 Centrilobular emphysema: Secondary | ICD-10-CM | POA: Diagnosis not present

## 2018-08-03 DIAGNOSIS — Z5900 Homelessness unspecified: Secondary | ICD-10-CM

## 2018-08-03 DIAGNOSIS — E1140 Type 2 diabetes mellitus with diabetic neuropathy, unspecified: Secondary | ICD-10-CM

## 2018-08-03 DIAGNOSIS — M5442 Lumbago with sciatica, left side: Secondary | ICD-10-CM

## 2018-08-03 DIAGNOSIS — G8929 Other chronic pain: Secondary | ICD-10-CM

## 2018-08-03 DIAGNOSIS — F17200 Nicotine dependence, unspecified, uncomplicated: Secondary | ICD-10-CM | POA: Diagnosis not present

## 2018-08-03 DIAGNOSIS — M5441 Lumbago with sciatica, right side: Secondary | ICD-10-CM

## 2018-08-03 DIAGNOSIS — J449 Chronic obstructive pulmonary disease, unspecified: Secondary | ICD-10-CM | POA: Diagnosis not present

## 2018-08-03 MED ORDER — BUDESONIDE-FORMOTEROL FUMARATE 160-4.5 MCG/ACT IN AERO: 2.0000 | INHALATION_SPRAY | Freq: Two times a day (BID) | RESPIRATORY_TRACT | 12 refills | Status: DC

## 2018-08-03 MED ORDER — ALBUTEROL SULFATE HFA 108 (90 BASE) MCG/ACT IN AERS: 2.0000 | INHALATION_SPRAY | Freq: Four times a day (QID) | RESPIRATORY_TRACT | 2 refills | Status: DC | PRN

## 2018-08-03 NOTE — Telephone Encounter (Addendum)
Message received from [employer] [facility]. She reported that she completed the homeless risk assessment for the patient last week. She contacted the [facility] Department and they took him to the [facility] to quarantine while  COVID testing is done. As per Eunice Blase, the health department is COVID testing all homeless individuals and quarantining them in the local motels until the test results come back. If negative, the individuals will be transferred to a local shelter and if positive, they will remain quarantined at the motel.   Debbie checked on him multiple times during the weekend.  He was quite anxious when he first arrived and did not have a phone. He was able to calm down after the phone issue was resolved. He is scheduled for COVID testing at the health department today at 1300.  If the test is negative, he has been approved to be transferred to [facility] with plan to obtain more permanent housing.   Debbie reported that this morning, he was complaining of shortness of breath and wanted to be seen by a doctor.  She called 911, the patient was assessed and it was determined that he was not in distress and did not need to go to the ED.  This CM spoke to the patient and informed him that Dr Delford Field would do a televisit with him today at 1120.  He should not come to the clinic. He was in agreement and said he was feeling better. He said that he just couldn't breath last night when he laid down. He also said that he stopped taking the effexor but could not explain why he stopped. Explained to him that Dr Delford Field would review his medications with him when he calls.

## 2018-08-05 ENCOUNTER — Telehealth: Payer: Self-pay

## 2018-08-05 NOTE — Telephone Encounter (Signed)
Call received from [facility].  She stated that the patient had COVID testing done on 08/03/2018.  She said that [facility] will accept him after COVID testing is completed but she will also speak to his [facility] case worker about possibly having him shelter in a motel versus going to [facility] as [employer] [employer] may be a better option for his mental stability.

## 2018-08-06 ENCOUNTER — Telehealth: Payer: Self-pay

## 2018-08-06 NOTE — Telephone Encounter (Signed)
Call received from [facility] who stated that the patient was moved into a boarding house today. She was not sure where but said that he received assistance from [facility] with this placement.

## 2018-08-11 ENCOUNTER — Telehealth: Payer: Self-pay

## 2018-08-11 NOTE — Telephone Encounter (Signed)
Call received from [facility]. She explained that she has been in touch with the patient since he left the motel for COVID testing, she explained that he was going to a boarding house and then noted that he ' smokes pot sometimes."  He did not go to the boarding house, went to [facility] instead but has left there and went to a motel room and then to his mother's house.

## 2018-08-14 ENCOUNTER — Other Ambulatory Visit: Payer: Self-pay | Admitting: Nurse Practitioner

## 2018-08-14 ENCOUNTER — Other Ambulatory Visit (HOSPITAL_COMMUNITY): Payer: Self-pay | Admitting: Psychiatry

## 2018-08-14 ENCOUNTER — Ambulatory Visit: Payer: Medicaid Other | Attending: Nurse Practitioner | Admitting: Nurse Practitioner

## 2018-08-14 ENCOUNTER — Other Ambulatory Visit: Payer: Self-pay

## 2018-08-14 ENCOUNTER — Telehealth: Payer: Self-pay

## 2018-08-14 DIAGNOSIS — I10 Essential (primary) hypertension: Secondary | ICD-10-CM

## 2018-08-14 DIAGNOSIS — F1210 Cannabis abuse, uncomplicated: Secondary | ICD-10-CM

## 2018-08-14 DIAGNOSIS — Z7289 Other problems related to lifestyle: Secondary | ICD-10-CM

## 2018-08-14 DIAGNOSIS — F319 Bipolar disorder, unspecified: Secondary | ICD-10-CM

## 2018-08-14 DIAGNOSIS — Z789 Other specified health status: Secondary | ICD-10-CM

## 2018-08-14 DIAGNOSIS — Z1159 Encounter for screening for other viral diseases: Secondary | ICD-10-CM

## 2018-08-14 DIAGNOSIS — R7309 Other abnormal glucose: Secondary | ICD-10-CM

## 2018-08-14 NOTE — Telephone Encounter (Signed)
At request of Ms Meredeth Ide, NP, call placed to [facility] Department # [phone]  to confirm COVID test results. This CM had been informed that the patient was moved from a motel to a boarding house on 08/06/2018.  Spoke to [place] who explained if the patient was released from the motel his test was negative. She will verify with his records and call this CM back # [phone]

## 2018-08-18 ENCOUNTER — Telehealth: Payer: Self-pay

## 2018-08-18 ENCOUNTER — Encounter: Payer: Self-pay | Admitting: Primary Care

## 2018-08-18 ENCOUNTER — Other Ambulatory Visit: Payer: Self-pay

## 2018-08-18 ENCOUNTER — Ambulatory Visit: Payer: Medicaid Other | Attending: Primary Care | Admitting: Primary Care

## 2018-08-18 DIAGNOSIS — M545 Low back pain, unspecified: Secondary | ICD-10-CM

## 2018-08-18 DIAGNOSIS — F410 Panic disorder [episodic paroxysmal anxiety] without agoraphobia: Secondary | ICD-10-CM | POA: Diagnosis not present

## 2018-08-18 DIAGNOSIS — Z760 Encounter for issue of repeat prescription: Secondary | ICD-10-CM

## 2018-08-18 DIAGNOSIS — I10 Essential (primary) hypertension: Secondary | ICD-10-CM

## 2018-08-18 DIAGNOSIS — F064 Anxiety disorder due to known physiological condition: Secondary | ICD-10-CM | POA: Diagnosis not present

## 2018-08-18 DIAGNOSIS — B182 Chronic viral hepatitis C: Secondary | ICD-10-CM

## 2018-08-18 DIAGNOSIS — G8929 Other chronic pain: Secondary | ICD-10-CM

## 2018-08-18 NOTE — Progress Notes (Signed)
Patient verified DOB Patient has taken medication today. Patient has had diet soda. Patient complains of pain in the lower back and is following up with Ortho and has imaging. [facility]

## 2018-08-18 NOTE — Telephone Encounter (Signed)
While patient was on phone with Gwinda Passe, NP, provided him with the phone #s for [facility]  and [employer]

## 2018-08-18 NOTE — Progress Notes (Signed)
Virtual Visit via Telephone Note  I connected with Randy Barnes on 08/18/18 at  2:10 PM EDT by telephone and verified that I am speaking with the correct person using two identifiers.   I discussed the limitations, risks, security and privacy concerns of performing an evaluation and management service by telephone and the availability of in person appointments. I also discussed with the patient that there may be a patient responsible charge related to this service. The patient expressed understanding and agreed to proceed.   History of Present Illness: Randy Barnes is having tele visit for medication refills voices no other c/o.   Observations/Objective: Review of Systems  Musculoskeletal: Positive for back pain.  All other systems reviewed and are negative.   Assessment and Plan: [name] was seen today for medication refill.  Diagnoses and all orders for this visit:  Chronic hepatitis C without hepatic coma (HCC) Will refer to Dr.Van Dam for evaluation of Hep C and tx  Anxiety disorder due to general medical condition with panic attack He is followed by Dr Celesta Gentile defer to him to management   Chronic low back pain without sciatica, unspecified back pain laterality followed by orth  Essential hypertension Unable to ck Bp will order a monitor to assist in management   Medication refill Only medication prescribed per PCP    Follow Up Instructions:    I discussed the assessment and treatment plan with the patient. The patient was provided an opportunity to ask questions and all were answered. The patient agreed with the plan and demonstrated an understanding of the instructions.   The patient was advised to call back or seek an in-person evaluation if the symptoms worsen or if the condition fails to improve as anticipated.  I provided 15 minutes of non-face-to-face time during this encounter.   Grayce Sessions, NP

## 2018-08-20 ENCOUNTER — Other Ambulatory Visit: Payer: Self-pay

## 2018-08-20 ENCOUNTER — Ambulatory Visit (HOSPITAL_COMMUNITY)
Admission: RE | Admit: 2018-08-20 | Discharge: 2018-08-20 | Disposition: A | Discharge status: Home / Self Care | Payer: Medicaid Other | Source: Ambulatory Visit | Attending: Cardiology | Admitting: Cardiology

## 2018-08-20 ENCOUNTER — Other Ambulatory Visit: Payer: Self-pay | Admitting: Surgery

## 2018-08-20 DIAGNOSIS — R202 Paresthesia of skin: Secondary | ICD-10-CM | POA: Insufficient documentation

## 2018-08-20 DIAGNOSIS — M4316 Spondylolisthesis, lumbar region: Secondary | ICD-10-CM | POA: Insufficient documentation

## 2018-08-20 DIAGNOSIS — M545 Low back pain, unspecified: Secondary | ICD-10-CM

## 2018-08-20 DIAGNOSIS — R200 Anesthesia of skin: Secondary | ICD-10-CM | POA: Insufficient documentation

## 2018-08-20 DIAGNOSIS — G8929 Other chronic pain: Secondary | ICD-10-CM | POA: Insufficient documentation

## 2018-08-21 MED FILL — PROAIR HFA 90 MCG INHALER: 108 (90 BAS | 30 days supply | Qty: 9 | Fill #0

## 2018-08-21 MED FILL — SYMBICORT 160-4.5 MCG INH: 160-4.5 | 30 days supply | Qty: 10 | Fill #0

## 2018-08-22 ENCOUNTER — Ambulatory Visit
Admission: RE | Admit: 2018-08-22 | Discharge: 2018-08-22 | Disposition: A | Discharge status: Home / Self Care | Payer: Medicaid Other | Source: Ambulatory Visit | Attending: Surgery | Admitting: Surgery

## 2018-08-22 ENCOUNTER — Other Ambulatory Visit: Payer: Self-pay

## 2018-08-22 DIAGNOSIS — M4316 Spondylolisthesis, lumbar region: Secondary | ICD-10-CM

## 2018-08-26 ENCOUNTER — Encounter (HOSPITAL_COMMUNITY): Payer: Self-pay | Admitting: Family Medicine

## 2018-08-26 ENCOUNTER — Other Ambulatory Visit: Payer: Self-pay

## 2018-08-26 ENCOUNTER — Ambulatory Visit (HOSPITAL_COMMUNITY)
Admission: EM | Admit: 2018-08-26 | Discharge: 2018-08-26 | Disposition: A | Discharge status: Home / Self Care | Payer: Medicaid Other | Attending: Family Medicine | Admitting: Family Medicine

## 2018-08-26 ENCOUNTER — Encounter: Payer: Self-pay | Admitting: Orthopaedic Surgery

## 2018-08-26 ENCOUNTER — Ambulatory Visit (INDEPENDENT_AMBULATORY_CARE_PROVIDER_SITE_OTHER): Payer: Medicaid Other | Admitting: Orthopaedic Surgery

## 2018-08-26 VITALS — Ht 74.0 in | Wt 222.0 lb

## 2018-08-26 DIAGNOSIS — Z760 Encounter for issue of repeat prescription: Secondary | ICD-10-CM | POA: Diagnosis not present

## 2018-08-26 DIAGNOSIS — M48062 Spinal stenosis, lumbar region with neurogenic claudication: Secondary | ICD-10-CM

## 2018-08-26 DIAGNOSIS — M48061 Spinal stenosis, lumbar region without neurogenic claudication: Secondary | ICD-10-CM | POA: Diagnosis not present

## 2018-08-26 DIAGNOSIS — I10 Essential (primary) hypertension: Secondary | ICD-10-CM

## 2018-08-26 DIAGNOSIS — G8929 Other chronic pain: Secondary | ICD-10-CM

## 2018-08-26 DIAGNOSIS — M545 Low back pain: Secondary | ICD-10-CM

## 2018-08-26 MED ORDER — ALBUTEROL SULFATE HFA 108 (90 BASE) MCG/ACT IN AERS: 2.0000 | INHALATION_SPRAY | Freq: Four times a day (QID) | RESPIRATORY_TRACT | 2 refills | Status: DC | PRN

## 2018-08-26 MED ORDER — ATENOLOL 25 MG PO TABS: 25.0000 mg | ORAL_TABLET | Freq: Every day | ORAL | 2 refills | Status: DC

## 2018-08-26 MED ORDER — BUDESONIDE-FORMOTEROL FUMARATE 160-4.5 MCG/ACT IN AERO: 2.0000 | INHALATION_SPRAY | Freq: Two times a day (BID) | RESPIRATORY_TRACT | 12 refills | Status: DC

## 2018-08-26 MED ORDER — TAMSULOSIN HCL 0.4 MG PO CAPS: ORAL_CAPSULE | ORAL | 2 refills | Status: DC

## 2018-08-26 MED ORDER — KETOROLAC TROMETHAMINE 60 MG/2ML IM SOLN
60.0000 mg | Freq: Once | INTRAMUSCULAR | Status: AC
  Administered 2018-08-26: 60 mg via INTRAMUSCULAR

## 2018-08-26 MED ORDER — MELOXICAM 15 MG PO TABS: 15.0000 mg | ORAL_TABLET | Freq: Every day | ORAL | 1 refills | Status: DC

## 2018-08-26 MED ORDER — KETOROLAC TROMETHAMINE 60 MG/2ML IM SOLN
INTRAMUSCULAR | Status: AC
  Filled 2018-08-26: qty 2

## 2018-08-26 MED ORDER — LISINOPRIL 10 MG PO TABS: 10.0000 mg | ORAL_TABLET | Freq: Every day | ORAL | 2 refills | Status: DC

## 2018-08-26 MED ORDER — GABAPENTIN 800 MG PO TABS: 800.0000 mg | ORAL_TABLET | Freq: Four times a day (QID) | ORAL | 2 refills | Status: DC

## 2018-08-26 MED ORDER — ACETAMINOPHEN 500 MG PO TABS: 500.0000 mg | ORAL_TABLET | Freq: Four times a day (QID) | ORAL | 0 refills | Status: DC | PRN

## 2018-08-26 MED ORDER — OMEPRAZOLE 20 MG PO CPDR: 20.0000 mg | DELAYED_RELEASE_CAPSULE | Freq: Two times a day (BID) | ORAL | 2 refills | Status: DC

## 2018-08-26 MED ORDER — AMITRIPTYLINE HCL 100 MG PO TABS: 100.0000 mg | ORAL_TABLET | Freq: Every evening | ORAL | 1 refills | Status: DC | PRN

## 2018-08-26 NOTE — Discharge Instructions (Signed)
I have refilled your medications Meloxicam for pain, this is covered by [facility] Primary care contact put on your discharge instructions.  Follow up as needed for continued or worsening symptoms

## 2018-08-26 NOTE — Progress Notes (Signed)
Office Visit Note   Patient: Randy Barnes           Date of Birth: 1960/11/26           MRN: [id number] Visit Date: 08/26/2018              Requested by: No referring provider defined for this encounter. PCP: Gildardo Pounds, NP   Assessment & Plan: Visit Diagnoses:  1. Spinal stenosis of lumbar region, unspecified whether neurogenic claudication present   2. Spinal stenosis of lumbar region with neurogenic claudication     Plan: We reviewed the MRI scan again with a copy of the report.  He has severe stenosis at L4-5 and has instability shifting up to 7 mm on flexion-extension views.  We will try a single epidural injection with Dr.Newton to see if he can get some temporary relief.  We reviewed with him with instability and required and congenital stenosis when he gets severe and asymptomatic that operative intervention is usually required.  I will recheck him again in 6 weeks.  Follow-Up Instructions: Return in about 6 weeks (around 10/07/2018).   Orders:  Orders Placed This Encounter  Procedures  . Ambulatory referral to Physical Medicine Rehab   No orders of the defined types were placed in this encounter.     Procedures: No procedures performed   Clinical Data: No additional findings.   Subjective: Chief Complaint  Patient presents with  . Lower Back - Pain, Follow-up    MRI Lumbar Review Bilat LE ABI review    HPI 58 year old male with grade 1 L4-5 spondylolisthesis with persistent back pain returns post MRI scan.  States the pain is getting worse.  Patient has congenital short pedicles and MRI scan shows severe stenosis at the L4- 5 level.  Patient has pain with prolonged standing pain with ambulating.  He gets relief with sitting.  Patient had lower extremity ABIs which were normal both right and left leg.  Patient states with walking with his leg started getting weak he has to sit down to avoid falling.  Pain radiates down both legs to his ankles and feet.   He has been amatory with a cane does better if he is leaning forward.  Does better if he has a grocery cart.  Patient's been taking gabapentin.  Patient had been in prison and has been out for a few years.  Patient's MRI scan shows combination of congenital stenosis and acquired stenosis with anterolisthesis resulting in severe central stenosis.  Review of Systems positive hepatitis C, chronic.  Anxiety disorder.  Chronic low back pain with spinal stenosis.,  Hypertension.   Objective: Vital Signs: Ht 6\' 2"  (1.88 m)   Wt 222 lb (100.7 kg)   BMI 28.50 kg/m   Physical Exam Constitutional:      Appearance: He is well-developed.  HENT:     Head: Normocephalic and atraumatic.  Eyes:     Pupils: Pupils are equal, round, and reactive to light.  Neck:     Thyroid: No thyromegaly.     Trachea: No tracheal deviation.  Cardiovascular:     Rate and Rhythm: Normal rate.  Pulmonary:     Effort: Pulmonary effort is normal.     Breath sounds: No wheezing.  Abdominal:     General: Bowel sounds are normal.     Palpations: Abdomen is soft.  Skin:    General: Skin is warm and dry.     Capillary Refill: Capillary refill takes less  than 2 seconds.  Neurological:     Mental Status: He is alert and oriented to person, place, and time.  Psychiatric:        Behavior: Behavior normal.        Thought Content: Thought content normal.        Judgment: Judgment normal.     Ortho Exam  Specialty Comments:  No specialty comments available.  Imaging: CLINICAL DATA:  Low back pain radiating to the hips. Symptoms for years.  EXAM: MRI LUMBAR SPINE WITHOUT CONTRAST  TECHNIQUE: Multiplanar, multisequence MR imaging of the lumbar spine was performed. No intravenous contrast was administered.  COMPARISON:  Plain films 07/29/2018.  MRI lumbar spine 02/07/2015.  FINDINGS: Segmentation: Transitional anatomy. 12 rib-bearing thoracic vertebrae, in 6 non-rib-bearing vertebrae. S1-S2 is therefore  the lowest open disc space. Numbering scheme was used previously is continued.  Alignment:  3 mm anterolisthesis L4-5. otherwise anatomic.  Vertebrae:  No worrisome osseous lesion.  Conus medullaris and cauda equina: Conus extends to the L1-L2 level. Conus and cauda equina appear normal.  Paraspinal and other soft tissues: Unremarkable.  Disc levels:  L1-L2: Chronic disc space narrowing. Annular bulge. No impingement.  L2-L3:  Normal interspace.  L3-L4: Loss of disc height and signal due to Schmorl's node. Annular bulge. Short pedicles. Posterior element hypertrophy. No impingement.  L4-L5: 3 mm anterolisthesis is facet mediated. Posterior element hypertrophy. Loss of disc height and signal. Central and leftward protrusion extends to the foramen. Short pedicles. Severe stenosis. LEFT greater than RIGHT subarticular zone and foraminal zone narrowing.  L5-S1: Preserved disc height, with desiccation. Short pedicles. Facet arthropathy. Borderline stenosis without definite subarticular zone narrowing. No impingement.  S1-S2: Unremarkable disc space.  Facet arthropathy.  No impingement.  Correlating with plain films, from May 2020, there is significant increase anterolisthesis at L4-5 with patient standing in flexion, which measures 7 mm on the outside radiographs.  IMPRESSION: Transitional anatomy. S1-S2 is the last open disc space. See discussion above.  Progression of degenerative disc disease since 2016 with development of severe spinal stenosis at L4-5 of a multifactorial nature. 3 mm of facet mediated slip, post short pedicles, central and leftward protrusion, and posterior element hypertrophy. LEFT greater than RIGHT subarticular zone and foraminal zone narrowing at this level.  Dynamic instability, noted on prior radiographs at L4-5, up to 7 mm in standing flexion, further exacerbate stenosis and neural impingement at the L4-5 level.    Electronically Signed   By: Elsie StainJohn T Curnes M.D.   On: 08/23/2018 14:15    PMFS History: Patient Active Problem List   Diagnosis Date Noted  . Medication refill 08/30/2018  . Spinal stenosis of lumbar region 08/30/2018  . Homelessness 07/28/2018  . Age-related nuclear cataract of right eye 03/12/2016  . Multiple trauma to chest 08/23/2015  . Skin rash 07/28/2015  . Erectile dysfunction 06/13/2015  . Cataract 06/13/2015  . Aorto-iliac atherosclerosis (HCC)   . Smoker 01/26/2015  . Alcohol use 01/26/2015  . MDD (major depressive disorder), recurrent episode, moderate (HCC)   . Psoriasis 01/08/2015  . Macular degeneration   . Chronic low back pain 12/26/2014  . Emphysema of lung (HCC)   . Chronic hepatitis C without hepatic coma (HCC)   . Anxiety disorder due to general medical condition with panic attack   . GERD (gastroesophageal reflux disease)   . Hyperlipidemia   . Controlled diabetes mellitus with diabetic neuropathy (HCC) 09/16/2014  . Essential hypertension 09/16/2014  . BPH (benign prostatic hyperplasia) 09/16/2014  Past Medical History:  Diagnosis Date  . Anxiety disorder due to general medical condition with panic attack    h/o hospitalizations for behavioral issues (1980s, 2000s)  . Aorto-iliac atherosclerosis (HCC)   . Arthritis   . Cataract   . COPD (chronic obstructive pulmonary disease) (HCC)   . DDD (degenerative disc disease), lumbar    mild with dextroscoliosis  . Depression   . Emphysema of lung (HCC)    ?asthmatic bronchitis per prior PCP  . GERD (gastroesophageal reflux disease)   . Hepatitis C   . History of stomach ulcers   . Hyperlipidemia   . Hypertension   . Macular degeneration   . MDD (major depressive disorder), recurrent episode, moderate (HCC)   . Polysubstance (including opioids) dependence, daily use (HCC) 10/2015   narcotics, heroin  . 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 Problems  related to release from prison 07/2014  . Psoriasis 01/08/2015  . Type 2 diabetes, uncontrolled, with neuropathy (HCC)   . Urine incontinence     Family History  Problem Relation Age of Onset  . Diabetes Father   . Heart failure Father   . CAD Father   . Stroke Mother   . Hypertension Mother   . Mental illness Mother   . Depression Mother   . Irritable bowel syndrome Mother   . Bipolar disorder Brother        ?  Marland Kitchen. CAD Paternal Grandfather   . Stroke Maternal Grandfather     Past Surgical History:  Procedure Laterality Date  . STRABISMUS SURGERY     left eye vision loss, multiple surgeries  . TONSILLECTOMY     Social History   Occupational History  . Not on file  Tobacco Use  . Smoking status: Current Every Day Smoker    Packs/day: 1.00    Years: 29.00    Pack years: 29.00    Types: Cigarettes    Start date: 03/18/1973  . Smokeless tobacco: Former NeurosurgeonUser    Types: Snuff  . Tobacco comment: Has tried dip in the past some, quit for 7 year   Substance and Sexual Activity  . Alcohol use: Yes    Alcohol/week: 6.0 standard drinks    Types: 6 Cans of beer per week    Comment: occasional, not regular  . Drug use: No    Types: Marijuana, IV, Heroin, Oxycodone, Hydrocodone    Comment: 09/25/15 last use  . Sexual activity: Never

## 2018-08-26 NOTE — ED Triage Notes (Signed)
Pt presents to [facility] for another refill on his medications.  Pt states medicaid also wouldn't cover the last pain medication for his back from his last visit and is requesting something else.

## 2018-08-26 NOTE — ED Provider Notes (Signed)
[facility]    CSN: [id number] Arrival date & time: 08/26/18  1313     History   Chief Complaint Chief Complaint  Patient presents with  . Medication Refill    HPI Randy Barnes is a 58 y.o. male.   Patient is a 58 year old male with past medical history of anxiety, arthritis, COPD, DDD, depression, emphysema, GERD, hep C, hyperlipidemia, hypertension, macular degeneration, MDD, polysubstance abuse, alcohol use, homelessness, diabetes, neuropathy, chronic back pain.  He presents today needing medication refill.  Reporting he is still had trouble getting into a primary care doctor.  He was seen here for his last medication refill.  He is also having lower back pain which is chronic for him.  He was unable to get the medication that was prescribed previously for his back pain due to cost.  Currently he is staying with a friend and having transportation issues.  Denies any concerning symptoms to include chest pain, shortness of breath, palpitations, headache, dizziness, blurred vision.  Reports that he has been drinking heavy and trying to get into a rehab currently.  Reporting that he is drinking to numb the pain in his back.  His friend gave him 1 Vicodin that he took.   ROS per HPI      Past Medical History:  Diagnosis Date  . Anxiety disorder due to general medical condition with panic attack    h/o hospitalizations for behavioral issues (1980s, 2000s)  . Aorto-iliac atherosclerosis (HCC)   . Arthritis   . Cataract   . COPD (chronic obstructive pulmonary disease) (HCC)   . DDD (degenerative disc disease), lumbar    mild with dextroscoliosis  . Depression   . Emphysema of lung (HCC)    ?asthmatic bronchitis per prior PCP  . GERD (gastroesophageal reflux disease)   . Hepatitis C   . History of stomach ulcers   . Hyperlipidemia   . Hypertension   . Macular degeneration   . MDD (major depressive disorder), recurrent episode, moderate (HCC)   . Polysubstance  (including opioids) dependence, daily use (HCC) 10/2015   narcotics, heroin  . 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 Problems related to release from prison 07/2014  . Psoriasis 01/08/2015  . Type 2 diabetes, uncontrolled, with neuropathy (HCC)   . Urine incontinence     Patient Active Problem List   Diagnosis Date Noted  . Homelessness 07/28/2018  . Age-related nuclear cataract of right eye 03/12/2016  . Multiple trauma to chest 08/23/2015  . Skin rash 07/28/2015  . Erectile dysfunction 06/13/2015  . Cataract 06/13/2015  . Aorto-iliac atherosclerosis (HCC)   . Smoker 01/26/2015  . Alcohol use 01/26/2015  . MDD (major depressive disorder), recurrent episode, moderate (HCC)   . Psoriasis 01/08/2015  . Macular degeneration   . Chronic low back pain 12/26/2014  . Emphysema of lung (HCC)   . Chronic hepatitis C without hepatic coma (HCC)   . Anxiety disorder due to general medical condition with panic attack   . GERD (gastroesophageal reflux disease)   . Hyperlipidemia   . Controlled diabetes mellitus with diabetic neuropathy (HCC) 09/16/2014  . Essential hypertension 09/16/2014  . BPH (benign prostatic hyperplasia) 09/16/2014    Past Surgical History:  Procedure Laterality Date  . STRABISMUS SURGERY     left eye vision loss, multiple surgeries  . TONSILLECTOMY         Home Medications  Prior to Admission medications   Medication Sig Start Date End Date Taking? Authorizing Provider  acetaminophen (TYLENOL) 500 MG tablet Take 1 tablet (500 mg total) by mouth every 6 (six) hours as needed for mild pain, moderate pain, fever or headache. 08/26/18   Baylee Mccorkel A, NP  albuterol (VENTOLIN HFA) 108 (90 Base) MCG/ACT inhaler Inhale 2 puffs into the lungs every 6 (six) hours as needed for wheezing or shortness of breath. 08/26/18   Dahlia ByesBast, Castulo Scarpelli A, NP  amitriptyline (ELAVIL) 100 MG tablet Take 1 tablet (100 mg total) by mouth at  bedtime as needed for sleep. 08/26/18   Dahlia ByesBast, Essa Wenk A, NP  atenolol (TENORMIN) 25 MG tablet Take 1 tablet (25 mg total) by mouth daily. 08/26/18   Dahlia ByesBast, Jun Osment A, NP  budesonide-formoterol (SYMBICORT) 160-4.5 MCG/ACT inhaler Inhale 2 puffs into the lungs 2 (two) times daily. 08/26/18 08/26/19  Dahlia ByesBast, Kikuye Korenek A, NP  cariprazine (VRAYLAR) capsule Take 1 capsule (1.5 mg total) by mouth daily. 07/22/18   Arfeen, Phillips GroutSyed T, MD  gabapentin (NEURONTIN) 800 MG tablet Take 1 tablet (800 mg total) by mouth 4 (four) times daily. 08/26/18   Dahlia ByesBast, Reyan Helle A, NP  Glucose Blood (BLOOD GLUCOSE TEST STRIPS) STRP Use as directed 04/28/15   Eustaquio BoydenGutierrez, Javier, MD  ibuprofen (ADVIL) 400 MG tablet Take 1 tablet (400 mg total) by mouth every 8 (eight) hours as needed. 07/06/18   Melene PlanFloyd, Dan, DO  lisinopril (ZESTRIL) 10 MG tablet Take 1 tablet (10 mg total) by mouth daily. 08/26/18   Dahlia ByesBast, Quinita Kostelecky A, NP  meloxicam (MOBIC) 15 MG tablet Take 1 tablet (15 mg total) by mouth daily. 08/26/18   Dahlia ByesBast, Reeder Brisby A, NP  omeprazole (PRILOSEC) 20 MG capsule Take 1 capsule (20 mg total) by mouth 2 (two) times daily before a meal. 08/26/18   Olusegun Gerstenberger A, NP  salicylic acid-lactic acid 17 % external solution Apply topically daily. 07/06/18   Melene PlanFloyd, Dan, DO  tamsulosin (FLOMAX) 0.4 MG CAPS capsule Take 1 capsule by mouth twice a day 08/26/18   Janace ArisBast, Keyaria Lawson A, NP    Family History Family History  Problem Relation Age of Onset  . Diabetes Father   . Heart failure Father   . CAD Father   . Stroke Mother   . Hypertension Mother   . Mental illness Mother   . Depression Mother   . Irritable bowel syndrome Mother   . Bipolar disorder Brother        ?  Marland Kitchen. CAD Paternal Grandfather   . Stroke Maternal Grandfather     Social History Social History   Tobacco Use  . Smoking status: Current Every Day Smoker    Packs/day: 1.00    Years: 29.00    Pack years: 29.00    Types: Cigarettes    Start date: 03/18/1973  . Smokeless tobacco: Former NeurosurgeonUser    Types: Snuff   . Tobacco comment: Has tried dip in the past some, quit for 7 year   Substance Use Topics  . Alcohol use: Yes    Alcohol/week: 6.0 standard drinks    Types: 6 Cans of beer per week    Comment: occasional, not regular  . Drug use: No    Types: Marijuana, IV, Heroin, Oxycodone, Hydrocodone    Comment: 09/25/15 last use     Allergies   Penicillins and Guaifenesin & derivatives   Review of Systems Review of Systems   Physical Exam Triage Vital Signs ED Triage Vitals  Enc Vitals Group  BP 08/26/18 1340 (!) 161/105     Pulse Rate 08/26/18 1340 83     Resp 08/26/18 1340 20     Temp 08/26/18 1340 98 F (36.7 C)     Temp Source 08/26/18 1340 Oral     SpO2 08/26/18 1340 98 %     Weight --      Height --      Head Circumference --      Peak Flow --      Pain Score 08/26/18 1344 10     Pain Loc --      Pain Edu? --      Excl. in [place]? --    No data found.  Updated Vital Signs BP (!) 161/105 (BP Location: Right Arm)   Pulse 83   Temp 98 F (36.7 C) (Oral)   Resp 20   SpO2 98%   Visual Acuity Right Eye Distance:   Left Eye Distance:   Bilateral Distance:    Right Eye Near:   Left Eye Near:    Bilateral Near:     Physical Exam Vitals signs and nursing note reviewed.  Constitutional:      General: He is not in acute distress.    Appearance: He is not ill-appearing, toxic-appearing or diaphoretic.     Comments: Unkempt  HENT:     Head: Normocephalic and atraumatic.  Eyes:     Conjunctiva/sclera: Conjunctivae normal.  Neck:     Musculoskeletal: Normal range of motion.  Pulmonary:     Effort: Pulmonary effort is normal.  Musculoskeletal: Normal range of motion.     Lumbar back: He exhibits tenderness. He exhibits normal range of motion, no bony tenderness, no swelling, no edema, no deformity and no laceration.       Arms:  Skin:    General: Skin is warm and dry.  Neurological:     General: No focal deficit present.  Psychiatric:        Mood and Affect:  Mood normal.      UC Treatments / Results  Labs (all labs ordered are listed, but only abnormal results are displayed) Labs Reviewed - No data to display  EKG None  Radiology No results found.  Procedures Procedures (including critical care time)  Medications Ordered in UC Medications  ketorolac (TORADOL) injection 60 mg (60 mg Intramuscular Given 08/26/18 1426)    Initial Impression / Assessment and Plan / UC Course  I have reviewed the triage vital signs and the nursing notes.  Pertinent labs & imaging results that were available during my care of the patient were reviewed by me and considered in my medical decision making (see chart for details).     Medication refill and chronic back pain Refilled patient's medications and prescribed meloxicam for pain Gave contact for primary care for follow-up  Final Clinical Impressions(s) / UC Diagnoses   Final diagnoses:  Medication refill     Discharge Instructions     I have refilled your medications Meloxicam for pain, this is covered by [facility] Primary care contact put on your discharge instructions.  Follow up as needed for continued or worsening symptoms     ED Prescriptions    Medication Sig Dispense Auth. Provider   amitriptyline (ELAVIL) 100 MG tablet Take 1 tablet (100 mg total) by mouth at bedtime as needed for sleep. 30 tablet Yomira Flitton A, NP   atenolol (TENORMIN) 25 MG tablet Take 1 tablet (25 mg total) by mouth daily. 30 tablet Lamar,  Abbi Mancini A, NP   budesonide-formoterol (SYMBICORT) 160-4.5 MCG/ACT inhaler Inhale 2 puffs into the lungs 2 (two) times daily. 1 Inhaler Meygan Kyser A, NP   gabapentin (NEURONTIN) 800 MG tablet Take 1 tablet (800 mg total) by mouth 4 (four) times daily. 120 tablet Malikiah Debarr A, NP   lisinopril (ZESTRIL) 10 MG tablet Take 1 tablet (10 mg total) by mouth daily. 30 tablet Kellina Dreese A, NP   omeprazole (PRILOSEC) 20 MG capsule Take 1 capsule (20 mg total) by mouth 2 (two)  times daily before a meal. 60 capsule Mattis Featherly A, NP   tamsulosin (FLOMAX) 0.4 MG CAPS capsule Take 1 capsule by mouth twice a day 60 capsule Tabatha Razzano A, NP   albuterol (VENTOLIN HFA) 108 (90 Base) MCG/ACT inhaler Inhale 2 puffs into the lungs every 6 (six) hours as needed for wheezing or shortness of breath. 1 Inhaler Jaesean Litzau A, NP   acetaminophen (TYLENOL) 500 MG tablet Take 1 tablet (500 mg total) by mouth every 6 (six) hours as needed for mild pain, moderate pain, fever or headache. 60 tablet Nilaya Bouie A, NP   meloxicam (MOBIC) 15 MG tablet Take 1 tablet (15 mg total) by mouth daily. 30 tablet Dahlia ByesBast, Bernarr Longsworth A, NP     Controlled Substance Prescriptions [place] Controlled Substance Registry consulted? Not Applicable   Janace ArisBast, Tifanie Gardiner A, NP 08/26/18 [phone]

## 2018-08-30 ENCOUNTER — Encounter: Payer: Self-pay | Admitting: Primary Care

## 2018-08-30 DIAGNOSIS — M48061 Spinal stenosis, lumbar region without neurogenic claudication: Secondary | ICD-10-CM | POA: Insufficient documentation

## 2018-08-30 DIAGNOSIS — Z760 Encounter for issue of repeat prescription: Secondary | ICD-10-CM | POA: Insufficient documentation

## 2018-08-30 DIAGNOSIS — M48062 Spinal stenosis, lumbar region with neurogenic claudication: Secondary | ICD-10-CM | POA: Insufficient documentation

## 2018-09-01 ENCOUNTER — Telehealth: Payer: Self-pay

## 2018-09-01 NOTE — Telephone Encounter (Signed)
Call received from [facility] # [phone] stating that she received a call from the patient's mother who has concerns about her son. She said that he has been couch surfing, paying for motels and is staying with someone today but needs to leave tonight.  Jackelyn Poling was inquiring about inpatient treatment for substance abuse.  Informed her that this CM would check with [facility] regarding bed availability.   Call placed to [facility] # [phone]. Spoke to June who placed patient on the waiting list but noted that the wait is currently about 2 weeks. She explained that the patient will need to have an ID, a months supply of medication and refills for all meds. She also needs to confirm that the patient has [facility].   Call placed to Epimenio Sarin to inform her of above conversation with June.  Message left requesting a call back to this CM # [phone].

## 2018-09-02 ENCOUNTER — Other Ambulatory Visit: Payer: Self-pay

## 2018-09-02 ENCOUNTER — Ambulatory Visit (INDEPENDENT_AMBULATORY_CARE_PROVIDER_SITE_OTHER): Payer: Medicaid Other | Admitting: Psychiatry

## 2018-09-02 ENCOUNTER — Encounter (HOSPITAL_COMMUNITY): Payer: Self-pay | Admitting: Psychiatry

## 2018-09-02 ENCOUNTER — Telehealth: Payer: Self-pay | Admitting: Internal Medicine

## 2018-09-02 DIAGNOSIS — F1210 Cannabis abuse, uncomplicated: Secondary | ICD-10-CM | POA: Diagnosis not present

## 2018-09-02 DIAGNOSIS — Z789 Other specified health status: Secondary | ICD-10-CM

## 2018-09-02 DIAGNOSIS — F319 Bipolar disorder, unspecified: Secondary | ICD-10-CM | POA: Diagnosis not present

## 2018-09-02 DIAGNOSIS — Z7289 Other problems related to lifestyle: Secondary | ICD-10-CM

## 2018-09-02 MED ORDER — CARIPRAZINE HCL 3 MG PO CAPS: 3.0000 mg | ORAL_CAPSULE | Freq: Every day | ORAL | 2 refills | Status: DC

## 2018-09-02 NOTE — Telephone Encounter (Signed)
NOTED. THANK YOU.

## 2018-09-02 NOTE — Telephone Encounter (Signed)
Call received from [facility]. Explained to her that patient's medicaid needs to be switched to [facility].  As per [place], [place], the patient's medicaid is [place].  Also explained that he will need an ID as well as a month's worth of medications and refills.  Debbie said that she thinks he has an ID ( there is one documented in [place]) and  she would speak to his mother about changing the medicaid. She said that as per his mother he is in agreement to go to a facility for treatment.  He is not living on the street and is currently staying with someone. He is not allowed to stay with his mother at this time. Jackelyn Poling stated that her (Debbie's)  phone number can be used as the contact for [facility].   Call placed to [facility], spoke to June and informed her that the patient does not have [facility]  June said that his name can't be placed on the wait list until it is confirmed that he has [place] Medicaid.  Call placed to Epimenio Sarin and informed her that the patient can't be put on the wait list until the medicaid is officially changed to [employer]. She said that she is now waiting for a call back from his mother and will inform her.  Provided Debbie with the phone number for June at [facility].

## 2018-09-02 NOTE — Telephone Encounter (Signed)
COVID-19 Pre-Screening Questions: ° °Do you currently have a fever (>100 °F), chills or unexplained body aches? No  ° °Are you currently experiencing new cough, shortness of breath, sore throat, runny nose?no   °•  °Have you recently travelled outside the state of [place] in the last 14 days? No  °•  °1. Have you been in contact with someone that is currently pending confirmation of Covid19 testing or has been confirmed to have the Covid19 virus?  No  ° °
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
## 2018-09-03 ENCOUNTER — Encounter: Payer: Self-pay | Admitting: Internal Medicine

## 2018-09-03 ENCOUNTER — Other Ambulatory Visit: Payer: Self-pay

## 2018-09-03 ENCOUNTER — Telehealth: Payer: Self-pay | Admitting: Pharmacy Technician

## 2018-09-03 ENCOUNTER — Ambulatory Visit (INDEPENDENT_AMBULATORY_CARE_PROVIDER_SITE_OTHER): Payer: Medicaid Other | Admitting: Internal Medicine

## 2018-09-03 VITALS — BP 144/88 | HR 99 | Temp 98.3°F | Wt 222.0 lb

## 2018-09-03 DIAGNOSIS — B182 Chronic viral hepatitis C: Secondary | ICD-10-CM

## 2018-09-03 DIAGNOSIS — F064 Anxiety disorder due to known physiological condition: Secondary | ICD-10-CM

## 2018-09-03 DIAGNOSIS — Z760 Encounter for issue of repeat prescription: Secondary | ICD-10-CM | POA: Diagnosis not present

## 2018-09-03 DIAGNOSIS — F410 Panic disorder [episodic paroxysmal anxiety] without agoraphobia: Secondary | ICD-10-CM | POA: Diagnosis not present

## 2018-09-03 NOTE — Assessment & Plan Note (Signed)
He was provided information about getting reestablished with a PCP

## 2018-09-03 NOTE — Assessment & Plan Note (Signed)
I encouraged him to get back in to psychiatric care.

## 2018-09-03 NOTE — Telephone Encounter (Signed)
[facility] Patient Advocate Encounter    Findings of the benefits investigation conducted this morning via test claims for the patient's upcoming appointment on 09/03/2018 are as follows:   Insurance: [employer]- active status  Prior Authorization: will begin [employer] once medication is prescribed   [facility] Patient Advocate will follow up once patient arrives for their appointment and have him fill out the readiness form.  Bartholomew Crews, CPhT Specialty Pharmacy Patient [facility] Phone: [phone] Fax: [phone] 09/03/2018 1:44 PM

## 2018-09-03 NOTE — Progress Notes (Signed)
   Subjective:    Patient ID: Randy Barnes, male    DOB: Sep 07, 1960, 58 y.o.   MRN: [id number]  HPI Here for follow up of chronic hepatitis C I saw him as a new patient in 2017 and had very low level viremia and had recent drug use and I suspected he was spontaneously clearing the viral load.  I was supposed to see him again after 6 months to recheck but he had not been back.  He currently is homeless and has continued struggles with drugs and alcohol.  He is looking to get in to alcohol rehab.  He also endorses that he has not been on his appropriate psychiatric medications.    Review of Systems  Constitutional: Negative for fatigue.  Skin: Negative for rash.  Neurological: Negative for dizziness.  Psychiatric/Behavioral: Positive for sleep disturbance. Negative for suicidal ideas.       Objective:   Physical Exam Constitutional:      Appearance: Normal appearance.  Eyes:     General: No scleral icterus. Cardiovascular:     Rate and Rhythm: Regular rhythm.     Heart sounds: No murmur.  Pulmonary:     Breath sounds: Normal breath sounds.  Skin:    Findings: No rash.  Neurological:     Mental Status: He is alert.   SH: + alcohol and tobacco        Assessment & Plan:

## 2018-09-03 NOTE — Assessment & Plan Note (Signed)
Will recheck his viral load and if positive, consider treatment. He will return in about 2 weeks

## 2018-09-04 NOTE — Telephone Encounter (Signed)
Patient left appointment yesterday before I could get him to sign the readiness form for [facility] Medicaid. He has a follow-up appointment 09/17/2018 @ 10:45am to discuss lab work and treatment option, we will need to get him to sign at that appointment to begin the prior authorization process.

## 2018-09-09 LAB — COMPLETE METABOLIC PANEL WITH GFR
AG Ratio: 1.2 (calc) (ref 1.0–2.5)
ALT: 9 U/L (ref 9–46)
AST: 20 U/L (ref 10–35)
Albumin: 4.4 g/dL (ref 3.6–5.1)
Alkaline phosphatase (APISO): 65 U/L (ref 35–144)
BUN: 12 mg/dL (ref 7–25)
CO2: 27 mmol/L (ref 20–32)
Calcium: 9.5 mg/dL (ref 8.6–10.3)
Chloride: 104 mmol/L (ref 98–110)
Creat: 0.87 mg/dL (ref 0.70–1.33)
GFR, Est African American: 112 mL/min/{1.73_m2} (ref >=60)
GFR, Est Non African American: 96 mL/min/{1.73_m2} (ref >=60)
Globulin: 3.7 g/dL (calc) (ref 1.9–3.7)
Glucose, Bld: 80 mg/dL (ref 65–99)
Potassium: 4.4 mmol/L (ref 3.5–5.3)
Sodium: 140 mmol/L (ref 135–146)
Total Bilirubin: 0.4 mg/dL (ref 0.2–1.2)
Total Protein: 8.1 g/dL (ref 6.1–8.1)

## 2018-09-09 LAB — HCV RNA,QN PCR RFLX GENO, LIPABAD
HCV RNA, PCR, QN (Log): 1.42 log IU/mL — ABNORMAL HIGH
HCV RNA, PCR, QN: 27 IU/mL — ABNORMAL HIGH

## 2018-09-11 ENCOUNTER — Ambulatory Visit: Payer: Medicaid Other | Admitting: Nurse Practitioner

## 2018-09-15 ENCOUNTER — Other Ambulatory Visit: Payer: Self-pay

## 2018-09-15 ENCOUNTER — Ambulatory Visit: Payer: Self-pay

## 2018-09-15 ENCOUNTER — Ambulatory Visit (INDEPENDENT_AMBULATORY_CARE_PROVIDER_SITE_OTHER): Payer: Medicaid Other | Admitting: Physical Medicine and Rehabilitation

## 2018-09-15 ENCOUNTER — Encounter: Payer: Self-pay | Admitting: Physical Medicine and Rehabilitation

## 2018-09-15 VITALS — BP 158/83 | HR 60

## 2018-09-15 DIAGNOSIS — M5416 Radiculopathy, lumbar region: Secondary | ICD-10-CM | POA: Diagnosis not present

## 2018-09-15 MED ORDER — BETAMETHASONE SOD PHOS & ACET 6 (3-3) MG/ML IJ SUSP
12.0000 mg | Freq: Once | INTRAMUSCULAR | Status: AC
  Administered 2018-09-15: 12 mg

## 2018-09-15 NOTE — Progress Notes (Signed)
.  Numeric Pain Rating Scale and Functional Assessment Average Pain 10   In the last MONTH (on 0-10 scale) has pain interfered with the following?  1. General activity like being  able to carry out your everyday physical activities such as walking, climbing stairs, carrying groceries, or moving a chair?  Rating(9)   +Driver, -BT, -Dye Allergies.  

## 2018-09-16 NOTE — Progress Notes (Signed)
Randy Barnes - 58 y.o. male MRN [id number]  Date of birth: 20-Jun-1960  Office Visit Note: Visit Date: 09/15/2018 PCP: Gildardo Pounds, NP Referred by: Gildardo Pounds, NP  Subjective: Chief Complaint  Patient presents with  . Lower Back - Pain  . Right Leg - Pain  . Left Leg - Pain   HPI:  Randy Barnes is a 58 y.o. male who comes in today At the request of Dr. Rodell Perna for bilateral L4 transforaminal epidural steroid injection.  Patient is having low back and bilateral radicular pain with significant stenosis at L4-5.  He is failed conservative care otherwise.  ROS Otherwise per HPI.  Assessment & Plan: Visit Diagnoses:  1. Lumbar radiculopathy     Plan: No additional findings.   Meds & Orders:  Meds ordered this encounter  Medications  . betamethasone acetate-betamethasone sodium phosphate (CELESTONE) injection 12 mg    Orders Placed This Encounter  Procedures  . XR C-ARM NO REPORT  . Epidural Steroid injection    Follow-up: Return in 2 weeks (on 09/29/2018) for Rodell Perna, MD.   Procedures: No procedures performed  Lumbosacral Transforaminal Epidural Steroid Injection - Sub-Pedicular Approach with Fluoroscopic Guidance  Patient: Randy Barnes      Date of Birth: 1961/01/23 MRN: [id number] PCP: Gildardo Pounds, NP      Visit Date: 09/15/2018   Universal Protocol:    Date/Time: 09/15/2018  Consent Given By: the patient  Position: PRONE  Additional Comments: Vital signs were monitored before and after the procedure. Patient was prepped and draped in the usual sterile fashion. The correct patient, procedure, and site was verified.   Injection Procedure Details:  Procedure Site One Meds Administered:  Meds ordered this encounter  Medications  . betamethasone acetate-betamethasone sodium phosphate (CELESTONE) injection 12 mg    Laterality: Bilateral  Location/Site:  L4-L5  Needle size: 22 G  Needle type: Spinal  Needle Placement:  Transforaminal  Findings:    -Comments: Excellent flow of contrast along the nerve and into the epidural space.  Procedure Details: After squaring off the end-plates to get a true AP view, the C-arm was positioned so that an oblique view of the foramen as noted above was visualized. The target area is just inferior to the "nose of the scotty dog" or sub pedicular. The soft tissues overlying this structure were infiltrated with 2-3 ml. of 1% Lidocaine without Epinephrine.  The spinal needle was inserted toward the target using a "trajectory" view along the fluoroscope beam.  Under AP and lateral visualization, the needle was advanced so it did not puncture dura and was located close the 6 O'Clock position of the pedical in AP tracterory. Biplanar projections were used to confirm position. Aspiration was confirmed to be negative for CSF and/or blood. A 1-2 ml. volume of Isovue-250 was injected and flow of contrast was noted at each level. Radiographs were obtained for documentation purposes.   After attaining the desired flow of contrast documented above, a 0.5 to 1.0 ml test dose of 0.25% Marcaine was injected into each respective transforaminal space.  The patient was observed for 90 seconds post injection.  After no sensory deficits were reported, and normal lower extremity motor function was noted,   the above injectate was administered so that equal amounts of the injectate were placed at each foramen (level) into the transforaminal epidural space.   Additional Comments:  The patient tolerated the procedure well Dressing: 2 x 2 sterile gauze and  Band-Aid    Post-procedure details: Patient was observed during the procedure. Post-procedure instructions were reviewed.  Patient left the clinic in stable condition.     Clinical History: MRI LUMBAR SPINE WITHOUT CONTRAST  TECHNIQUE: Multiplanar, multisequence MR imaging of the lumbar spine was performed. No intravenous contrast was  administered.  COMPARISON:  Plain films 07/29/2018.  MRI lumbar spine 02/07/2015.  FINDINGS: Segmentation: Transitional anatomy. 12 rib-bearing thoracic vertebrae, in 6 non-rib-bearing vertebrae. S1-S2 is therefore the lowest open disc space. Numbering scheme was used previously is continued.  Alignment:  3 mm anterolisthesis L4-5. otherwise anatomic.  Vertebrae:  No worrisome osseous lesion.  Conus medullaris and cauda equina: Conus extends to the L1-L2 level. Conus and cauda equina appear normal.  Paraspinal and other soft tissues: Unremarkable.  Disc levels:  L1-L2: Chronic disc space narrowing. Annular bulge. No impingement.  L2-L3:  Normal interspace.  L3-L4: Loss of disc height and signal due to Schmorl's node. Annular bulge. Short pedicles. Posterior element hypertrophy. No impingement.  L4-L5: 3 mm anterolisthesis is facet mediated. Posterior element hypertrophy. Loss of disc height and signal. Central and leftward protrusion extends to the foramen. Short pedicles. Severe stenosis. LEFT greater than RIGHT subarticular zone and foraminal zone narrowing.  L5-S1: Preserved disc height, with desiccation. Short pedicles. Facet arthropathy. Borderline stenosis without definite subarticular zone narrowing. No impingement.  S1-S2: Unremarkable disc space.  Facet arthropathy.  No impingement.  Correlating with plain films, from May 2020, there is significant increase anterolisthesis at L4-5 with patient standing in flexion, which measures 7 mm on the outside radiographs.  IMPRESSION: Transitional anatomy. S1-S2 is the last open disc space. See discussion above.  Progression of degenerative disc disease since 2016 with development of severe spinal stenosis at L4-5 of a multifactorial nature. 3 mm of facet mediated slip, post short pedicles, central and leftward protrusion, and posterior element hypertrophy. LEFT greater than RIGHT subarticular zone  and foraminal zone narrowing at this level.  Dynamic instability, noted on prior radiographs at L4-5, up to 7 mm in standing flexion, further exacerbate stenosis and neural impingement at the L4-5 level.   Electronically Signed   By: Elsie StainJohn T Curnes M.D.   On: 08/23/2018 14:15     Objective:  VS:  HT:    WT:   BMI:     BP:(!) 158/83  HR:60bpm  TEMP: ( )  RESP:  Physical Exam  Ortho Exam Imaging: No results found.

## 2018-09-17 ENCOUNTER — Ambulatory Visit: Payer: Medicaid Other | Admitting: Internal Medicine

## 2018-09-17 NOTE — Procedures (Signed)
Lumbosacral Transforaminal Epidural Steroid Injection - Sub-Pedicular Approach with Fluoroscopic Guidance  Patient: Randy Barnes      Date of Birth: [birth year] MRN: [id number] PCP: Gildardo Pounds, NP      Visit Date: 09/15/2018   Universal Protocol:    Date/Time: 09/15/2018  Consent Given By: the patient  Position: PRONE  Additional Comments: Vital signs were monitored before and after the procedure. Patient was prepped and draped in the usual sterile fashion. The correct patient, procedure, and site was verified.   Injection Procedure Details:  Procedure Site One Meds Administered:  Meds ordered this encounter  Medications  . betamethasone acetate-betamethasone sodium phosphate (CELESTONE) injection 12 mg    Laterality: Bilateral  Location/Site:  L4-L5  Needle size: 22 G  Needle type: Spinal  Needle Placement: Transforaminal  Findings:    -Comments: Excellent flow of contrast along the nerve and into the epidural space.  Procedure Details: After squaring off the end-plates to get a true AP view, the C-arm was positioned so that an oblique view of the foramen as noted above was visualized. The target area is just inferior to the "nose of the scotty dog" or sub pedicular. The soft tissues overlying this structure were infiltrated with 2-3 ml. of 1% Lidocaine without Epinephrine.  The spinal needle was inserted toward the target using a "trajectory" view along the fluoroscope beam.  Under AP and lateral visualization, the needle was advanced so it did not puncture dura and was located close the 6 O'Clock position of the pedical in AP tracterory. Biplanar projections were used to confirm position. Aspiration was confirmed to be negative for CSF and/or blood. A 1-2 ml. volume of Isovue-250 was injected and flow of contrast was noted at each level. Radiographs were obtained for documentation purposes.   After attaining the desired flow of contrast documented above, a 0.5  to 1.0 ml test dose of 0.25% Marcaine was injected into each respective transforaminal space.  The patient was observed for 90 seconds post injection.  After no sensory deficits were reported, and normal lower extremity motor function was noted,   the above injectate was administered so that equal amounts of the injectate were placed at each foramen (level) into the transforaminal epidural space.   Additional Comments:  The patient tolerated the procedure well Dressing: 2 x 2 sterile gauze and Band-Aid    Post-procedure details: Patient was observed during the procedure. Post-procedure instructions were reviewed.  Patient left the clinic in stable condition.

## 2018-09-17 NOTE — Telephone Encounter (Signed)
Created in error

## 2018-09-22 ENCOUNTER — Telehealth: Payer: Self-pay

## 2018-09-22 NOTE — Telephone Encounter (Signed)
Call placed to [facility]. She explained that she has not heard from the patient. Her last conversation was with his mother and she encouraged his mother to transfer the patient's medicaid to [facility]. She said that his mother asked about motels for him, stating that she would pay for his room.  Debbie provided her with some motel resources.

## 2018-10-02 ENCOUNTER — Encounter: Payer: Self-pay | Admitting: Orthopaedic Surgery

## 2018-10-02 ENCOUNTER — Ambulatory Visit (INDEPENDENT_AMBULATORY_CARE_PROVIDER_SITE_OTHER): Payer: Medicaid Other | Admitting: Orthopaedic Surgery

## 2018-10-02 ENCOUNTER — Other Ambulatory Visit: Payer: Self-pay

## 2018-10-02 VITALS — Ht 74.0 in | Wt 222.0 lb

## 2018-10-02 DIAGNOSIS — M48062 Spinal stenosis, lumbar region with neurogenic claudication: Secondary | ICD-10-CM | POA: Diagnosis not present

## 2018-10-02 NOTE — Progress Notes (Signed)
Office Visit Note   Patient: Randy Barnes           Date of Birth: Jan 23, 1961           MRN: [id number] Visit Date: 10/02/2018              Requested by: Gildardo Pounds, NP [place],  [place] [address] PCP: Gildardo Pounds, NP   Assessment & Plan: Visit Diagnoses:  1. Spinal stenosis of lumbar region with neurogenic claudication     Plan: Patient will return in 2 months.  His mother is in the office today he seems to be doing much better.  At times he is not had stable place to stay has been in hotels.  He has problems with depression and alcohol use in the past.  I plan to recheck him again in 2 months.  He is happy with the results of the epidural and is got improvement in his symptoms.  Follow-Up Instructions: Return in about 2 months (around 12/03/2018).   Orders:  No orders of the defined types were placed in this encounter.  No orders of the defined types were placed in this encounter.     Procedures: No procedures performed   Clinical Data: No additional findings.   Subjective: Chief Complaint  Patient presents with  . Lower Back - Follow-up    Had Bil L4-L5 TF injection, he states that he did get relief    HPI 58 year old male returns for ongoing problems with lumbar stenosis at L4-5.  He has some anterolisthesis at L4-5 and is got an epidural and states his back is significantly better.  Patient is here with his mother he is cut off his beard is here been cut and he states he has seen Dr. Linus Salmons to repeat his hepatitis labs and has an appointment coming up with him which he needs to call and check to make sure he is not missed that appointment.  He states he is moving better after the epidural.  We previously discussed possible surgical intervention.  Review of Systems 14 point update is updated and is unchanged from last office visit as pertains HPI.   Objective: Vital Signs: Ht 6\' 2"  (1.88 m)   Wt 222 lb (100.7 kg)   BMI 28.50 kg/m    Physical Exam Constitutional:      Appearance: He is well-developed.  HENT:     Head: Normocephalic and atraumatic.  Eyes:     Pupils: Pupils are equal, round, and reactive to light.  Neck:     Thyroid: No thyromegaly.     Trachea: No tracheal deviation.  Cardiovascular:     Rate and Rhythm: Normal rate.  Pulmonary:     Effort: Pulmonary effort is normal.     Breath sounds: No wheezing.  Abdominal:     General: Bowel sounds are normal.     Palpations: Abdomen is soft.  Skin:    General: Skin is warm and dry.     Capillary Refill: Capillary refill takes less than 2 seconds.  Neurological:     Mental Status: He is alert and oriented to person, place, and time.  Psychiatric:        Behavior: Behavior normal.        Thought Content: Thought content normal.        Judgment: Judgment normal.     Ortho Exam patient is amatory still has slight angulation to the left with standing.  Anterior tib gastrocsoleus  is strong.  He is moving better from sitting to standing.  Negative logroll to the hips.  No rash over exposed skin.  Specialty Comments:  No specialty comments available.  Imaging: CLINICAL DATA:  Low back pain radiating to the hips. Symptoms for years.  EXAM: MRI LUMBAR SPINE WITHOUT CONTRAST  TECHNIQUE: Multiplanar, multisequence MR imaging of the lumbar spine was performed. No intravenous contrast was administered.  COMPARISON:  Plain films 07/29/2018.  MRI lumbar spine 02/07/2015.  FINDINGS: Segmentation: Transitional anatomy. 12 rib-bearing thoracic vertebrae, in 6 non-rib-bearing vertebrae. S1-S2 is therefore the lowest open disc space. Numbering scheme was used previously is continued.  Alignment:  3 mm anterolisthesis L4-5. otherwise anatomic.  Vertebrae:  No worrisome osseous lesion.  Conus medullaris and cauda equina: Conus extends to the L1-L2 level. Conus and cauda equina appear normal.  Paraspinal and other soft tissues: Unremarkable.   Disc levels:  L1-L2: Chronic disc space narrowing. Annular bulge. No impingement.  L2-L3:  Normal interspace.  L3-L4: Loss of disc height and signal due to Schmorl's node. Annular bulge. Short pedicles. Posterior element hypertrophy. No impingement.  L4-L5: 3 mm anterolisthesis is facet mediated. Posterior element hypertrophy. Loss of disc height and signal. Central and leftward protrusion extends to the foramen. Short pedicles. Severe stenosis. LEFT greater than RIGHT subarticular zone and foraminal zone narrowing.  L5-S1: Preserved disc height, with desiccation. Short pedicles. Facet arthropathy. Borderline stenosis without definite subarticular zone narrowing. No impingement.  S1-S2: Unremarkable disc space.  Facet arthropathy.  No impingement.  Correlating with plain films, from May 2020, there is significant increase anterolisthesis at L4-5 with patient standing in flexion, which measures 7 mm on the outside radiographs.  IMPRESSION: Transitional anatomy. S1-S2 is the last open disc space. See discussion above.  Progression of degenerative disc disease since 2016 with development of severe spinal stenosis at L4-5 of a multifactorial nature. 3 mm of facet mediated slip, post short pedicles, central and leftward protrusion, and posterior element hypertrophy. LEFT greater than RIGHT subarticular zone and foraminal zone narrowing at this level.  Dynamic instability, noted on prior radiographs at L4-5, up to 7 mm in standing flexion, further exacerbate stenosis and neural impingement at the L4-5 level.   Electronically Signed   By: Elsie StainJohn T Curnes M.D.   On: 08/23/2018 14:15   PMFS History: Patient Active Problem List   Diagnosis Date Noted  . Medication refill 08/30/2018  . Spinal stenosis of lumbar region 08/30/2018  . Homelessness 07/28/2018  . Age-related nuclear cataract of right eye 03/12/2016  . Multiple trauma to chest 08/23/2015  . Skin rash  07/28/2015  . Erectile dysfunction 06/13/2015  . Cataract 06/13/2015  . Aorto-iliac atherosclerosis (HCC)   . Smoker 01/26/2015  . Alcohol use 01/26/2015  . MDD (major depressive disorder), recurrent episode, moderate (HCC)   . Psoriasis 01/08/2015  . Macular degeneration   . Chronic low back pain 12/26/2014  . Emphysema of lung (HCC)   . Chronic hepatitis C without hepatic coma (HCC)   . Anxiety disorder due to general medical condition with panic attack   . GERD (gastroesophageal reflux disease)   . Hyperlipidemia   . Controlled diabetes mellitus with diabetic neuropathy (HCC) 09/16/2014  . Essential hypertension 09/16/2014  . BPH (benign prostatic hyperplasia) 09/16/2014   Past Medical History:  Diagnosis Date  . Anxiety disorder due to general medical condition with panic attack    h/o hospitalizations for behavioral issues (1980s, 2000s)  . Aorto-iliac atherosclerosis (HCC)   . Arthritis   .  Cataract   . COPD (chronic obstructive pulmonary disease) (HCC)   . DDD (degenerative disc disease), lumbar    mild with dextroscoliosis  . Depression   . Emphysema of lung (HCC)    ?asthmatic bronchitis per prior PCP  . GERD (gastroesophageal reflux disease)   . Hepatitis C   . History of stomach ulcers   . Hyperlipidemia   . Hypertension   . Macular degeneration   . MDD (major depressive disorder), recurrent episode, moderate (HCC)   . Polysubstance (including opioids) dependence, daily use (HCC) 10/2015   narcotics, heroin  . 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 Problems related to release from prison 07/2014  . Psoriasis 01/08/2015  . Type 2 diabetes, uncontrolled, with neuropathy (HCC)   . Urine incontinence     Family History  Problem Relation Age of Onset  . Diabetes Father   . Heart failure Father   . CAD Father   . Stroke Mother   . Hypertension Mother   . Mental illness Mother   . Depression Mother   . Irritable  bowel syndrome Mother   . Bipolar disorder Brother        ?  Marland Kitchen. CAD Paternal Grandfather   . Stroke Maternal Grandfather     Past Surgical History:  Procedure Laterality Date  . STRABISMUS SURGERY     left eye vision loss, multiple surgeries  . TONSILLECTOMY     Social History   Occupational History  . Not on file  Tobacco Use  . Smoking status: Current Every Day Smoker    Packs/day: 1.00    Years: 29.00    Pack years: 29.00    Types: Cigarettes    Start date: 03/18/1973  . Smokeless tobacco: Former NeurosurgeonUser    Types: Snuff  . Tobacco comment: Has tried dip in the past some, quit for 7 year   Substance and Sexual Activity  . Alcohol use: Yes    Alcohol/week: 6.0 standard drinks    Types: 6 Cans of beer per week    Comment: occasional, not regular  . Drug use: No    Types: Marijuana, IV, Heroin, Oxycodone, Hydrocodone    Comment: 09/25/15 last use  . Sexual activity: Never

## 2018-10-09 ENCOUNTER — Encounter (HOSPITAL_COMMUNITY): Payer: Self-pay

## 2018-10-09 ENCOUNTER — Ambulatory Visit (HOSPITAL_COMMUNITY)
Admission: EM | Admit: 2018-10-09 | Discharge: 2018-10-09 | Disposition: A | Discharge status: Home / Self Care | Payer: Medicaid Other | Attending: Family Medicine | Admitting: Family Medicine

## 2018-10-09 ENCOUNTER — Other Ambulatory Visit: Payer: Self-pay

## 2018-10-09 DIAGNOSIS — F1210 Cannabis abuse, uncomplicated: Secondary | ICD-10-CM

## 2018-10-09 DIAGNOSIS — Z760 Encounter for issue of repeat prescription: Secondary | ICD-10-CM | POA: Diagnosis not present

## 2018-10-09 DIAGNOSIS — M545 Low back pain, unspecified: Secondary | ICD-10-CM

## 2018-10-09 DIAGNOSIS — F1090 Alcohol use, unspecified, uncomplicated: Secondary | ICD-10-CM

## 2018-10-09 DIAGNOSIS — I10 Essential (primary) hypertension: Secondary | ICD-10-CM

## 2018-10-09 DIAGNOSIS — F319 Bipolar disorder, unspecified: Secondary | ICD-10-CM

## 2018-10-09 DIAGNOSIS — G8929 Other chronic pain: Secondary | ICD-10-CM

## 2018-10-09 DIAGNOSIS — Z789 Other specified health status: Secondary | ICD-10-CM

## 2018-10-09 DIAGNOSIS — Z7289 Other problems related to lifestyle: Secondary | ICD-10-CM

## 2018-10-09 MED ORDER — LISINOPRIL 10 MG PO TABS: 10.0000 mg | ORAL_TABLET | Freq: Every day | ORAL | 0 refills | Status: DC

## 2018-10-09 MED ORDER — ALBUTEROL SULFATE HFA 108 (90 BASE) MCG/ACT IN AERS: 2.0000 | INHALATION_SPRAY | Freq: Four times a day (QID) | RESPIRATORY_TRACT | 0 refills | Status: DC | PRN

## 2018-10-09 MED ORDER — GABAPENTIN 800 MG PO TABS: 800.0000 mg | ORAL_TABLET | Freq: Four times a day (QID) | ORAL | 0 refills | Status: DC

## 2018-10-09 MED ORDER — OMEPRAZOLE 20 MG PO CPDR: 20.0000 mg | DELAYED_RELEASE_CAPSULE | Freq: Two times a day (BID) | ORAL | 0 refills | Status: DC

## 2018-10-09 MED ORDER — BUDESONIDE-FORMOTEROL FUMARATE 160-4.5 MCG/ACT IN AERO: 2.0000 | INHALATION_SPRAY | Freq: Two times a day (BID) | RESPIRATORY_TRACT | 0 refills | Status: DC

## 2018-10-09 MED ORDER — CARIPRAZINE HCL 3 MG PO CAPS: 3.0000 mg | ORAL_CAPSULE | Freq: Every day | ORAL | 0 refills | Status: AC

## 2018-10-09 MED ORDER — ATENOLOL 25 MG PO TABS: 25.0000 mg | ORAL_TABLET | Freq: Every day | ORAL | 0 refills | Status: DC

## 2018-10-09 MED ORDER — ACETAMINOPHEN 500 MG PO TABS: 500.0000 mg | ORAL_TABLET | Freq: Four times a day (QID) | ORAL | 0 refills | Status: DC | PRN

## 2018-10-09 MED ORDER — AMITRIPTYLINE HCL 100 MG PO TABS: 100.0000 mg | ORAL_TABLET | Freq: Every evening | ORAL | 0 refills | Status: DC | PRN

## 2018-10-09 MED ORDER — TAMSULOSIN HCL 0.4 MG PO CAPS: ORAL_CAPSULE | ORAL | 0 refills | Status: DC

## 2018-10-09 NOTE — ED Triage Notes (Signed)
Pt presents with chronic back pain problems.  Pt also presents to get a refill on blood pressure medications; Pt has been warned the last 2 visits he need to get a PCP for Gabapentin refill and continues to use urgent to get refills for gabapentin.

## 2018-10-09 NOTE — Discharge Instructions (Signed)
Please call today to establish with a primary care provider.  I have provided you with 14 days of additional medications as we do not manage chronic medications here from urgent care.

## 2018-10-09 NOTE — ED Provider Notes (Signed)
[facility]    CSN: [id number] Arrival date & time: 10/09/18  1336      History   Chief Complaint Chief Complaint  Patient presents with  . Back Pain  . Medication Refill    Refill on BP Medication     HPI Randy BattiestRoger D Barnes is a 58 y.o. male.   Randy Barnes presents with requests for refill of his medications. He has a few days left but is about to run out. States he had an unsatisfactory experience at his PCP office therefore does not wish to go back and has not found a new PCP. He follows with Ortho for chronic back pain, states he is to have surgery. Saw ortho last 7/17. He also follows with I/D for hep c. Hx of htn, depression, anxiety, copd, polysubstance abuse, dm. Was seen here in UC for med refill last on 6/10, was educated to establish with a PCP at that time as well. No new complaints today. Persistent chronic low back pain. He has not had to use his cane recently, however.     ROS per HPI, negative if not otherwise mentioned.      Past Medical History:  Diagnosis Date  . Anxiety disorder due to general medical condition with panic attack    h/o hospitalizations for behavioral issues (1980s, 2000s)  . Aorto-iliac atherosclerosis (HCC)   . Arthritis   . Cataract   . COPD (chronic obstructive pulmonary disease) (HCC)   . DDD (degenerative disc disease), lumbar    mild with dextroscoliosis  . Depression   . Emphysema of lung (HCC)    ?asthmatic bronchitis per prior PCP  . GERD (gastroesophageal reflux disease)   . Hepatitis C   . History of stomach ulcers   . Hyperlipidemia   . Hypertension   . Macular degeneration   . MDD (major depressive disorder), recurrent episode, moderate (HCC)   . Polysubstance (including opioids) dependence, daily use (HCC) 10/2015   narcotics, heroin  . 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 Problems related to release from prison 07/2014  . Psoriasis 01/08/2015  . Type 2  diabetes, uncontrolled, with neuropathy (HCC)   . Urine incontinence     Patient Active Problem List   Diagnosis Date Noted  . Medication refill 08/30/2018  . Spinal stenosis of lumbar region 08/30/2018  . Homelessness 07/28/2018  . Age-related nuclear cataract of right eye 03/12/2016  . Multiple trauma to chest 08/23/2015  . Skin rash 07/28/2015  . Erectile dysfunction 06/13/2015  . Cataract 06/13/2015  . Aorto-iliac atherosclerosis (HCC)   . Smoker 01/26/2015  . Alcohol use 01/26/2015  . MDD (major depressive disorder), recurrent episode, moderate (HCC)   . Psoriasis 01/08/2015  . Macular degeneration   . Chronic low back pain 12/26/2014  . Emphysema of lung (HCC)   . Chronic hepatitis C without hepatic coma (HCC)   . Anxiety disorder due to general medical condition with panic attack   . GERD (gastroesophageal reflux disease)   . Hyperlipidemia   . Controlled diabetes mellitus with diabetic neuropathy (HCC) 09/16/2014  . Essential hypertension 09/16/2014  . BPH (benign prostatic hyperplasia) 09/16/2014    Past Surgical History:  Procedure Laterality Date  . STRABISMUS SURGERY     left eye vision loss, multiple surgeries  . TONSILLECTOMY         Home Medications    Prior to Admission medications   Medication  Sig Start Date End Date Taking? Authorizing Provider  acetaminophen (TYLENOL) 500 MG tablet Take 1 tablet (500 mg total) by mouth every 6 (six) hours as needed for mild pain, moderate pain, fever or headache. 10/09/18   Georgetta HaberBurky,  B, NP  albuterol (VENTOLIN HFA) 108 (90 Base) MCG/ACT inhaler Inhale 2 puffs into the lungs every 6 (six) hours as needed for wheezing or shortness of breath. 10/09/18   Georgetta HaberBurky,  B, NP  amitriptyline (ELAVIL) 100 MG tablet Take 1 tablet (100 mg total) by mouth at bedtime as needed for sleep. 10/09/18   Georgetta HaberBurky,  B, NP  atenolol (TENORMIN) 25 MG tablet Take 1 tablet (25 mg total) by mouth daily. 10/09/18   Georgetta HaberBurky,  B, NP   budesonide-formoterol (SYMBICORT) 160-4.5 MCG/ACT inhaler Inhale 2 puffs into the lungs 2 (two) times daily. 10/09/18 10/09/19  Georgetta HaberBurky,  B, NP  cariprazine (VRAYLAR) capsule Take 1 capsule (3 mg total) by mouth daily for 14 days. 10/09/18 10/23/18  Georgetta HaberBurky,  B, NP  gabapentin (NEURONTIN) 800 MG tablet Take 1 tablet (800 mg total) by mouth 4 (four) times daily for 14 days. 10/09/18 10/23/18  Georgetta HaberBurky,  B, NP  Glucose Blood (BLOOD GLUCOSE TEST STRIPS) STRP Use as directed 04/28/15   Eustaquio BoydenGutierrez, Javier, MD  ibuprofen (ADVIL) 400 MG tablet Take 1 tablet (400 mg total) by mouth every 8 (eight) hours as needed. 07/06/18   Melene PlanFloyd, Dan, DO  lisinopril (ZESTRIL) 10 MG tablet Take 1 tablet (10 mg total) by mouth daily. 10/09/18 11/08/18  Georgetta HaberBurky,  B, NP  omeprazole (PRILOSEC) 20 MG capsule Take 1 capsule (20 mg total) by mouth 2 (two) times daily before a meal for 14 days. 10/09/18 10/23/18  Linus MakoBurky,  B, NP  salicylic acid-lactic acid 17 % external solution Apply topically daily. 07/06/18   Melene PlanFloyd, Dan, DO  tamsulosin (FLOMAX) 0.4 MG CAPS capsule Take 1 capsule by mouth twice a day 10/09/18   Georgetta HaberBurky,  B, NP    Family History Family History  Problem Relation Age of Onset  . Diabetes Father   . Heart failure Father   . CAD Father   . Stroke Mother   . Hypertension Mother   . Mental illness Mother   . Depression Mother   . Irritable bowel syndrome Mother   . Bipolar disorder Brother        ?  Marland Kitchen. CAD Paternal Grandfather   . Stroke Maternal Grandfather     Social History Social History   Tobacco Use  . Smoking status: Current Every Day Smoker    Packs/day: 1.00    Years: 29.00    Pack years: 29.00    Types: Cigarettes    Start date: 03/18/1973  . Smokeless tobacco: Former NeurosurgeonUser    Types: Snuff  . Tobacco comment: Has tried dip in the past some, quit for 7 year   Substance Use Topics  . Alcohol use: Yes    Alcohol/week: 6.0 standard drinks    Types: 6 Cans of beer per week     Comment: occasional, not regular  . Drug use: No    Types: Marijuana, IV, Heroin, Oxycodone, Hydrocodone    Comment: 09/25/15 last use     Allergies   Penicillins and Guaifenesin & derivatives   Review of Systems Review of Systems   Physical Exam Triage Vital Signs ED Triage Vitals  Enc Vitals Group     BP 10/09/18 1409 (!) 145/85     Pulse Rate 10/09/18 1409 80  Resp 10/09/18 1409 17     Temp 10/09/18 1409 98.2 F (36.8 C)     Temp Source 10/09/18 1409 Oral     SpO2 10/09/18 1409 98 %     Weight --      Height --      Head Circumference --      Peak Flow --      Pain Score 10/09/18 1410 9     Pain Loc --      Pain Edu? --      Excl. in GC? --    No data found.  Updated Vital Signs BP (!) 145/85 (BP Location: Right Arm)   Pulse 80   Temp 98.2 F (36.8 C) (Oral)   Resp 17   SpO2 98%   Visual Acuity  Physical Exam Constitutional:      Appearance: He is well-developed.  Cardiovascular:     Rate and Rhythm: Normal rate.  Pulmonary:     Effort: Pulmonary effort is normal.  Skin:    General: Skin is warm and dry.  Neurological:     Mental Status: He is alert and oriented to person, place, and time.      UC Treatments / Results  Labs (all labs ordered are listed, but only abnormal results are displayed) Labs Reviewed - No data to display  EKG   Radiology No results found.  Procedures Procedures (including critical care time)  Medications Ordered in UC Medications - No data to display  Initial Impression / Assessment and Plan / UC Course  I have reviewed the triage vital signs and the nursing notes.  Pertinent labs & imaging results that were available during my care of the patient were reviewed by me and considered in my medical decision making (see chart for details).     14 days of refills provided, as patient has not yet ran out, has been given previous refills with recommendations to establish with a PCP. No new complaints today. Is  following with ortho for back pain. Information provided for primary care providers. Patient verbalized understanding and agreeable to plan.  Ambulatory out of clinic without difficulty.    Final Clinical Impressions(s) / UC Diagnoses   Final diagnoses:  Encounter for medication refill  Chronic low back pain, unspecified back pain laterality, unspecified whether sciatica present  Hypertension, unspecified type     Discharge Instructions     Please call today to establish with a primary care provider.  I have provided you with 14 days of additional medications as we do not manage chronic medications here from urgent care.    ED Prescriptions    Medication Sig Dispense Auth. Provider   albuterol (VENTOLIN HFA) 108 (90 Base) MCG/ACT inhaler Inhale 2 puffs into the lungs every 6 (six) hours as needed for wheezing or shortness of breath. 8 g Linus MakoBurky,  B, NP   acetaminophen (TYLENOL) 500 MG tablet Take 1 tablet (500 mg total) by mouth every 6 (six) hours as needed for mild pain, moderate pain, fever or headache. 60 tablet Linus MakoBurky,  B, NP   amitriptyline (ELAVIL) 100 MG tablet Take 1 tablet (100 mg total) by mouth at bedtime as needed for sleep. 14 tablet Linus MakoBurky,  B, NP   atenolol (TENORMIN) 25 MG tablet Take 1 tablet (25 mg total) by mouth daily. 14 tablet Linus MakoBurky,  B, NP   budesonide-formoterol (SYMBICORT) 160-4.5 MCG/ACT inhaler Inhale 2 puffs into the lungs 2 (two) times daily. 1 Inhaler Georgetta HaberBurky,  B, NP  gabapentin (NEURONTIN) 800 MG tablet Take 1 tablet (800 mg total) by mouth 4 (four) times daily for 14 days. 56 tablet Augusto Gamble B, NP   lisinopril (ZESTRIL) 10 MG tablet Take 1 tablet (10 mg total) by mouth daily. 30 tablet Augusto Gamble B, NP   omeprazole (PRILOSEC) 20 MG capsule Take 1 capsule (20 mg total) by mouth 2 (two) times daily before a meal for 14 days. 28 capsule Augusto Gamble B, NP   tamsulosin (FLOMAX) 0.4 MG CAPS capsule Take 1 capsule by  mouth twice a day 30 capsule ,  B, NP   cariprazine (VRAYLAR) capsule Take 1 capsule (3 mg total) by mouth daily for 14 days. 14 capsule Zigmund Gottron, NP     Controlled Substance Prescriptions [place] Controlled Substance Registry consulted? Not Applicable   Zigmund Gottron, NP 10/09/18 [phone]

## 2018-10-13 ENCOUNTER — Telehealth: Payer: Self-pay | Admitting: *Deleted

## 2018-10-13 NOTE — Telephone Encounter (Signed)
If Dr. Lorin Mercy is requesting clearance, their office needs to send me something.  Otherwise he should come in for treatment of his hepatitis C.

## 2018-10-13 NOTE — Telephone Encounter (Signed)
Per [place] response called the patient to advised him to have Dr Lorin Mercy office fax forms if they need a request and let him know if he wants to be treated for Hep C we can have a visit for follow up. Patient does want to follow up for Hep B so gave him an office visit.

## 2018-10-13 NOTE — Telephone Encounter (Signed)
Patient called and advise he is scheduled to have back surgery and wants to know if he Dr Linus Salmons can write a letter saying he is ok to have the procedure. Advised patient not sure this will happen but can ask the provider. Reminded him he missed his follow up visit and he stated if he needs to make an visit he will do so. His surgery is in September some time no date yet.

## 2018-11-06 ENCOUNTER — Telehealth: Payer: Self-pay | Admitting: General Practice

## 2018-11-06 NOTE — Telephone Encounter (Signed)
1) Medication(s) Requested (by name): tamsulosin (FLOMAX) 0.4 MG CAPS capsule [[id number]]  lisinopril (ZESTRIL) 10 MG tablet [[id number]]    2) Pharmacy of Choice:  3) Special Requests:   Approved medications will be sent to the pharmacy, we will reach out if there is an issue.  Requests made after 3pm may not be addressed until the following business day!  If a patient is unsure of the name of the medication(s) please note and ask patient to call back when they are able to provide all info, do not send to responsible party until all information is available!

## 2018-11-18 ENCOUNTER — Ambulatory Visit: Payer: Medicaid Other | Admitting: Internal Medicine

## 2018-11-25 ENCOUNTER — Encounter: Payer: Self-pay | Admitting: Internal Medicine

## 2018-11-25 ENCOUNTER — Other Ambulatory Visit: Payer: Self-pay

## 2018-11-25 ENCOUNTER — Ambulatory Visit (INDEPENDENT_AMBULATORY_CARE_PROVIDER_SITE_OTHER): Payer: Medicaid Other | Admitting: Internal Medicine

## 2018-11-25 VITALS — BP 147/94 | HR 68

## 2018-11-25 DIAGNOSIS — Z7289 Other problems related to lifestyle: Secondary | ICD-10-CM

## 2018-11-25 DIAGNOSIS — G8929 Other chronic pain: Secondary | ICD-10-CM

## 2018-11-25 DIAGNOSIS — B182 Chronic viral hepatitis C: Secondary | ICD-10-CM

## 2018-11-25 DIAGNOSIS — M545 Low back pain, unspecified: Secondary | ICD-10-CM

## 2018-11-25 DIAGNOSIS — Z789 Other specified health status: Secondary | ICD-10-CM

## 2018-11-25 MED ORDER — MAVYRET 100-40 MG PO TABS: 3.0000 | ORAL_TABLET | Freq: Every day | ORAL | 2 refills | Status: DC

## 2018-11-25 NOTE — Assessment & Plan Note (Signed)
Has had very low levels but has remained positive so will treat.  Has known genotype 1a.  Will use [facility] since he has medicaid.   Will also check elastography.  Previous Fibrosure F3.

## 2018-11-25 NOTE — Assessment & Plan Note (Signed)
Has remained drug and alcohol free.  Doing well.

## 2018-11-25 NOTE — Progress Notes (Signed)
   Subjective:    Patient ID: Randy Barnes, male    DOB: [birth year], 58 y.o.   MRN: [id number]  HPI Here for follow up of HCV I saw him initially in 2017 but did not return. 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  It has though been persisitently positive.  Has had it for over 20 years.  Never treated.  Hepatitis A and B immune. He last used drugs over 6 months ago and has been in counseling, though no transportation at this time and unable to go due to [place].    Review of Systems  Constitutional: Negative for fatigue.  Gastrointestinal: Negative for diarrhea.  Skin: Negative for rash.       Objective:   Physical Exam Constitutional:      Appearance: Normal appearance.  Eyes:     General: No scleral icterus. Skin:    Findings: No rash.  Neurological:     General: No focal deficit present.     Mental Status: He is alert.  Psychiatric:        Mood and Affect: Mood normal.   SH: no drugs or alcohol        Assessment & Plan:

## 2018-11-25 NOTE — Assessment & Plan Note (Signed)
Considering back surgery with Dr. Lorin Mercy.

## 2018-11-27 ENCOUNTER — Telehealth: Payer: Self-pay | Admitting: Pharmacy Technician

## 2018-11-27 NOTE — Telephone Encounter (Signed)
[facility] Patient Advocate Encounter    Insurance: [employer] active Estimated copay: $3.00 Prior Authorization: will send once labs return, Readiness form is signed  [facility] Patient Advocate will follow up once we receive notification of approval.  Bartholomew Crews, [place] [facility] Phone: [phone] Fax: [phone] 11/27/2018 12:53 PM

## 2018-11-30 ENCOUNTER — Ambulatory Visit (HOSPITAL_COMMUNITY): Payer: Medicaid Other

## 2018-12-02 ENCOUNTER — Telehealth: Payer: Self-pay | Admitting: Radiology

## 2018-12-02 ENCOUNTER — Other Ambulatory Visit: Payer: Self-pay

## 2018-12-02 ENCOUNTER — Ambulatory Visit (INDEPENDENT_AMBULATORY_CARE_PROVIDER_SITE_OTHER): Payer: Medicaid Other | Admitting: Orthopaedic Surgery

## 2018-12-02 ENCOUNTER — Encounter: Payer: Self-pay | Admitting: Orthopaedic Surgery

## 2018-12-02 VITALS — BP 129/84 | HR 73 | Ht 75.0 in | Wt 230.0 lb

## 2018-12-02 DIAGNOSIS — M48062 Spinal stenosis, lumbar region with neurogenic claudication: Secondary | ICD-10-CM
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
## 2018-12-02 NOTE — Telephone Encounter (Signed)
Patient forgot to ask for rx when he was here. He would like to get a rolling walker with seat if possible because he cannot stand for long periods of time. Please advise.

## 2018-12-02 NOTE — Telephone Encounter (Signed)
Ok to do. Rolling walker with reverse seat. Dx  Lumbar spinal stenosis. thanks

## 2018-12-03 ENCOUNTER — Other Ambulatory Visit: Payer: Self-pay

## 2018-12-03 ENCOUNTER — Ambulatory Visit (INDEPENDENT_AMBULATORY_CARE_PROVIDER_SITE_OTHER): Payer: Medicaid Other | Admitting: Psychiatry

## 2018-12-03 DIAGNOSIS — R4586 Emotional lability: Secondary | ICD-10-CM | POA: Diagnosis not present

## 2018-12-03 DIAGNOSIS — F12188 Cannabis abuse with other cannabis-induced disorder: Secondary | ICD-10-CM | POA: Diagnosis not present

## 2018-12-03 DIAGNOSIS — F319 Bipolar disorder, unspecified: Secondary | ICD-10-CM

## 2018-12-03 DIAGNOSIS — Z789 Other specified health status: Secondary | ICD-10-CM

## 2018-12-03 DIAGNOSIS — F1210 Cannabis abuse, uncomplicated: Secondary | ICD-10-CM

## 2018-12-03 DIAGNOSIS — F419 Anxiety disorder, unspecified: Secondary | ICD-10-CM | POA: Diagnosis not present

## 2018-12-03 DIAGNOSIS — Z7289 Other problems related to lifestyle: Secondary | ICD-10-CM
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
## 2018-12-03 NOTE — Telephone Encounter (Signed)
rx written and placed up front for patient to pick up.  I called patient and left voicemail advising.

## 2018-12-04 ENCOUNTER — Ambulatory Visit (HOSPITAL_BASED_OUTPATIENT_CLINIC_OR_DEPARTMENT_OTHER): Payer: Medicaid Other | Admitting: Family Medicine

## 2018-12-04 ENCOUNTER — Other Ambulatory Visit: Payer: Self-pay

## 2018-12-04 ENCOUNTER — Encounter: Payer: Self-pay | Admitting: Family Medicine

## 2018-12-04 ENCOUNTER — Ambulatory Visit (HOSPITAL_COMMUNITY)
Admission: RE | Admit: 2018-12-04 | Discharge: 2018-12-04 | Disposition: A | Discharge status: Home / Self Care | Payer: Medicaid Other | Source: Ambulatory Visit | Attending: Internal Medicine | Admitting: Internal Medicine

## 2018-12-04 DIAGNOSIS — E1140 Type 2 diabetes mellitus with diabetic neuropathy, unspecified: Secondary | ICD-10-CM | POA: Diagnosis not present

## 2018-12-04 DIAGNOSIS — J449 Chronic obstructive pulmonary disease, unspecified: Secondary | ICD-10-CM | POA: Diagnosis not present

## 2018-12-04 DIAGNOSIS — I10 Essential (primary) hypertension: Secondary | ICD-10-CM | POA: Diagnosis not present

## 2018-12-04 DIAGNOSIS — M5441 Lumbago with sciatica, right side: Secondary | ICD-10-CM

## 2018-12-04 DIAGNOSIS — B182 Chronic viral hepatitis C: Secondary | ICD-10-CM | POA: Diagnosis not present

## 2018-12-04 DIAGNOSIS — G8929 Other chronic pain: Secondary | ICD-10-CM

## 2018-12-04 DIAGNOSIS — J4489 Other specified chronic obstructive pulmonary disease: Secondary | ICD-10-CM

## 2018-12-04 DIAGNOSIS — M5442 Lumbago with sciatica, left side: Secondary | ICD-10-CM

## 2018-12-04 MED ORDER — ALBUTEROL SULFATE HFA 108 (90 BASE) MCG/ACT IN AERS: 2.0000 | INHALATION_SPRAY | Freq: Four times a day (QID) | RESPIRATORY_TRACT | 0 refills | Status: DC | PRN

## 2018-12-04 MED ORDER — TAMSULOSIN HCL 0.4 MG PO CAPS: ORAL_CAPSULE | ORAL | 1 refills | Status: DC

## 2018-12-04 MED ORDER — OMEPRAZOLE 20 MG PO CPDR: 20.0000 mg | DELAYED_RELEASE_CAPSULE | Freq: Two times a day (BID) | ORAL | 1 refills | Status: DC

## 2018-12-04 MED ORDER — BUDESONIDE-FORMOTEROL FUMARATE 160-4.5 MCG/ACT IN AERO: 2.0000 | INHALATION_SPRAY | Freq: Two times a day (BID) | RESPIRATORY_TRACT | 1 refills | Status: DC

## 2018-12-04 MED ORDER — LISINOPRIL 10 MG PO TABS: 10.0000 mg | ORAL_TABLET | Freq: Every day | ORAL | 1 refills | Status: DC

## 2018-12-04 MED ORDER — GABAPENTIN 800 MG PO TABS: 800.0000 mg | ORAL_TABLET | Freq: Four times a day (QID) | ORAL | 1 refills | Status: DC

## 2018-12-04 MED ORDER — AMITRIPTYLINE HCL 100 MG PO TABS: 100.0000 mg | ORAL_TABLET | Freq: Every evening | ORAL | 1 refills | Status: DC | PRN

## 2018-12-04 MED ORDER — ATENOLOL 25 MG PO TABS: 25.0000 mg | ORAL_TABLET | Freq: Every day | ORAL | 1 refills | Status: DC

## 2018-12-04 NOTE — Progress Notes (Signed)
Virtual Visit via Telephone Note  I connected with Randy Barnes on 12/04/18 at  2:50 PM EDT by telephone and verified that I am speaking with the correct person using two identifiers.   I discussed the limitations, risks, security and privacy concerns of performing an evaluation and management service by telephone and the availability of in person appointments. I also discussed with the patient that there may be a patient responsible charge related to this service. The patient expressed understanding and agreed to proceed.   Patient Location: Randy Barnes Provider Location: [facility] Others participating in call: none   History of Present Illness:      58 yo male who states that he needs refill of his medications for continued treatment of his chronic medical issues, He is currently staying with a friend in [place] as he is having issues with housing.  He states that his blood sugars are remaining well controlled and he is not currently taking any medication for his diabetes.  When he has checked his blood sugars they are staying under 120 fasting.  He denies any urinary frequency and no increased thirst related to his diabetes.  He continues to have issues with numbness in his feet and needs refill of gabapentin.  Patient also continues to have bilateral low back pain with radiation.  He states that this morning when he was at [employer] to get breakfast, he had a near fall after feeling as if his leg suddenly became weak.  He does have an appointment next week with his orthopedic doctor to have injections to help with his chronic back pain.  He also needs refill of amitriptyline which he takes at bedtime to help with sleep and the burning/stinging pain in his legs and feet.  Pain in his back and legs is generally between a 7-8 on a 0-to-10 scale.       He continues to take his blood pressure medication and denies headaches or dizziness related to his blood pressure.  When he has checked his  blood pressure, it is generally less than 140 for the top number and under 80 for the bottom number.  He continues to take omeprazole and this is controlled his acid reflux symptoms.  He needs refill of tamsulosin as this is helped with his urinary flow.  He denies any current issues with urination.       He continues to smoke and does not believe that he can stop at this time as he feels that smoking also helps with his anxiety.  He continues to be followed by psychiatry.  He feels that his COPD is stable as long as he uses his inhalers.  He is currently out of his inhalers and has had some increase in shortness of breath.  He denies any cough.  He does have some mild fatigue.  He reports that he did see his infectious disease doctor today in follow-up of hepatitis C and he had blood work and an ultrasound of his liver.  He reports that he is scheduled to start [place] in the upcoming weeks for treatment of hepatitis C.  Past Medical History:  Diagnosis Date  . Anxiety disorder due to general medical condition with panic attack    h/o hospitalizations for behavioral issues (1980s, 2000s)  . Aorto-iliac atherosclerosis ([place])   . Arthritis   . Cataract   . COPD (chronic obstructive pulmonary disease) ([place])   . DDD (degenerative disc disease), lumbar    mild with dextroscoliosis  .  Depression   . Emphysema of lung ([facility])    ?asthmatic bronchitis per prior PCP  . GERD (gastroesophageal reflux disease)   . Hepatitis C   . History of stomach ulcers   . Hyperlipidemia   . Hypertension   . Macular degeneration   . MDD (major depressive disorder), recurrent episode, moderate ([facility])   . Polysubstance (including opioids) dependence, daily use ([facility]) 10/2015   narcotics, heroin  . 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 Problems related to release from prison 07/2014  . Psoriasis 01/08/2015  . Type 2 diabetes, uncontrolled, with neuropathy ([facility])   . Urine  incontinence     Past Surgical History:  Procedure Laterality Date  . STRABISMUS SURGERY     left eye vision loss, multiple surgeries  . TONSILLECTOMY      Family History  Problem Relation Age of Onset  . Diabetes Father   . Heart failure Father   . CAD Father   . Stroke Mother   . Hypertension Mother   . Mental illness Mother   . Depression Mother   . Irritable bowel syndrome Mother   . Bipolar disorder Brother        ?  Marland Kitchen CAD Paternal Grandfather   . Stroke Maternal Grandfather     Social History   Tobacco Use  . Smoking status: Current Every Day Smoker    Packs/day: 1.00    Years: 29.00    Pack years: 29.00    Types: Cigarettes    Start date: 03/18/1973  . Smokeless tobacco: Former Neurosurgeon    Types: Snuff  . Tobacco comment: Has tried dip in the past some, quit for 7 year   Substance Use Topics  . Alcohol use: Yes    Alcohol/week: 6.0 standard drinks    Types: 6 Cans of beer per week    Comment: occasional, not regular  . Drug use: No    Types: Marijuana, IV, Heroin, Oxycodone, Hydrocodone    Comment: 09/25/15 last use      Observations/Objective: No vital signs or physical exam conducted as visit was done via telephone  Assessment and Plan: 1. Controlled type 2 diabetes mellitus with diabetic neuropathy, without long-term current use of insulin ([facility]) Currently diet controlled and per patient his blood sugars continue to do well.  He reports that he will be able to come into the office to have hemoglobin A1c done when he comes to follow-up with orthopedics.  Continue low carbohydrate diet and regular monitoring of blood sugars.  2. COPD with chronic bronchitis ([facility]) Out of his inhalers but controlled with regular use of inhalers.  Refills of inhalers provided.  He is encouraged to try to work on decreasing number of cigarettes smoked with eventual cessation of cigarettes and he is encouraged to have his influenza immunization  3. Essential hypertension Controlled  on Lisinopril and atenolol.  Refills provided continue low-sodium diet.  4. Chronic hepatitis C without hepatic coma ([facility]) Has liver US this am per ID and scheduled to start [place].  No current issues with abdominal pain.  He reported that he did have some blood work done today but results are not yet available.  5. Chronic low back pain with bilateral sciatica Scheduled for injections with Dr. Jenne Pane, had episode of legs suddenly giving away causing a near fall earlier today.  Refills provided of gabapentin and amitriptyline to help with neuropathic pain from lumbar radiculopathy and  diabetic neuropathy  Follow Up Instructions:Return in about 4 months (around 04/05/2019) for DM/COPD with PCP.    I discussed the assessment and treatment plan with the patient. The patient was provided an opportunity to ask questions and all were answered. The patient agreed with the plan and demonstrated an understanding of the instructions.   The patient was advised to call back or seek an in-person evaluation if the symptoms worsen or if the condition fails to improve as anticipated.  I provided 14 minutes of non-face-to-face time during this encounter.   Cain Saupeammie Mishael Haran, MD

## 2018-12-05 ENCOUNTER — Encounter: Payer: Self-pay | Admitting: Family Medicine

## 2018-12-07 ENCOUNTER — Other Ambulatory Visit: Payer: Self-pay | Admitting: Pharmacist

## 2018-12-07 DIAGNOSIS — B182 Chronic viral hepatitis C: Secondary | ICD-10-CM

## 2018-12-07 MED ORDER — MAVYRET 100-40 MG PO TABS: 3.0000 | ORAL_TABLET | Freq: Every day | ORAL | 1 refills | Status: DC

## 2018-12-07 NOTE — Telephone Encounter (Signed)
[facility] Patient Advocate Encounter   Received notification from [place] that prior authorization for [place] is required.   PA submitted on 09/21 [place] [id number] W Status is pending    [facility] will continue to follow.  Bartholomew Crews, CPhT Specialty Pharmacy Patient [facility] Phone: [phone] Fax: [phone] 12/07/2018 11:48 AM

## 2018-12-07 NOTE — Progress Notes (Signed)
Resending Mavyret Rx for 8 weeks instead of 12 weeks and to say to take with food.

## 2018-12-07 NOTE — Telephone Encounter (Signed)
[facility] Patient Advocate Encounter  Prior Authorization for Mavyret has been approved.    Effective dates: 12/07/2018 through 02/05/2019  Patients co-pay is $3.00   [facility] will continue to follow-up with the patient to go over with them the services at [facility] and whether he wants the medication shipped or picked up.  Bartholomew Crews, CPhT Specialty Pharmacy Patient [facility] Phone: [phone] Fax: [phone] 12/07/2018 3:24 PM

## 2018-12-08 ENCOUNTER — Encounter: Payer: Self-pay | Admitting: Pharmacy Technician

## 2018-12-08 ENCOUNTER — Telehealth: Payer: Self-pay | Admitting: Pharmacist

## 2018-12-08 NOTE — Telephone Encounter (Signed)
[facility] Patient Advocate Encounter  Patient will have his medication ordered and shipped from [facility] 09/23 to arrive 09/24. He is currently residing at: [address], [place], [place] [address] He will begin taking the medication once he receives in the mail.

## 2018-12-08 NOTE — Telephone Encounter (Signed)
Patient is approved to receive Mavyret x 8 weeks for chronic Hepatitis C infection. Counseled patient to take all three tablets of Mavyret daily with food.  Counseled patient the need to take all three tablets together and to not separate them out during the day. Encouraged patient not to miss any doses and explained how their chance of cure could go down with each dose missed. Counseled patient on what to do if dose is missed - if it is closer to the missed dose take immediately; if closer to next dose then skip dose and take the next dose at the usual time.   Counseled patient on common side effects such as headache, fatigue, and nausea and that these normally decrease with time. I reviewed patient medications and found no drug interactions. Discussed with patient that there are several drug interactions with Mavyret and instructed patient to call the clinic if he wishes to start a new medication during course of therapy. Also advised patient to call if he experiences any side effects. Patient will follow-up with me in the pharmacy clinic on 10/26.

## 2018-12-09 MED FILL — MAVYRET 100-40 MG TABS: 100-40 | 28 days supply | Qty: 84 | Fill #0

## 2018-12-10 ENCOUNTER — Telehealth (HOSPITAL_COMMUNITY): Payer: Self-pay

## 2018-12-10 NOTE — Telephone Encounter (Signed)
Please read my note.  He like to continue 3 mg even he complained about itching with a higher dose.  And he mentioned that he had plenty of samples for another 6 months and he does not need new prescription.  Please confirm if he has enough samples of 3 mg as he like to continue 3 mg.

## 2018-12-10 NOTE — Telephone Encounter (Signed)
Patient is calling about his medication, it was not at the pharmacy. When I looked at the medication list there is nothing there from you. Patient states that he is on Vraylar and that you agreed to lower the dose to 1.5 mg since he has a tick. When I look at your note, it says continue 3 mg. Please clarify the dose and I can submit the prescription, thank you

## 2018-12-21 ENCOUNTER — Ambulatory Visit (INDEPENDENT_AMBULATORY_CARE_PROVIDER_SITE_OTHER): Payer: Medicaid Other | Admitting: Physical Medicine and Rehabilitation

## 2018-12-21 ENCOUNTER — Encounter: Payer: Self-pay | Admitting: Physical Medicine and Rehabilitation

## 2018-12-21 ENCOUNTER — Ambulatory Visit: Payer: Self-pay

## 2018-12-21 VITALS — BP 137/81 | HR 60

## 2018-12-21 DIAGNOSIS — M5416 Radiculopathy, lumbar region: Secondary | ICD-10-CM | POA: Diagnosis not present

## 2018-12-21 DIAGNOSIS — M48062 Spinal stenosis, lumbar region with neurogenic claudication: Secondary | ICD-10-CM

## 2018-12-21 MED ORDER — BETAMETHASONE SOD PHOS & ACET 6 (3-3) MG/ML IJ SUSP
12.0000 mg | Freq: Once | INTRAMUSCULAR | Status: AC
  Administered 2018-12-21: 12 mg

## 2018-12-21 NOTE — Progress Notes (Signed)
 .  Numeric Pain Rating Scale and Functional Assessment Average Pain 8   In the last MONTH (on 0-10 scale) has pain interfered with the following?  1. General activity like being  able to carry out your everyday physical activities such as walking, climbing stairs, carrying groceries, or moving a chair?  Rating(8)   +Driver, -BT, -Dye Allergies.  

## 2019-01-04 MED FILL — MAVYRET 100-40 MG TABS: 100-40 | 28 days supply | Qty: 84 | Fill #1

## 2019-01-11 ENCOUNTER — Ambulatory Visit (INDEPENDENT_AMBULATORY_CARE_PROVIDER_SITE_OTHER): Payer: Medicaid Other | Admitting: Pharmacist

## 2019-01-11 ENCOUNTER — Ambulatory Visit: Payer: Medicaid Other | Attending: Family Medicine

## 2019-01-11 ENCOUNTER — Other Ambulatory Visit: Payer: Self-pay

## 2019-01-11 DIAGNOSIS — B182 Chronic viral hepatitis C: Secondary | ICD-10-CM | POA: Diagnosis not present

## 2019-01-11 DIAGNOSIS — E1140 Type 2 diabetes mellitus with diabetic neuropathy, unspecified: Secondary | ICD-10-CM
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
## 2019-01-12 LAB — MICROALBUMIN / CREATININE URINE RATIO
Creatinine, Urine: 170 mg/dL
Microalb/Creat Ratio: 11 mg/g{creat} (ref 0–29)
Microalbumin, Urine: 19.4 ug/mL

## 2019-01-12 LAB — HEMOGLOBIN A1C
Est. average glucose Bld gHb Est-mCnc: 108 mg/dL
Hgb A1c MFr Bld: 5.4 % (ref 4.8–5.6)

## 2019-01-12 LAB — BASIC METABOLIC PANEL WITH GFR
BUN/Creatinine Ratio: 18 (ref 9–20)
BUN: 17 mg/dL (ref 6–24)
CO2: 24 mmol/L (ref 20–29)
Calcium: 9.7 mg/dL (ref 8.7–10.2)
Chloride: 103 mmol/L (ref 96–106)
Creatinine, Ser: 0.95 mg/dL (ref 0.76–1.27)
GFR calc Af Amer: 103 mL/min/1.73
GFR calc non Af Amer: 89 mL/min/1.73
Glucose: 123 mg/dL — ABNORMAL HIGH (ref 65–99)
Potassium: 4.7 mmol/L (ref 3.5–5.2)
Sodium: 141 mmol/L (ref 134–144)

## 2019-01-14 LAB — COMPREHENSIVE METABOLIC PANEL
AG Ratio: 1.7 (calc) (ref 1.0–2.5)
ALT: 14 U/L (ref 9–46)
AST: 19 U/L (ref 10–35)
Albumin: 4.3 g/dL (ref 3.6–5.1)
Alkaline phosphatase (APISO): 65 U/L (ref 35–144)
BUN: 19 mg/dL (ref 7–25)
CO2: 25 mmol/L (ref 20–32)
Calcium: 9.4 mg/dL (ref 8.6–10.3)
Chloride: 104 mmol/L (ref 98–110)
Creat: 1.12 mg/dL (ref 0.70–1.33)
Globulin: 2.5 g/dL (calc) (ref 1.9–3.7)
Glucose, Bld: 136 mg/dL — ABNORMAL HIGH (ref 65–99)
Potassium: 4.3 mmol/L (ref 3.5–5.3)
Sodium: 138 mmol/L (ref 135–146)
Total Bilirubin: 1.1 mg/dL (ref 0.2–1.2)
Total Protein: 6.8 g/dL (ref 6.1–8.1)

## 2019-01-14 LAB — HEPATITIS C RNA QUANTITATIVE
HCV Quantitative Log: <1.18 Log IU/mL
HCV RNA, PCR, QN: <15 IU/mL

## 2019-01-25 DIAGNOSIS — M5416 Radiculopathy, lumbar region: Secondary | ICD-10-CM | POA: Insufficient documentation

## 2019-01-25 NOTE — Procedures (Signed)
Lumbosacral Transforaminal Epidural Steroid Injection - Sub-Pedicular Approach with Fluoroscopic Guidance  Patient: Randy Barnes      Date of Birth: December 29, 1960 MRN: [id number] PCP: Patient, No Pcp Per      Visit Date: 12/21/2018   Universal Protocol:    Date/Time: 12/21/2018  Consent Given By: the patient  Position: PRONE  Additional Comments: Vital signs were monitored before and after the procedure. Patient was prepped and draped in the usual sterile fashion. The correct patient, procedure, and site was verified.   Injection Procedure Details:  Procedure Site One Meds Administered:  Meds ordered this encounter  Medications  . betamethasone acetate-betamethasone sodium phosphate (CELESTONE) injection 12 mg    Laterality: Right  Location/Site: Patient has transitional S1 segment. L4-L5  Needle size: 22 G  Needle type: Spinal  Needle Placement: Transforaminal  Findings:    -Comments: Excellent flow of contrast along the nerve and into the epidural space.  Procedure Details: After squaring off the end-plates to get a true AP view, the C-arm was positioned so that an oblique view of the foramen as noted above was visualized. The target area is just inferior to the "nose of the scotty dog" or sub pedicular. The soft tissues overlying this structure were infiltrated with 2-3 ml. of 1% Lidocaine without Epinephrine.  The spinal needle was inserted toward the target using a "trajectory" view along the fluoroscope beam.  Under AP and lateral visualization, the needle was advanced so it did not puncture dura and was located close the 6 O'Clock position of the pedical in AP tracterory. Biplanar projections were used to confirm position. Aspiration was confirmed to be negative for CSF and/or blood. A 1-2 ml. volume of Isovue-250 was injected and flow of contrast was noted at each level. Radiographs were obtained for documentation purposes.   After attaining the desired flow of  contrast documented above, a 0.5 to 1.0 ml test dose of 0.25% Marcaine was injected into each respective transforaminal space.  The patient was observed for 90 seconds post injection.  After no sensory deficits were reported, and normal lower extremity motor function was noted,   the above injectate was administered so that equal amounts of the injectate were placed at each foramen (level) into the transforaminal epidural space.   Additional Comments:  The patient tolerated the procedure well Dressing: 2 x 2 sterile gauze and Band-Aid    Post-procedure details: Patient was observed during the procedure. Post-procedure instructions were reviewed.  Patient left the clinic in stable condition.

## 2019-01-25 NOTE — Progress Notes (Signed)
Randy Barnes - 58 y.o. male MRN [id number]  Date of birth: March 28, 1960  Office Visit Note: Visit Date: 12/21/2018 PCP: Patient, No Pcp Per Referred by: Marybelle Killings, MD  Subjective: Chief Complaint  Patient presents with  . Lower Back - Pain  . Right Leg - Pain   HPI:  [name] is a 58 y.o. male who comes in today At the request of Dr. Rodell Perna for right L4 transforaminal epidural steroid injection.  Patient has S1 transitional segment and last injection was an L5 injection.  He reports pain in the lower back radiating down the right leg down to the foot.  This has been going on for many years.  Worsening with walking and standing no relief with medications and current treatment.  MRI reviewed below.  Severe spinal stenosis at L4-5.  ROS Otherwise per HPI.  Assessment & Plan: Visit Diagnoses:  1. Lumbar radiculopathy   2. Spinal stenosis of lumbar region with neurogenic claudication     Plan: No additional findings.   Meds & Orders:  Meds ordered this encounter  Medications  . betamethasone acetate-betamethasone sodium phosphate (CELESTONE) injection 12 mg    Orders Placed This Encounter  Procedures  . XR C-ARM NO REPORT  . Epidural Steroid injection    Follow-up: Return if symptoms worsen or fail to improve.   Procedures: No procedures performed  Lumbosacral Transforaminal Epidural Steroid Injection - Sub-Pedicular Approach with Fluoroscopic Guidance  Patient: Randy KLAS      Date of Birth: 10-21-1960 MRN: [id number] PCP: Patient, No Pcp Per      Visit Date: 12/21/2018   Universal Protocol:    Date/Time: 12/21/2018  Consent Given By: the patient  Position: PRONE  Additional Comments: Vital signs were monitored before and after the procedure. Patient was prepped and draped in the usual sterile fashion. The correct patient, procedure, and site was verified.   Injection Procedure Details:  Procedure Site One Meds Administered:  Meds ordered  this encounter  Medications  . betamethasone acetate-betamethasone sodium phosphate (CELESTONE) injection 12 mg    Laterality: Right  Location/Site: Patient has transitional S1 segment. L4-L5  Needle size: 22 G  Needle type: Spinal  Needle Placement: Transforaminal  Findings:    -Comments: Excellent flow of contrast along the nerve and into the epidural space.  Procedure Details: After squaring off the end-plates to get a true AP view, the C-arm was positioned so that an oblique view of the foramen as noted above was visualized. The target area is just inferior to the "nose of the scotty dog" or sub pedicular. The soft tissues overlying this structure were infiltrated with 2-3 ml. of 1% Lidocaine without Epinephrine.  The spinal needle was inserted toward the target using a "trajectory" view along the fluoroscope beam.  Under AP and lateral visualization, the needle was advanced so it did not puncture dura and was located close the 6 O'Clock position of the pedical in AP tracterory. Biplanar projections were used to confirm position. Aspiration was confirmed to be negative for CSF and/or blood. A 1-2 ml. volume of Isovue-250 was injected and flow of contrast was noted at each level. Radiographs were obtained for documentation purposes.   After attaining the desired flow of contrast documented above, a 0.5 to 1.0 ml test dose of 0.25% Marcaine was injected into each respective transforaminal space.  The patient was observed for 90 seconds post injection.  After no sensory deficits were reported, and normal lower extremity  motor function was noted,   the above injectate was administered so that equal amounts of the injectate were placed at each foramen (level) into the transforaminal epidural space.   Additional Comments:  The patient tolerated the procedure well Dressing: 2 x 2 sterile gauze and Band-Aid    Post-procedure details: Patient was observed during the procedure.  Post-procedure instructions were reviewed.  Patient left the clinic in stable condition.     Clinical History: MRI LUMBAR SPINE WITHOUT CONTRAST  TECHNIQUE: Multiplanar, multisequence MR imaging of the lumbar spine was performed. No intravenous contrast was administered.  COMPARISON:  Plain films 07/29/2018.  MRI lumbar spine 02/07/2015.  FINDINGS: Segmentation: Transitional anatomy. 12 rib-bearing thoracic vertebrae, in 6 non-rib-bearing vertebrae. S1-S2 is therefore the lowest open disc space. Numbering scheme was used previously is continued.  Alignment:  3 mm anterolisthesis L4-5. otherwise anatomic.  Vertebrae:  No worrisome osseous lesion.  Conus medullaris and cauda equina: Conus extends to the L1-L2 level. Conus and cauda equina appear normal.  Paraspinal and other soft tissues: Unremarkable.  Disc levels:  L1-L2: Chronic disc space narrowing. Annular bulge. No impingement.  L2-L3:  Normal interspace.  L3-L4: Loss of disc height and signal due to Schmorl's node. Annular bulge. Short pedicles. Posterior element hypertrophy. No impingement.  L4-L5: 3 mm anterolisthesis is facet mediated. Posterior element hypertrophy. Loss of disc height and signal. Central and leftward protrusion extends to the foramen. Short pedicles. Severe stenosis. LEFT greater than RIGHT subarticular zone and foraminal zone narrowing.  L5-S1: Preserved disc height, with desiccation. Short pedicles. Facet arthropathy. Borderline stenosis without definite subarticular zone narrowing. No impingement.  S1-S2: Unremarkable disc space.  Facet arthropathy.  No impingement.  Correlating with plain films, from May 2020, there is significant increase anterolisthesis at L4-5 with patient standing in flexion, which measures 7 mm on the outside radiographs.  IMPRESSION: Transitional anatomy. S1-S2 is the last open disc space. See discussion above.  Progression of  degenerative disc disease since 2016 with development of severe spinal stenosis at L4-5 of a multifactorial nature. 3 mm of facet mediated slip, post short pedicles, central and leftward protrusion, and posterior element hypertrophy. LEFT greater than RIGHT subarticular zone and foraminal zone narrowing at this level.  Dynamic instability, noted on prior radiographs at L4-5, up to 7 mm in standing flexion, further exacerbate stenosis and neural impingement at the L4-5 level.   Electronically Signed   By: Elsie Stain M.D.   On: 08/23/2018 14:15     Objective:  VS:  HT:    WT:   BMI:     BP:137/81  HR:60bpm  TEMP: ( )  RESP:  Physical Exam  Ortho Exam Imaging: No results found.

## 2019-02-02 ENCOUNTER — Encounter: Payer: Self-pay | Admitting: Orthopaedic Surgery

## 2019-02-02 ENCOUNTER — Other Ambulatory Visit: Payer: Self-pay

## 2019-02-02 ENCOUNTER — Ambulatory Visit (INDEPENDENT_AMBULATORY_CARE_PROVIDER_SITE_OTHER): Payer: Medicaid Other | Admitting: Orthopaedic Surgery

## 2019-02-02 VITALS — Ht 75.0 in | Wt 230.0 lb

## 2019-02-02 DIAGNOSIS — M48062 Spinal stenosis, lumbar region with neurogenic claudication: Secondary | ICD-10-CM

## 2019-02-02 NOTE — Progress Notes (Signed)
Office Visit Note   Patient: Randy Barnes           Date of Birth: [birth year]           MRN: [id number] Visit Date: 02/02/2019              Requested by: No referring provider defined for this encounter. 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  Plan: Patient has addressed his homelessness situation is in a stable condition he is clean and appropriately dressed and A1c is under good control.  He has been compliant with instructions and epidurals are not effective at this point.  Patient states he is ready to proceed with surgery so he can stand longer be more active and then can look at some opportunities for work resumption.  Plan would be single level lumbar fusion L4-5 with interbody cage and single level instrumentation at the L4-5 level.  Risk of dural tear, reoperation, pseudoarthrosis discussed.  Risk of injury to nerve root discussed.  Persistent back pain possible persistent leg pain discussed.  Questions elicited and answered he understands request proceed.  Plan will be 2 night stay and then home with postoperative lumbar brace, walker if needed and daily walking activities..  Follow-Up Instructions: No follow-ups on file.   Orders:  No orders of the defined types were placed in this encounter.  No orders of the defined types were placed in this encounter.     Procedures: No procedures performed   Clinical Data: No additional findings.   Subjective: Chief Complaint  Patient presents with  . Lower Back - Pain    HPI 58 year old male returns with ongoing problems with neck pain claudication symptoms and severe multifactorial spinal stenosis with congenitally short pedicles and 3 mm anterolisthesis at the L4-5 level.  Last MRI has not been effective.  His home situation is significantly improved now that he staying with his mother.  He is taking gabapentin also  ibuprofen without relief.  Had past history of alcohol abuse and depression and is not drinking at all.  Last A1c is 5.4 significantly improved from 4 years ago at 8.4.  Patient states his pain is severe he rates his pain is 11 out of 10 when he standing or walking.  He can walk 50 to 100 feet he can stand 5 to 9 minutes and has to sit down.  Last epidural 12/21/2018 was not effective.  Previously he got some relief with the injections.  Patient has single level lumbar spine pathology and plan surgery has been delayed until his overall health is better and now he has stable family situation staying with his mother.  He has been treated for hepatitis C and is on his last week of treatment and will be completed with full treatment.  Review of Systems 14 point systems positive for cataract right eye.  Prosthetic hypertrophy.  Aortoiliac arthrosclerosis.  Controlled diabetes.,  Hypertension, GERD.  Positive for depression, history of psoriasis.  L4-5 severe lumbar stenosis with neurogenic claudication.  Macular degeneration.  Objective: Vital Signs: Ht 6\' 3"  (1.905 m)   Wt 230 lb (104.3 kg)   BMI 28.75 kg/m   Physical Exam Constitutional:      Appearance: He is well-developed.  HENT:     Head: Normocephalic and atraumatic.  Eyes:     Pupils: Pupils are equal, round, and reactive to light.  Neck:  Thyroid: No thyromegaly.     Trachea: No tracheal deviation.  Cardiovascular:     Rate and Rhythm: Normal rate.  Pulmonary:     Effort: Pulmonary effort is normal.     Breath sounds: No wheezing.  Abdominal:     General: Bowel sounds are normal.     Palpations: Abdomen is soft.  Skin:    General: Skin is warm and dry.     Capillary Refill: Capillary refill takes less than 2 seconds.  Neurological:     Mental Status: He is alert and oriented to person, place, and time.  Psychiatric:        Behavior: Behavior normal.        Thought Content: Thought content normal.        Judgment: Judgment  normal.     Ortho Exam patient has negative logroll the hips knee and ankle jerk are intact anterior tib gastrocsoleus is intact.  Quads hip flexors are strong.  No plantar foot lesions distal pulses are intact.  Specialty Comments:  No specialty comments available.  Imaging: Study Result  CLINICAL DATA:  Low back pain radiating to the hips. Symptoms for years.  EXAM: MRI LUMBAR SPINE WITHOUT CONTRAST  TECHNIQUE: Multiplanar, multisequence MR imaging of the lumbar spine was performed. No intravenous contrast was administered.  COMPARISON:  Plain films 07/29/2018.  MRI lumbar spine 02/07/2015.  FINDINGS: Segmentation: Transitional anatomy. 12 rib-bearing thoracic vertebrae, in 6 non-rib-bearing vertebrae. S1-S2 is therefore the lowest open disc space. Numbering scheme was used previously is continued.  Alignment:  3 mm anterolisthesis L4-5. otherwise anatomic.  Vertebrae:  No worrisome osseous lesion.  Conus medullaris and cauda equina: Conus extends to the L1-L2 level. Conus and cauda equina appear normal.  Paraspinal and other soft tissues: Unremarkable.  Disc levels:  L1-L2: Chronic disc space narrowing. Annular bulge. No impingement.  L2-L3:  Normal interspace.  L3-L4: Loss of disc height and signal due to Schmorl's node. Annular bulge. Short pedicles. Posterior element hypertrophy. No impingement.  L4-L5: 3 mm anterolisthesis is facet mediated. Posterior element hypertrophy. Loss of disc height and signal. Central and leftward protrusion extends to the foramen. Short pedicles. Severe stenosis. LEFT greater than RIGHT subarticular zone and foraminal zone narrowing.  L5-S1: Preserved disc height, with desiccation. Short pedicles. Facet arthropathy. Borderline stenosis without definite subarticular zone narrowing. No impingement.  S1-S2: Unremarkable disc space.  Facet arthropathy.  No impingement.  Correlating with plain films, from May  2020, there is significant increase anterolisthesis at L4-5 with patient standing in flexion, which measures 7 mm on the outside radiographs.  IMPRESSION: Transitional anatomy. S1-S2 is the last open disc space. See discussion above.  Progression of degenerative disc disease since 2016 with development of severe spinal stenosis at L4-5 of a multifactorial nature. 3 mm of facet mediated slip, post short pedicles, central and leftward protrusion, and posterior element hypertrophy. LEFT greater than RIGHT subarticular zone and foraminal zone narrowing at this level.  Dynamic instability, noted on prior radiographs at L4-5, up to 7 mm in standing flexion, further exacerbate stenosis and neural impingement at the L4-5 level.   Electronically Signed   By: Elsie Stain M.D.   On: 08/23/2018 14:15      PMFS History: Patient Active Problem List   Diagnosis Date Noted  . Lumbar radiculopathy 01/25/2019  . Medication refill 08/30/2018  . Spinal stenosis of lumbar region 08/30/2018  . Homelessness 07/28/2018  . Age-related nuclear cataract of right eye 03/12/2016  . Multiple trauma  to chest 08/23/2015  . Erectile dysfunction 06/13/2015  . Cataract 06/13/2015  . Aorto-iliac atherosclerosis (HCC)   . Smoker 01/26/2015  . Alcohol use 01/26/2015  . MDD (major depressive disorder), recurrent episode, moderate (HCC)   . Psoriasis 01/08/2015  . Macular degeneration   . Chronic low back pain 12/26/2014  . Emphysema of lung (HCC)   . Chronic hepatitis C without hepatic coma (HCC)   . Anxiety disorder due to general medical condition with panic attack   . GERD (gastroesophageal reflux disease)   . Hyperlipidemia   . Controlled diabetes mellitus with diabetic neuropathy (HCC) 09/16/2014  . Essential hypertension 09/16/2014  . BPH (benign prostatic hyperplasia) 09/16/2014   Past Medical History:  Diagnosis Date  . Anxiety disorder due to general medical condition with panic  attack    h/o hospitalizations for behavioral issues (1980s, 2000s)  . Aorto-iliac atherosclerosis (HCC)   . Arthritis   . Cataract   . COPD (chronic obstructive pulmonary disease) (HCC)   . DDD (degenerative disc disease), lumbar    mild with dextroscoliosis  . Depression   . Emphysema of lung (HCC)    ?asthmatic bronchitis per prior PCP  . GERD (gastroesophageal reflux disease)   . Hepatitis C   . History of stomach ulcers   . Hyperlipidemia   . Hypertension   . Macular degeneration   . MDD (major depressive disorder), recurrent episode, moderate (HCC)   . Polysubstance (including opioids) dependence, daily use (HCC) 10/2015   narcotics, heroin  . 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 Problems related to release from prison 07/2014  . Psoriasis 01/08/2015  . Type 2 diabetes, uncontrolled, with neuropathy (HCC)   . Urine incontinence     Family History  Problem Relation Age of Onset  . Diabetes Father   . Heart failure Father   . CAD Father   . Stroke Mother   . Hypertension Mother   . Mental illness Mother   . Depression Mother   . Irritable bowel syndrome Mother   . Bipolar disorder Brother        ?  Marland Kitchen. CAD Paternal Grandfather   . Stroke Maternal Grandfather     Past Surgical History:  Procedure Laterality Date  . STRABISMUS SURGERY     left eye vision loss, multiple surgeries  . TONSILLECTOMY     Social History   Occupational History  . Not on file  Tobacco Use  . Smoking status: Current Every Day Smoker    Packs/day: 1.00    Years: 29.00    Pack years: 29.00    Types: Cigarettes    Start date: 03/18/1973  . Smokeless tobacco: Former NeurosurgeonUser    Types: Snuff  . Tobacco comment: Has tried dip in the past some, quit for 7 year   Substance and Sexual Activity  . Alcohol use: Yes    Alcohol/week: 6.0 standard drinks    Types: 6 Cans of beer per week    Comment: occasional, not regular  . Drug use: No    Types:  Marijuana, IV, Heroin, Oxycodone, Hydrocodone    Comment: 09/25/15 last use  . Sexual activity: Never

## 2019-02-23 ENCOUNTER — Telehealth: Payer: Self-pay | Admitting: Orthopaedic Surgery

## 2019-02-23 NOTE — Telephone Encounter (Signed)
In the notes that were sent the patient has a History and a physical exam. It is in the note. It has the pertinent info that they need. I had said circle the HPI with a pen and circle the physical exam including ortho exam and fax it to them and then call them.  Thanks

## 2019-02-23 NOTE — Telephone Encounter (Signed)
Can you please advise?

## 2019-02-23 NOTE — Telephone Encounter (Signed)
Uploaded the last office note on [facility] and labeled it med hx (only option applicable)

## 2019-02-23 NOTE — Telephone Encounter (Signed)
Patient is pending authorization with Medicaid for one level c-spine fusion with Dr Lorin Mercy.  Amy checked status on February 18, 2019  using  [facility].  Attached correspondence indicated their request for H&P exam within the pasty 30 days.  Amy had already sent the ov notes. Is there additional information that can be sent to them.  H&P exams are not usually scheduled until we have an authorization and surgery is actually scheduled.   Amy said she does not recall them ever asking for this in the past.  Please advise.  Again, most recent notes were sent prior to this request for additional information.

## 2019-02-24 ENCOUNTER — Telehealth: Payer: Self-pay | Admitting: Orthopaedic Surgery

## 2019-02-24 NOTE — Telephone Encounter (Signed)
Ok for rx?

## 2019-02-24 NOTE — Telephone Encounter (Signed)
Ok thank you 

## 2019-02-24 NOTE — Telephone Encounter (Signed)
Pt called in said he lost the prescription he was given for rolater walker, he is wondering if he can be given another one?  [phone]

## 2019-02-26 NOTE — Telephone Encounter (Signed)
I called patient. He found rx and is picking up walker today.

## 2019-03-04 ENCOUNTER — Other Ambulatory Visit: Payer: Self-pay

## 2019-03-04 ENCOUNTER — Encounter (HOSPITAL_COMMUNITY): Payer: Self-pay | Admitting: Psychiatry

## 2019-03-04 ENCOUNTER — Encounter: Payer: Self-pay | Admitting: Family Medicine

## 2019-03-04 ENCOUNTER — Ambulatory Visit: Payer: Medicaid Other | Attending: Family Medicine | Admitting: Family Medicine

## 2019-03-04 ENCOUNTER — Ambulatory Visit (INDEPENDENT_AMBULATORY_CARE_PROVIDER_SITE_OTHER): Payer: Medicaid Other | Admitting: Psychiatry

## 2019-03-04 DIAGNOSIS — M48062 Spinal stenosis, lumbar region with neurogenic claudication: Secondary | ICD-10-CM | POA: Diagnosis not present

## 2019-03-04 DIAGNOSIS — F319 Bipolar disorder, unspecified: Secondary | ICD-10-CM

## 2019-03-04 DIAGNOSIS — M5442 Lumbago with sciatica, left side: Secondary | ICD-10-CM

## 2019-03-04 DIAGNOSIS — F609 Personality disorder, unspecified: Secondary | ICD-10-CM

## 2019-03-04 DIAGNOSIS — G8929 Other chronic pain: Secondary | ICD-10-CM

## 2019-03-04 DIAGNOSIS — F1290 Cannabis use, unspecified, uncomplicated: Secondary | ICD-10-CM | POA: Diagnosis not present

## 2019-03-04 DIAGNOSIS — F411 Generalized anxiety disorder: Secondary | ICD-10-CM

## 2019-03-04 DIAGNOSIS — M5441 Lumbago with sciatica, right side: Secondary | ICD-10-CM

## 2019-03-04 DIAGNOSIS — J449 Chronic obstructive pulmonary disease, unspecified: Secondary | ICD-10-CM

## 2019-03-04 DIAGNOSIS — K219 Gastro-esophageal reflux disease without esophagitis: Secondary | ICD-10-CM | POA: Diagnosis not present

## 2019-03-04 DIAGNOSIS — J4489 Other specified chronic obstructive pulmonary disease: Secondary | ICD-10-CM

## 2019-03-04 DIAGNOSIS — I10 Essential (primary) hypertension: Secondary | ICD-10-CM

## 2019-03-04 DIAGNOSIS — E1140 Type 2 diabetes mellitus with diabetic neuropathy, unspecified: Secondary | ICD-10-CM

## 2019-03-04 DIAGNOSIS — F1011 Alcohol abuse, in remission: Secondary | ICD-10-CM

## 2019-03-04 MED ORDER — METHOCARBAMOL 500 MG PO TABS: 500.0000 mg | ORAL_TABLET | Freq: Three times a day (TID) | ORAL | 2 refills | Status: DC

## 2019-03-04 MED ORDER — GABAPENTIN 800 MG PO TABS: 800.0000 mg | ORAL_TABLET | Freq: Four times a day (QID) | ORAL | 1 refills | Status: DC

## 2019-03-04 MED ORDER — TRAMADOL HCL 50 MG PO TABS: 50.0000 mg | ORAL_TABLET | Freq: Three times a day (TID) | ORAL | 1 refills | Status: AC | PRN

## 2019-03-04 MED ORDER — LISINOPRIL 10 MG PO TABS: 10.0000 mg | ORAL_TABLET | Freq: Every day | ORAL | 1 refills | Status: DC

## 2019-03-04 MED ORDER — CARIPRAZINE HCL 1.5 MG PO CAPS: 1.5000 mg | ORAL_CAPSULE | Freq: Every day | ORAL | 2 refills | Status: DC

## 2019-03-04 MED ORDER — ATENOLOL 25 MG PO TABS: 25.0000 mg | ORAL_TABLET | Freq: Every day | ORAL | 1 refills | Status: DC

## 2019-03-04 MED ORDER — ALBUTEROL SULFATE HFA 108 (90 BASE) MCG/ACT IN AERS: 2.0000 | INHALATION_SPRAY | Freq: Four times a day (QID) | RESPIRATORY_TRACT | 0 refills | Status: DC | PRN

## 2019-03-04 MED ORDER — OMEPRAZOLE 20 MG PO CPDR: 20.0000 mg | DELAYED_RELEASE_CAPSULE | Freq: Two times a day (BID) | ORAL | 1 refills | Status: DC

## 2019-03-04 MED ORDER — BUDESONIDE-FORMOTEROL FUMARATE 160-4.5 MCG/ACT IN AERO: 2.0000 | INHALATION_SPRAY | Freq: Two times a day (BID) | RESPIRATORY_TRACT | 1 refills | Status: DC

## 2019-03-04 NOTE — Progress Notes (Signed)
Virtual Visit via Telephone Note  I connected with Randy Barnes on 03/04/19 at  2:10 PM EST by telephone and verified that I am speaking with the correct person using two identifiers.   I discussed the limitations, risks, security and privacy concerns of performing an evaluation and management service by telephone and the availability of in person appointments. I also discussed with the patient that there may be a patient responsible charge related to this service. The patient expressed understanding and agreed to proceed.  Patient Location: passenger in car Provider Location: [facility] Others participating in call: none   History of Present Illness:        58 year old male with diet-controlled type 2 diabetes, hypertension, COPD with ongoing tobacco use/tobacco dependence, hepatitis C for which he is undergoing treatment with [facility].  He reports that he does need refills of some of his medications at today's visit including his medicines for COPD.  He does not feel that he can stop smoking as he states that it helps him with his anxiety.  He does see a psychiatrist in follow-up of his issues with anxiety and history of substance abuse.  He reports no current drug use.  He continues to have issues with housing but is currently staying with a friend.          He reports that his fasting blood sugars continue to be less than 120.  He has had no increased thirst and no urinary frequency related to his blood sugars.  He continues to take his blood pressure medicine and reports no headaches or dizziness related to his blood pressure.  He has had no issues with chest pain or palpitations.  He denies any current issues with abdominal pain, no issues with acid reflux.  He has had no problems tolerating the medication prescribed by his infectious disease doctor for treatment of hepatitis C.  He reports continued chronic issues with low back pain with radiation down both legs.  He is awaiting surgery to be  scheduled by his orthopedic doctor.  He would like a refill of pain medication and muscle relaxants to help with his back pain.  Back pain stays around an 8 on a 0-to-10 scale and pain is sharp in his lower back and sharp/burning pain down his legs.  He sometimes feels as if his legs will suddenly become weak they make him feel as if he will fall.  He reports no additional falls since his last visit.    Past Medical History:  Diagnosis Date  . Anxiety disorder due to general medical condition with panic attack    h/o hospitalizations for behavioral issues (1980s, 2000s)  . Aorto-iliac atherosclerosis ([place])   . Arthritis   . Cataract   . COPD (chronic obstructive pulmonary disease) ([place])   . DDD (degenerative disc disease), lumbar    mild with dextroscoliosis  . Depression   . Emphysema of lung ([place])    ?asthmatic bronchitis per prior PCP  . GERD (gastroesophageal reflux disease)   . Hepatitis C   . History of stomach ulcers   . Hyperlipidemia   . Hypertension   . Macular degeneration   . MDD (major depressive disorder), recurrent episode, moderate ([place])   . Polysubstance (including opioids) dependence, daily use ([place]) 10/2015   narcotics, heroin  . 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 Problems related to release from prison 07/2014  . Psoriasis 01/08/2015  .  Type 2 diabetes, uncontrolled, with neuropathy (HCC)   . Urine incontinence     Past Surgical History:  Procedure Laterality Date  . STRABISMUS SURGERY     left eye vision loss, multiple surgeries  . TONSILLECTOMY      Family History  Problem Relation Age of Onset  . Diabetes Father   . Heart failure Father   . CAD Father   . Stroke Mother   . Hypertension Mother   . Mental illness Mother   . Depression Mother   . Irritable bowel syndrome Mother   . Bipolar disorder Brother        ?  Marland Kitchen CAD Paternal Grandfather   . Stroke Maternal Grandfather     Social History    Tobacco Use  . Smoking status: Current Every Day Smoker    Packs/day: 1.00    Years: 29.00    Pack years: 29.00    Types: Cigarettes    Start date: 03/18/1973  . Smokeless tobacco: Former Neurosurgeon    Types: Snuff  . Tobacco comment: Has tried dip in the past some, quit for 7 year   Substance Use Topics  . Alcohol use: Yes    Alcohol/week: 6.0 standard drinks    Types: 6 Cans of beer per week    Comment: occasional, not regular  . Drug use: No    Types: Marijuana, IV, Heroin, Oxycodone, Hydrocodone    Comment: 09/25/15 last use     Allergies: Penicillin-hives/rash; guaifenesin-severe cramping    Observations/Objective: No vital signs or physical exam conducted as visit was done via telephone  Assessment and Plan: 1. Spinal stenosis of lumbar region with neurogenic claudication Recent orthopedic notes reviewed.  Patient is scheduled for upcoming surgery.  Refills provided for gabapentin, Robaxin and tramadol to help with his neuropathic pain pending upcoming surgical intervention. - gabapentin (NEURONTIN) 800 MG tablet; Take 1 tablet (800 mg total) by mouth 4 (four) times daily.  Dispense: 360 tablet; Refill: 1 - methocarbamol (ROBAXIN) 500 MG tablet; Take 1 tablet (500 mg total) by mouth 3 (three) times daily. As needed for muscle spasm  Dispense: 90 tablet; Refill: 2 - traMADol (ULTRAM) 50 MG tablet; Take 1 tablet (50 mg total) by mouth every 8 (eight) hours as needed for moderate pain.  Dispense: 90 tablet; Refill: 1  2. Essential hypertension He reports her blood pressure remains controlled.  Refills provided of his atenolol and lisinopril.  Continue low-sodium diet and medication compliance. - atenolol (TENORMIN) 25 MG tablet; Take 1 tablet (25 mg total) by mouth daily.  Dispense: 90 tablet; Refill: 1 - lisinopril (ZESTRIL) 10 MG tablet; Take 1 tablet (10 mg total) by mouth daily.  Dispense: 90 tablet; Refill: 1  3. Gastroesophageal reflux disease without esophagitis Continue use  of omeprazole 20 mg twice daily for continued control of reflux symptoms.  Avoid late night eating and avoid known trigger foods - omeprazole (PRILOSEC) 20 MG capsule; Take 1 capsule (20 mg total) by mouth 2 (two) times daily before a meal.  Dispense: 180 capsule; Refill: 1  4. COPD with chronic bronchitis (HCC) He reports that he continues to smoke as it helps with his anxiety.  He is encouraged to consider follow-up with clinical pharmacist for help with smoking cessation.  Refill provided of albuterol to use as needed for shortness of breath and refill of Symbicort for daily use to help prevent COPD exacerbations, improve ease of breathing. - albuterol (VENTOLIN HFA) 108 (90 Base) MCG/ACT inhaler; Inhale 2 puffs  into the lungs every 6 (six) hours as needed for wheezing or shortness of breath.  Dispense: 54 g; Refill: 0 - budesonide-formoterol (SYMBICORT) 160-4.5 MCG/ACT inhaler; Inhale 2 puffs into the lungs 2 (two) times daily. (Patient taking differently: Inhale 2 puffs into the lungs 2 (two) times daily as needed (shortness of breath). )  Dispense: 3 Inhaler; Refill: 1  5. Controlled type 2 diabetes mellitus with diabetic neuropathy, without long-term current use of insulin (HCC) Diabetes is currently diet controlled on most recent hemoglobin A1c on 01/11/2019 was 5.4.  He is to continue low carbohydrate diet, exercise as tolerated though patient with limited ability to participate in meaningful exercise due to his chronic low back pain with radiation.  6. Chronic bilateral low back pain with bilateral sciatica Patient with chronic bilateral low back pain with sciatica due to lumbar degenerative disc disease and spinal stenosis.  Orthopedic notes reviewed and patient is scheduled for surgical intervention.  Surgery was postponed while patient completed treatment of hepatitis C with [facility]. - methocarbamol (ROBAXIN) 500 MG tablet; Take 1 tablet (500 mg total) by mouth 3 (three) times daily. As  needed for muscle spasm  Dispense: 90 tablet; Refill: 2 - traMADol (ULTRAM) 50 MG tablet; Take 1 tablet (50 mg total) by mouth every 8 (eight) hours as needed for moderate pain.  Dispense: 90 tablet; Refill: 1  Follow Up Instructions:Return in about 4 months (around 07/03/2019) for chronic issues; sooner if needed.    I discussed the assessment and treatment plan with the patient. The patient was provided an opportunity to ask questions and all were answered. The patient agreed with the plan and demonstrated an understanding of the instructions.   The patient was advised to call back or seek an in-person evaluation if the symptoms worsen or if the condition fails to improve as anticipated.  I provided 17 minutes of non-face-to-face time during this encounter.   Cain Saupeammie Pax Reasoner, MD

## 2019-03-04 NOTE — Progress Notes (Signed)
Patient verified DOB Patient has  Patient has Patient complains of back pain being present and needing back surgery. Patient had a tele-visit with [place] today.
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
## 2019-03-05 ENCOUNTER — Other Ambulatory Visit: Payer: Self-pay

## 2019-03-16 NOTE — Pre-Procedure Instructions (Signed)
Your procedure is scheduled on Wednesday, January 6th, from 12:30 PM to 4:18 PM.  Report to [facility]tr[facility] "A" at 10:30 A.M, and check in at the Admitting office.  Call this number if you have problems the morning of surgery:  [phone]  Call [phone] if you have any questions prior to your surgery date Monday-Friday 8am-4pm.    Remember:  Do not eat after midnight the night before your surgery.  You may drink clear liquids until 09:30 AM the morning of your surgery.   Clear liquids allowed are: Water, Non-Citrus Juices (without pulp), Carbonated Beverages, Clear Tea, Black Coffee Only, and Gatorade.    Take these medicines the morning of surgery with A SIP OF WATER : atenolol (TENORMIN)  cariprazine (VRAYLAR) gabapentin (NEURONTIN) omeprazole (PRILOSEC) tamsulosin (FLOMAX)   IF NEEDED: methocarbamol (ROBAXIN) traMADol (ULTRAM) albuterol (VENTOLIN HFA) inhaler. Please bring all inhalers with you the day of surgery.    As of today, STOP taking any Aspirin (unless otherwise instructed by your surgeon), Aleve, Naproxen, Ibuprofen, Motrin, Advil, Goody's, BC's, all herbal medications, fish oil, and all vitamins.   HOW TO MANAGE YOUR DIABETES BEFORE AND AFTER SURGERY  Why is it important to control my blood sugar before and after surgery? . Improving blood sugar levels before and after surgery helps healing and can limit problems. . A way of improving blood sugar control is eating a healthy diet by: o  Eating less sugar and carbohydrates o  Increasing activity/exercise o  Talking with your doctor about reaching your blood sugar goals . High blood sugars (greater than 180 mg/dL) can raise your risk of infections and slow your recovery, so you will need to focus on controlling your diabetes during the weeks before surgery. . Make sure that the doctor who takes care of your diabetes knows about your planned surgery including the date and location.  How do I  manage my blood sugar before surgery? . Check your blood sugar at least 4 times a day, starting 2 days before surgery, to make sure that the level is not too high or low. . Check your blood sugar the morning of your surgery when you wake up and every 2 hours until you get to the Short Stay unit. o If your blood sugar is less than 70 mg/dL, you will need to treat for low blood sugar: - Do not take insulin. - Treat a low blood sugar (less than 70 mg/dL) with  cup of clear juice (cranberry or apple), 4 glucose tablets, OR glucose gel. - Recheck blood sugar in 15 minutes after treatment (to make sure it is greater than 70 mg/dL). If your blood sugar is not greater than 70 mg/dL on recheck, call [phone] for further instructions. . Report your blood sugar to the short stay nurse when you get to Short Stay.  . If you are admitted to the hospital after surgery: o Your blood sugar will be checked by the staff and you will probably be given insulin after surgery (instead of oral diabetes medicines) to make sure you have good blood sugar levels. o The goal for blood sugar control after surgery is 80-180 mg/dL.   The Morning of Surgery  Do not wear jewelry.  Do not wear lotions, powders, colognes, or deodorant  Men may shave face and neck.  Do not bring valuables to the hospital.  [facility] is not responsible for any belongings or valuables.  If you are a smoker, DO NOT Smoke 24  hours prior to surgery  If you wear a CPAP at night please bring your mask, tubing, and machine the morning of surgery   Remember that you must have someone to transport you home after your surgery, and remain with you for 24 hours if you are discharged the same day.   Please bring cases for contacts, glasses, hearing aids, dentures or bridgework because it cannot be worn into surgery.    Leave your suitcase in the car.  After surgery it may be brought to your room.  For patients admitted to the hospital,  discharge time will be determined by your treatment team.  Patients discharged the day of surgery will not be allowed to drive home.    Special instructions:   [facility]- Preparing For Surgery  Before surgery, you can play an important role. Because skin is not sterile, your skin needs to be as free of germs as possible. You can reduce the number of germs on your skin by washing with CHG (chlorahexidine gluconate) Soap before surgery.  CHG is an antiseptic cleaner which kills germs and bonds with the skin to continue killing germs even after washing.    Oral Hygiene is also important to reduce your risk of infection.  Remember - BRUSH YOUR TEETH THE MORNING OF SURGERY WITH YOUR REGULAR TOOTHPASTE  Please do not use if you have an allergy to CHG or antibacterial soaps. If your skin becomes reddened/irritated stop using the CHG.  Do not shave (including legs and underarms) for at least 48 hours prior to first CHG shower. It is OK to shave your face.  Please follow these instructions carefully.   1. Shower the NIGHT BEFORE SURGERY and the MORNING OF SURGERY with CHG Soap.   2. If you chose to wash your hair, wash your hair first as usual with your normal shampoo.  3. After you shampoo, rinse your hair and body thoroughly to remove the shampoo.  4. Use CHG as you would any other liquid soap. You can apply CHG directly to the skin and wash gently with a scrungie or a clean washcloth.   5. Apply the CHG Soap to your body ONLY FROM THE NECK DOWN.  Do not use on open wounds or open sores. Avoid contact with your eyes, ears, mouth and genitals (private parts). Wash Face and genitals (private parts)  with your normal soap.   6. Wash thoroughly, paying special attention to the area where your surgery will be performed.  7. Thoroughly rinse your body with warm water from the neck down.  8. DO NOT shower/wash with your normal soap after using and rinsing off the CHG Soap.  9. Pat yourself dry  with a CLEAN TOWEL.  10. Wear CLEAN PAJAMAS to bed the night before surgery, wear comfortable clothes the morning of surgery  11. Place CLEAN SHEETS on your bed the night of your first shower and DO NOT SLEEP WITH PETS.    Day of Surgery:   Remember to brush your teeth WITH YOUR REGULAR TOOTHPASTE. Please shower the morning of surgery with the CHG soap Do not apply any deodorants/lotions. Please wear clean clothes to the hospital/surgery center.      Please read over the following fact sheets that you were given.

## 2019-03-17 ENCOUNTER — Telehealth: Payer: Self-pay | Admitting: Family Medicine

## 2019-03-17 ENCOUNTER — Other Ambulatory Visit: Payer: Self-pay

## 2019-03-17 ENCOUNTER — Ambulatory Visit (HOSPITAL_COMMUNITY)
Admission: RE | Admit: 2019-03-17 | Discharge: 2019-03-17 | Disposition: A | Discharge status: Home / Self Care | Payer: Medicaid Other | Source: Ambulatory Visit | Attending: Surgery | Admitting: Surgery

## 2019-03-17 ENCOUNTER — Encounter (HOSPITAL_COMMUNITY)
Admission: RE | Admit: 2019-03-17 | Discharge: 2019-03-17 | Disposition: A | Discharge status: Home / Self Care | Payer: Medicaid Other | Source: Ambulatory Visit | Attending: Orthopaedic Surgery | Admitting: Orthopaedic Surgery

## 2019-03-17 ENCOUNTER — Encounter (HOSPITAL_COMMUNITY): Payer: Self-pay

## 2019-03-17 ENCOUNTER — Ambulatory Visit (INDEPENDENT_AMBULATORY_CARE_PROVIDER_SITE_OTHER): Payer: Medicaid Other | Admitting: Surgery

## 2019-03-17 ENCOUNTER — Encounter: Payer: Self-pay | Admitting: Surgery

## 2019-03-17 VITALS — BP 140/84 | HR 84

## 2019-03-17 DIAGNOSIS — I739 Peripheral vascular disease, unspecified: Secondary | ICD-10-CM

## 2019-03-17 DIAGNOSIS — Z01818 Encounter for other preprocedural examination: Secondary | ICD-10-CM | POA: Insufficient documentation

## 2019-03-17 DIAGNOSIS — M48061 Spinal stenosis, lumbar region without neurogenic claudication: Secondary | ICD-10-CM

## 2019-03-17 HISTORY — DX: Pneumonia, unspecified organism: J18.9

## 2019-03-17 LAB — CBC
HCT: 45.2 % (ref 39.0–52.0)
Hemoglobin: 15.4 g/dL (ref 13.0–17.0)
MCH: 33.7 pg (ref 26.0–34.0)
MCHC: 34.1 g/dL (ref 30.0–36.0)
MCV: 98.9 fL (ref 80.0–100.0)
Platelets: 85 10*3/uL — ABNORMAL LOW (ref 150–400)
RBC: 4.57 MIL/uL (ref 4.22–5.81)
RDW: 12.4 % (ref 11.5–15.5)
WBC: 5.5 10*3/uL (ref 4.0–10.5)
nRBC: 0 % (ref 0.0–0.2)

## 2019-03-17 LAB — URINALYSIS, ROUTINE W REFLEX MICROSCOPIC
Bilirubin Urine: NEGATIVE
Glucose, UA: NEGATIVE mg/dL
Hgb urine dipstick: NEGATIVE
Ketones, ur: NEGATIVE mg/dL
Leukocytes,Ua: NEGATIVE
Nitrite: NEGATIVE
Protein, ur: NEGATIVE mg/dL
Specific Gravity, Urine: 1.027 (ref 1.005–1.030)
pH: 5 (ref 5.0–8.0)

## 2019-03-17 LAB — TYPE AND SCREEN
ABO/RH(D): O POS
Antibody Screen: NEGATIVE

## 2019-03-17 LAB — RAPID URINE DRUG SCREEN, HOSP PERFORMED
Amphetamines: POSITIVE — AB
Barbiturates: NOT DETECTED
Benzodiazepines: POSITIVE — AB
Cocaine: NOT DETECTED
Opiates: NOT DETECTED
Tetrahydrocannabinol: POSITIVE — AB

## 2019-03-17 LAB — COMPREHENSIVE METABOLIC PANEL
ALT: 24 U/L (ref 0–44)
AST: 30 U/L (ref 15–41)
Albumin: 3.8 g/dL (ref 3.5–5.0)
Alkaline Phosphatase: 59 U/L (ref 38–126)
Anion gap: 7 (ref 5–15)
BUN: 14 mg/dL (ref 6–20)
CO2: 28 mmol/L (ref 22–32)
Calcium: 9.3 mg/dL (ref 8.9–10.3)
Chloride: 104 mmol/L (ref 98–111)
Creatinine, Ser: 0.87 mg/dL (ref 0.61–1.24)
GFR calc Af Amer: >60 mL/min (ref >60)
GFR calc non Af Amer: >60 mL/min (ref >60)
Glucose, Bld: 114 mg/dL — ABNORMAL HIGH (ref 70–99)
Potassium: 4 mmol/L (ref 3.5–5.1)
Sodium: 139 mmol/L (ref 135–145)
Total Bilirubin: 0.3 mg/dL (ref 0.3–1.2)
Total Protein: 7.3 g/dL (ref 6.5–8.1)

## 2019-03-17 LAB — ABO/RH: ABO/RH(D): O POS

## 2019-03-17 LAB — HEMOGLOBIN A1C
Hgb A1c MFr Bld: 5 % (ref 4.8–5.6)
Mean Plasma Glucose: 96.8 mg/dL

## 2019-03-17 LAB — SURGICAL PCR SCREEN
MRSA, PCR: NEGATIVE
Staphylococcus aureus: POSITIVE — AB

## 2019-03-17 LAB — GLUCOSE, CAPILLARY: Glucose-Capillary: 114 mg/dL — ABNORMAL HIGH (ref 70–99)

## 2019-03-17 NOTE — Progress Notes (Addendum)
PCP - Cammie Fulp  Chest x-ray - 03-17-19 EKG - 03-17-19   DM - pt denies.  Patient stated that he is no longer diabetic after losing weight.  He has been taken off all diabetic medications.  Pt stated that he does not check CBG at home, any longer.   ERAS Protcol - yes   COVID TEST- Saturday, 03-20-19   Anesthesia review: yes, EKG  Patient denies shortness of breath, fever, cough and chest pain at PAT appointment   All instructions explained to the patient, with a verbal understanding of the material. Patient agrees to go over the instructions while at home for a better understanding. Patient also instructed to self quarantine after being tested for COVID-19. The opportunity to ask questions was provided.   Pt stated he was out of several medications and has attempted to get them refilled with no success.  Instructed patient to contact PCP to get medications refilled before surgery.

## 2019-03-17 NOTE — Telephone Encounter (Signed)
Pt called since he did not got his med tamsulosin (FLOMAX) 0.4 MG CAPS capsule  atenolol (TENORMIN) 25 MG tablet Glucose Blood (BLOOD GLUCOSE TEST STRIPS) STRP [id number] [place] [place]  [address], [place] [address]

## 2019-03-17 NOTE — Progress Notes (Signed)
PCR swab + for Staph  [facility].  Patient called, left voicemail with instructions.

## 2019-03-17 NOTE — Progress Notes (Signed)
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  States that symptoms unchanged from previous visit and he is wanting to proceed with L4-5 fusion as scheduled.  Today history and physical performed.  Preop clearances not requested by Dr. Lorin Mercy.  Patient states that he is followed by infectious disease for hepatitis C and finished up his treatments with them about a month ago.  Has follow-up visit in their clinic February 2021.  Surgical procedure along with potential hospital stay discussed with patient.  All questions answered and he wishes to proceed.

## 2019-03-18 ENCOUNTER — Encounter: Payer: Self-pay | Admitting: Orthopaedic Surgery

## 2019-03-18 ENCOUNTER — Encounter (HOSPITAL_COMMUNITY): Payer: Self-pay | Admitting: Physician Assistant

## 2019-03-18 NOTE — Progress Notes (Signed)
Anesthesia Chart Review:  History of Hep C, followed by GI, currently being treated with [place].   COPD followed by PCP Dr. Chapman Fitch, stable on regimen of inhalers per last note 12/04/18.   History of admission at [facility] in 2018 for precordial pain. Workup was benign. Serial cardiac enzymes were noted to be negative on 3 separate occasions and serial EKGs appeared to be benign. Nuclear medicine stress testing performed on day 2 of admission demonstrated a fixed infero-apical defect which was unchanged with stress or rest suggesting prior infarct but no evidence of acute ischemia. Patient was started on goal-directed therapy with atenolol 25 mg once daily, lisinopril 10 mg once daily, aspirin 81 mg once daily, he was also prescribed sublingual glycerin should have recurrent pain. Patient was discharged in stable condition with a plan for follow with his primary care provider for ongoing care and evaluation. He has since continued on lisinopril and atenolol, followed by PCP.   Previous hx of DMII, reversed with weight loss. Last A1c 5.0 on 03/17/19.  Preop labs reviewed, WNL.  EKG 03/17/19: Normal sinus rhythm. Rate 74. Minimal voltage criteria for LVH, may be normal variant ( Cornell product ). Septal infarct , age undetermined  Stress test 07/20/16 (care everywhere): FINDINGS: 1. Some decreased uptake noted of the inferior-apical segment at rest. 2. Stress images match the rest images. 3. There is some global hypokinesis. No paradoxical segment motion. 4. Left ventricular ejection fraction equals 52%.  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  No significant reversible ischemia identified.   Wynonia Musty [facility] Short Stay Center/Anesthesiology Phone [phone] 03/18/2019 3:08 PM

## 2019-03-18 NOTE — Anesthesia Preprocedure Evaluation (Deleted)
Anesthesia Evaluation    Airway        Dental   Pulmonary Current Smoker,           Cardiovascular hypertension,      Neuro/Psych    GI/Hepatic   Endo/Other  diabetes  Renal/GU      Musculoskeletal   Abdominal   Peds  Hematology   Anesthesia Other Findings   Reproductive/Obstetrics                             Anesthesia Physical Anesthesia Plan  ASA:   Anesthesia Plan:    Post-op Pain Management:    Induction:   PONV Risk Score and Plan:   Airway Management Planned:   Additional Equipment:   Intra-op Plan:   Post-operative Plan:   Informed Consent:   Plan Discussed with:   Anesthesia Plan Comments: (History of Hep C, followed by GI, currently being treated with [place].   COPD followed by PCP Dr. Chapman Fitch, stable on regimen of inhalers per last note 12/04/18.   History of admission at [facility] in 2018 for precordial pain. Workup was benign. Serial cardiac enzymes were noted to be negative on 3 separate occasions and serial EKGs appeared to be benign. Nuclear medicine stress testing performed on day 2 of admission demonstrated a fixed infero-apical defect which was unchanged with stress or rest suggesting prior infarct but no evidence of acute ischemia. Patient was started on goal-directed therapy with atenolol 25 mg once daily, lisinopril 10 mg once daily, aspirin 81 mg once daily, he was also prescribed sublingual glycerin should have recurrent pain. Patient was discharged in stable condition with a plan for follow with his primary care provider for ongoing care and evaluation. He has since continued on lisinopril and atenolol, followed by PCP.   Previous hx of DMII, reversed with weight loss. Last A1c 5.0 on 03/17/19.  Preop labs reviewed, WNL.  EKG 03/17/19: Normal sinus rhythm. Rate 74. Minimal voltage criteria for LVH, may be normal variant ( Cornell product ). Septal infarct ,  age undetermined  Stress test 07/20/16 (care everywhere): FINDINGS: 1. Some decreased uptake noted of the inferior-apical segment at rest. 2. Stress images match the rest images. 3. There is some global hypokinesis. No paradoxical segment motion. 4. Left ventricular ejection fraction equals 52%.  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  No significant reversible ischemia identified. )        Anesthesia Quick Evaluation

## 2019-03-20 ENCOUNTER — Other Ambulatory Visit (HOSPITAL_COMMUNITY): Payer: Medicaid Other

## 2019-03-22 ENCOUNTER — Telehealth (HOSPITAL_COMMUNITY): Payer: Self-pay

## 2019-03-22 NOTE — Telephone Encounter (Signed)
[place] TRACKS PRESCRIPTION COVERAGE APPROVED   VRAYLAR 1.5MG  CAPSULE PA# [id number] EFFECTIVE 03/18/2019 TO 03/12/2020

## 2019-03-24 ENCOUNTER — Telehealth: Payer: Self-pay | Admitting: Family Medicine

## 2019-03-24 ENCOUNTER — Inpatient Hospital Stay: Admit: 2019-03-24 | Payer: Medicaid Other | Admitting: Orthopaedic Surgery

## 2019-03-24 DIAGNOSIS — I10 Essential (primary) hypertension: Secondary | ICD-10-CM

## 2019-03-24 SURGERY — POSTERIOR LUMBAR FUSION 1 LEVEL
Anesthesia: General

## 2019-03-24 MED ORDER — TAMSULOSIN HCL 0.4 MG PO CAPS: 0.4000 mg | ORAL_CAPSULE | Freq: Two times a day (BID) | ORAL | 2 refills | Status: DC

## 2019-03-24 MED ORDER — BLOOD GLUCOSE TEST VI STRP: ORAL_STRIP | 11 refills | Status: DC

## 2019-03-24 NOTE — Telephone Encounter (Signed)
1) Medication(s) Requested (by name): -tamsulosin (FLOMAX) 0.4 MG CAPS capsule  -atenolol (TENORMIN) 25 MG tablet -Glucose Blood (BLOOD GLUCOSE TEST STRIPS) STRP 2) Pharmacy of Choice: -[id number] [place], [place] - [address]

## 2019-03-24 NOTE — Telephone Encounter (Signed)
Atenolol was sent already with refills. Will send for test strips and tamsulosin.

## 2019-04-02 ENCOUNTER — Inpatient Hospital Stay: Payer: Medicaid Other | Admitting: Orthopaedic Surgery

## 2019-05-10 ENCOUNTER — Ambulatory Visit: Payer: Medicaid Other | Admitting: Pharmacist

## 2019-05-22 ENCOUNTER — Encounter: Payer: Self-pay | Admitting: Family Medicine

## 2019-06-02 ENCOUNTER — Other Ambulatory Visit: Payer: Self-pay

## 2019-06-02 ENCOUNTER — Encounter (HOSPITAL_COMMUNITY): Payer: Self-pay | Admitting: Psychiatry

## 2019-06-02 ENCOUNTER — Ambulatory Visit (INDEPENDENT_AMBULATORY_CARE_PROVIDER_SITE_OTHER): Payer: Medicaid Other | Admitting: Psychiatry

## 2019-06-02 DIAGNOSIS — F319 Bipolar disorder, unspecified: Secondary | ICD-10-CM

## 2019-06-02 DIAGNOSIS — F609 Personality disorder, unspecified: Secondary | ICD-10-CM | POA: Diagnosis not present

## 2019-06-02 DIAGNOSIS — F411 Generalized anxiety disorder: Secondary | ICD-10-CM | POA: Diagnosis not present

## 2019-06-02 DIAGNOSIS — F1210 Cannabis abuse, uncomplicated: Secondary | ICD-10-CM | POA: Diagnosis not present

## 2019-06-02 MED ORDER — CARIPRAZINE HCL 1.5 MG PO CAPS: 1.5000 mg | ORAL_CAPSULE | Freq: Every day | ORAL | 2 refills | Status: DC
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
## 2019-06-24 ENCOUNTER — Telehealth: Payer: Self-pay | Admitting: Family Medicine

## 2019-06-24 DIAGNOSIS — K0889 Other specified disorders of teeth and supporting structures: Secondary | ICD-10-CM

## 2019-06-24 NOTE — Telephone Encounter (Signed)
Pt called to request a referral to a [facility] that will take Medicaid, please follow up

## 2019-06-25 NOTE — Addendum Note (Signed)
Addended by: Hoy Register on: 06/25/2019 08:20 AM   Modules accepted: Orders

## 2019-09-02 ENCOUNTER — Encounter (HOSPITAL_COMMUNITY): Payer: Self-pay | Admitting: Psychiatry

## 2019-09-02 ENCOUNTER — Telehealth (INDEPENDENT_AMBULATORY_CARE_PROVIDER_SITE_OTHER): Payer: Medicaid Other | Admitting: Psychiatry

## 2019-09-02 ENCOUNTER — Other Ambulatory Visit: Payer: Self-pay

## 2019-09-02 VITALS — Wt 230.0 lb

## 2019-09-02 DIAGNOSIS — F419 Anxiety disorder, unspecified: Secondary | ICD-10-CM | POA: Diagnosis not present

## 2019-09-02 DIAGNOSIS — F1210 Cannabis abuse, uncomplicated: Secondary | ICD-10-CM

## 2019-09-02 DIAGNOSIS — F319 Bipolar disorder, unspecified: Secondary | ICD-10-CM | POA: Diagnosis not present

## 2019-09-02 MED ORDER — CARIPRAZINE HCL 1.5 MG PO CAPS: 1.5000 mg | ORAL_CAPSULE | Freq: Every day | ORAL | 2 refills | Status: DC
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
## 2019-09-06 ENCOUNTER — Other Ambulatory Visit: Payer: Self-pay | Admitting: Family Medicine

## 2019-09-06 DIAGNOSIS — M48062 Spinal stenosis, lumbar region with neurogenic claudication: Secondary | ICD-10-CM

## 2019-09-06 DIAGNOSIS — K219 Gastro-esophageal reflux disease without esophagitis: Secondary | ICD-10-CM

## 2019-09-06 DIAGNOSIS — I10 Essential (primary) hypertension: Secondary | ICD-10-CM

## 2019-09-09 ENCOUNTER — Other Ambulatory Visit (INDEPENDENT_AMBULATORY_CARE_PROVIDER_SITE_OTHER): Payer: Self-pay | Admitting: *Deleted

## 2019-09-09 ENCOUNTER — Other Ambulatory Visit: Payer: Self-pay | Admitting: Pharmacist

## 2019-09-09 DIAGNOSIS — I10 Essential (primary) hypertension: Secondary | ICD-10-CM

## 2019-09-09 MED ORDER — LISINOPRIL 10 MG PO TABS: 10.0000 mg | ORAL_TABLET | Freq: Every day | ORAL | 0 refills | Status: DC

## 2019-09-09 MED ORDER — ATENOLOL 25 MG PO TABS: 25.0000 mg | ORAL_TABLET | Freq: Every day | ORAL | 0 refills | Status: DC

## 2019-09-09 NOTE — Telephone Encounter (Signed)
1) Medication(s) Requested (by name): tamsulosin (FLOMAX) 0.4 MG CAPS capsule  gabapentin (NEURONTIN) 800 MG tablet    2) Pharmacy of Choice: [id number] - [place], [place] - [address]   3) Special Requests:   Approved medications will be sent to the pharmacy, we will reach out if there is an issue.  Requests made after 3pm may not be addressed until the following business day!  If a patient is unsure of the name of the medication(s) please note and ask patient to call back when they are able to provide all info, do not send to responsible party until all information is available!

## 2019-09-15 ENCOUNTER — Other Ambulatory Visit: Payer: Self-pay | Admitting: Family Medicine

## 2019-09-15 DIAGNOSIS — J449 Chronic obstructive pulmonary disease, unspecified: Secondary | ICD-10-CM

## 2019-09-15 MED ORDER — TAMSULOSIN HCL 0.4 MG PO CAPS: 0.4000 mg | ORAL_CAPSULE | Freq: Two times a day (BID) | ORAL | 2 refills | Status: DC

## 2019-09-15 MED ORDER — BUDESONIDE-FORMOTEROL FUMARATE 160-4.5 MCG/ACT IN AERO: 2.0000 | INHALATION_SPRAY | Freq: Two times a day (BID) | RESPIRATORY_TRACT | 1 refills | Status: DC

## 2019-09-15 MED ORDER — ALBUTEROL SULFATE HFA 108 (90 BASE) MCG/ACT IN AERS: 2.0000 | INHALATION_SPRAY | Freq: Four times a day (QID) | RESPIRATORY_TRACT | 0 refills | Status: DC | PRN

## 2019-09-15 NOTE — Telephone Encounter (Signed)
Medication Refill - Medication: tamsulosin (FLOMAX) 0.4 MG CAPS capsule albuterol (VENTOLIN HFA) 108 (90 Base) MCG/ACT inhaler budesonide-formoterol (SYMBICORT) 160-4.5 MCG/ACT inhaler   Has the patient contacted their pharmacy? Yes.   (Agent: If no, request that the patient contact the pharmacy for the refill.) (Agent: If yes, when and what did the pharmacy advise?)  Preferred Pharmacy (with phone number or street name):  [id number] - [place], [place] - [address] [facility] [address]  [address] [place] [address]  Phone: [phone] Fax: [phone]     Agent: Please be advised that RX refills may take up to 3 business days. We ask that you follow-up with your pharmacy.

## 2019-10-07 ENCOUNTER — Encounter: Payer: Self-pay | Admitting: Internal Medicine

## 2019-10-07 ENCOUNTER — Telehealth: Payer: Self-pay | Admitting: Family Medicine

## 2019-10-07 ENCOUNTER — Other Ambulatory Visit: Payer: Self-pay

## 2019-10-07 ENCOUNTER — Ambulatory Visit: Payer: Medicaid Other | Attending: Internal Medicine | Admitting: Internal Medicine

## 2019-10-07 VITALS — BP 95/60 | HR 94 | Resp 16 | Wt 226.2 lb

## 2019-10-07 DIAGNOSIS — M5416 Radiculopathy, lumbar region: Secondary | ICD-10-CM | POA: Insufficient documentation

## 2019-10-07 DIAGNOSIS — M79604 Pain in right leg: Secondary | ICD-10-CM | POA: Insufficient documentation

## 2019-10-07 DIAGNOSIS — I10 Essential (primary) hypertension: Secondary | ICD-10-CM | POA: Diagnosis not present

## 2019-10-07 DIAGNOSIS — G8929 Other chronic pain: Secondary | ICD-10-CM | POA: Diagnosis not present

## 2019-10-07 DIAGNOSIS — N529 Male erectile dysfunction, unspecified: Secondary | ICD-10-CM | POA: Diagnosis not present

## 2019-10-07 DIAGNOSIS — Z79899 Other long term (current) drug therapy: Secondary | ICD-10-CM | POA: Diagnosis not present

## 2019-10-07 DIAGNOSIS — J449 Chronic obstructive pulmonary disease, unspecified: Secondary | ICD-10-CM | POA: Diagnosis not present

## 2019-10-07 DIAGNOSIS — Z760 Encounter for issue of repeat prescription: Secondary | ICD-10-CM | POA: Insufficient documentation

## 2019-10-07 DIAGNOSIS — H2511 Age-related nuclear cataract, right eye: Secondary | ICD-10-CM | POA: Diagnosis not present

## 2019-10-07 DIAGNOSIS — B182 Chronic viral hepatitis C: Secondary | ICD-10-CM | POA: Insufficient documentation

## 2019-10-07 DIAGNOSIS — E1136 Type 2 diabetes mellitus with diabetic cataract: Secondary | ICD-10-CM | POA: Insufficient documentation

## 2019-10-07 DIAGNOSIS — Z8249 Family history of ischemic heart disease and other diseases of the circulatory system: Secondary | ICD-10-CM | POA: Insufficient documentation

## 2019-10-07 DIAGNOSIS — E1140 Type 2 diabetes mellitus with diabetic neuropathy, unspecified: Secondary | ICD-10-CM | POA: Diagnosis not present

## 2019-10-07 DIAGNOSIS — M79605 Pain in left leg: Secondary | ICD-10-CM | POA: Diagnosis not present

## 2019-10-07 DIAGNOSIS — M48062 Spinal stenosis, lumbar region with neurogenic claudication: Secondary | ICD-10-CM | POA: Diagnosis not present

## 2019-10-07 DIAGNOSIS — F1910 Other psychoactive substance abuse, uncomplicated: Secondary | ICD-10-CM | POA: Diagnosis not present

## 2019-10-07 DIAGNOSIS — E785 Hyperlipidemia, unspecified: Secondary | ICD-10-CM | POA: Insufficient documentation

## 2019-10-07 DIAGNOSIS — F17210 Nicotine dependence, cigarettes, uncomplicated: Secondary | ICD-10-CM | POA: Diagnosis not present

## 2019-10-07 DIAGNOSIS — K219 Gastro-esophageal reflux disease without esophagitis: Secondary | ICD-10-CM | POA: Diagnosis not present

## 2019-10-07 DIAGNOSIS — R42 Dizziness and giddiness: Secondary | ICD-10-CM | POA: Diagnosis present

## 2019-10-07 LAB — POCT GLYCOSYLATED HEMOGLOBIN (HGB A1C): Hemoglobin A1C: 5.1 % (ref 4.0–5.6)

## 2019-10-07 LAB — GLUCOSE, POCT (MANUAL RESULT ENTRY): POC Glucose: 195 mg/dl — AB (ref 70–99)

## 2019-10-07 MED ORDER — GABAPENTIN 800 MG PO TABS: 800.0000 mg | ORAL_TABLET | Freq: Four times a day (QID) | ORAL | 1 refills | Status: DC

## 2019-10-07 MED ORDER — LISINOPRIL 5 MG PO TABS: 5.0000 mg | ORAL_TABLET | Freq: Every day | ORAL | 1 refills | Status: DC

## 2019-10-07 MED ORDER — SILDENAFIL CITRATE 50 MG PO TABS: ORAL_TABLET | ORAL | 2 refills | Status: DC

## 2019-10-07 MED ORDER — BUDESONIDE-FORMOTEROL FUMARATE 160-4.5 MCG/ACT IN AERO: 2.0000 | INHALATION_SPRAY | Freq: Two times a day (BID) | RESPIRATORY_TRACT | 1 refills | Status: DC

## 2019-10-07 MED ORDER — OMEPRAZOLE 20 MG PO CPDR: 20.0000 mg | DELAYED_RELEASE_CAPSULE | Freq: Two times a day (BID) | ORAL | 3 refills | Status: DC

## 2019-10-07 MED ORDER — ALBUTEROL SULFATE HFA 108 (90 BASE) MCG/ACT IN AERS: 2.0000 | INHALATION_SPRAY | Freq: Four times a day (QID) | RESPIRATORY_TRACT | 3 refills | Status: DC | PRN

## 2019-10-07 MED ORDER — ATENOLOL 25 MG PO TABS: 25.0000 mg | ORAL_TABLET | Freq: Every day | ORAL | 1 refills | Status: DC

## 2019-10-07 NOTE — Telephone Encounter (Signed)
Pt called in stating his insurance will not cover sildenafil (VIAGRA) 50 MG tablet [[id number]]   he is asking for a lower dosage to approved . Please advise
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
## 2019-10-07 NOTE — Patient Instructions (Addendum)
We will decrease the lisinopril to 5 mg daily due to your blood pressure being low today.  I have referred you for your diabetic eye exam. They will call you with the appointment.  Use the Viagra as needed for erectile dysfunction as discussed today.

## 2019-10-07 NOTE — Progress Notes (Signed)
Pt states is having pain in b/l legs

## 2019-10-08 MED ORDER — SILDENAFIL CITRATE 20 MG PO TABS: ORAL_TABLET | ORAL | 0 refills | Status: DC

## 2019-10-08 NOTE — Telephone Encounter (Signed)
Medicaid will not cover sildenafil at this dose. They will cover generic 20 mg sildenafil. Will forward information to Dr. Laural Benes.

## 2019-10-08 NOTE — Addendum Note (Signed)
Addended by: Jonah Blue B on: 10/08/2019 01:36 PM   Modules accepted: Orders

## 2019-11-06 ENCOUNTER — Other Ambulatory Visit: Payer: Self-pay | Admitting: Internal Medicine

## 2019-11-06 NOTE — Telephone Encounter (Signed)
Requested Prescriptions  Pending Prescriptions Disp Refills  . sildenafil (REVATIO) 20 MG tablet [Pharmacy Med Name: SILDENAFIL 20 MG TABLET] 20 tablet 0    Sig: 3 TABS BY MOUTH 1/2 HR PRIOR TO INTERCOURSE. LIMIT USE TO 3 TABS/24 HR     Urology: Erectile Dysfunction Agents Passed - 11/06/2019 10:21 PM      Passed - Last BP in normal range    BP Readings from Last 1 Encounters:  10/07/19 (!) 95/60         Passed - Valid encounter within last 12 months    Recent Outpatient Visits          1 month ago Essential hypertension   [facility] [place], Gavin Pound B, MD   8 months ago Spinal stenosis of lumbar region with neurogenic claudication   [facility] [place], [place], MD   11 months ago Controlled type 2 diabetes mellitus with diabetic neuropathy, without long-term current use of insulin (HCC)   [facility] Fulp, [place], MD   1 year ago Chronic hepatitis C without hepatic coma [facility])   [facility] [place], Kinnie Scales, NP   1 year ago COPD with chronic bronchitis [facility])   [facility] Storm Frisk, MD

## 2019-11-30 ENCOUNTER — Telehealth (INDEPENDENT_AMBULATORY_CARE_PROVIDER_SITE_OTHER): Payer: Medicaid Other | Admitting: Psychiatry

## 2019-11-30 ENCOUNTER — Other Ambulatory Visit: Payer: Self-pay

## 2019-11-30 ENCOUNTER — Encounter (HOSPITAL_COMMUNITY): Payer: Self-pay | Admitting: Psychiatry

## 2019-11-30 VITALS — Wt 226.0 lb

## 2019-11-30 DIAGNOSIS — F419 Anxiety disorder, unspecified: Secondary | ICD-10-CM | POA: Diagnosis not present

## 2019-11-30 DIAGNOSIS — F319 Bipolar disorder, unspecified: Secondary | ICD-10-CM | POA: Diagnosis not present

## 2019-11-30 DIAGNOSIS — F1210 Cannabis abuse, uncomplicated: Secondary | ICD-10-CM | POA: Diagnosis not present

## 2019-11-30 MED ORDER — CARIPRAZINE HCL 1.5 MG PO CAPS: 1.5000 mg | ORAL_CAPSULE | Freq: Every day | ORAL | 2 refills | Status: DC
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
## 2019-12-12 ENCOUNTER — Other Ambulatory Visit: Payer: Self-pay | Admitting: Family Medicine

## 2019-12-12 NOTE — Telephone Encounter (Signed)
Requested Prescriptions  Pending Prescriptions Disp Refills  . tamsulosin (FLOMAX) 0.4 MG CAPS capsule [Pharmacy Med Name: TAMSULOSIN HCL 0.4 MG CAPSULE] 180 capsule 2    Sig: TAKE 1 CAPSULE (0.4 MG TOTAL) BY MOUTH 2 (TWO) TIMES DAILY.     Urology: Alpha-Adrenergic Blocker Passed - 12/12/2019  3:57 PM      Passed - Last BP in normal range    BP Readings from Last 1 Encounters:  10/07/19 (!) 95/60         Passed - Valid encounter within last 12 months    Recent Outpatient Visits          2 months ago Essential hypertension   [facility] [place], Gavin Pound B, MD   9 months ago Spinal stenosis of lumbar region with neurogenic claudication   [facility] Fulp, [place], MD   1 year ago Controlled type 2 diabetes mellitus with diabetic neuropathy, without long-term current use of insulin (HCC)   [facility] [place], [place], MD   1 year ago Chronic hepatitis C without hepatic coma (HCC)   [facility] [place], Kinnie Scales, NP   1 year ago COPD with chronic bronchitis (HCC)   [facility] Storm Frisk, MD             . lisinopril (ZESTRIL) 10 MG tablet [Pharmacy Med Name: LISINOPRIL 10 MG TABLET] 30 tablet     Sig: TAKE 1 TABLET BY MOUTH EVERY DAY     Cardiovascular:  ACE Inhibitors Failed - 12/12/2019  3:57 PM      Failed - Cr in normal range and within 180 days    Creat  Date Value Ref Range Status  01/11/2019 1.12 0.70 - 1.33 mg/dL Final    Comment:    For patients >39 years of age, the reference limit for Creatinine is approximately 13% higher for people identified as African-American. .    Creatinine, Ser  Date Value Ref Range Status  03/17/2019 0.87 0.61 - 1.24 mg/dL Final         Failed - K in normal range and within 180 days    Potassium  Date Value Ref Range Status  03/17/2019 4.0 3.5 - 5.1 mmol/L Final  12/12/2014 4.5  mmol/L Final         Passed - Patient is not pregnant      Passed - Last BP in normal range    BP Readings from Last 1 Encounters:  10/07/19 (!) 95/60         Passed - Valid encounter within last 6 months    Recent Outpatient Visits          2 months ago Essential hypertension   [facility] [place], Gavin Pound B, MD   9 months ago Spinal stenosis of lumbar region with neurogenic claudication   [facility] Fulp, [place], MD   1 year ago Controlled type 2 diabetes mellitus with diabetic neuropathy, without long-term current use of insulin (HCC)   [facility] Fulp, [place], MD   1 year ago Chronic hepatitis C without hepatic coma [facility])   [facility] [place], Kinnie Scales, NP   1 year ago COPD with chronic bronchitis [facility])   [facility] Storm Frisk, MD

## 2020-01-04 ENCOUNTER — Other Ambulatory Visit: Payer: Self-pay | Admitting: Family Medicine

## 2020-01-09 ENCOUNTER — Other Ambulatory Visit: Payer: Self-pay | Admitting: Internal Medicine

## 2020-01-09 NOTE — Telephone Encounter (Signed)
Requested Prescriptions  Pending Prescriptions Disp Refills   sildenafil (REVATIO) 20 MG tablet [Pharmacy Med Name: SILDENAFIL 20 MG TABLET] 20 tablet 1    Sig: 3 TABS BY MOUTH 1/2 HR PRIOR TO INTERCOURSE. LIMIT USE TO 3 TABS/24 HR     Urology: Erectile Dysfunction Agents Passed - 01/09/2020  6:22 PM      Passed - Last BP in normal range    BP Readings from Last 1 Encounters:  10/07/19 (!) 95/60         Passed - Valid encounter within last 12 months    Recent Outpatient Visits          3 months ago Essential hypertension   [facility] [place], Gavin Pound B, MD   10 months ago Spinal stenosis of lumbar region with neurogenic claudication   [facility] Fulp, [place], MD   1 year ago Controlled type 2 diabetes mellitus with diabetic neuropathy, without long-term current use of insulin (HCC)   [facility] Fulp, [place], MD   1 year ago Chronic hepatitis C without hepatic coma [facility])   [facility] [place], Kinnie Scales, NP   1 year ago COPD with chronic bronchitis [facility])   [facility] Storm Frisk, MD

## 2020-02-08 ENCOUNTER — Other Ambulatory Visit: Payer: Self-pay | Admitting: Internal Medicine

## 2020-02-08 DIAGNOSIS — K219 Gastro-esophageal reflux disease without esophagitis: Secondary | ICD-10-CM

## 2020-02-28 ENCOUNTER — Other Ambulatory Visit: Payer: Self-pay

## 2020-02-28 ENCOUNTER — Encounter (HOSPITAL_COMMUNITY): Payer: Self-pay | Admitting: Psychiatry

## 2020-02-28 ENCOUNTER — Telehealth (INDEPENDENT_AMBULATORY_CARE_PROVIDER_SITE_OTHER): Payer: Medicaid Other | Admitting: Psychiatry

## 2020-02-28 VITALS — Wt 225.0 lb

## 2020-02-28 DIAGNOSIS — F319 Bipolar disorder, unspecified: Secondary | ICD-10-CM

## 2020-02-28 DIAGNOSIS — F1210 Cannabis abuse, uncomplicated: Secondary | ICD-10-CM

## 2020-02-28 DIAGNOSIS — F419 Anxiety disorder, unspecified: Secondary | ICD-10-CM

## 2020-02-28 MED ORDER — CARIPRAZINE HCL 1.5 MG PO CAPS: 1.5000 mg | ORAL_CAPSULE | Freq: Every day | ORAL | 2 refills | Status: DC
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
## 2020-03-13 ENCOUNTER — Other Ambulatory Visit: Payer: Self-pay | Admitting: Internal Medicine

## 2020-03-13 DIAGNOSIS — I10 Essential (primary) hypertension: Secondary | ICD-10-CM

## 2020-03-16 LAB — HM DIABETES EYE EXAM

## 2020-03-31 ENCOUNTER — Other Ambulatory Visit: Payer: Self-pay | Admitting: Internal Medicine

## 2020-03-31 DIAGNOSIS — I10 Essential (primary) hypertension: Secondary | ICD-10-CM

## 2020-03-31 NOTE — Telephone Encounter (Signed)
Copied from CRM [id number]. Topic: Quick Communication - Rx Refill/Question >> Mar 31, 2020  4:25 PM Jaquita Rector A wrote: Medication: lisinopril (ZESTRIL) 5 MG tablet, sildenafil (REVATIO) 20 MG tablet   Has the patient contacted their pharmacy? Yes.   (Agent: If no, request that the patient contact the pharmacy for the refill.) (Agent: If yes, when and what did the pharmacy advise?)  Preferred Pharmacy (with phone number or street name): [id number] - [place], [place] - [address]  Phone:  [phone] Fax:  [phone]     Agent: Please be advised that RX refills may take up to 3 business days. We ask that you follow-up with your pharmacy.

## 2020-04-05 ENCOUNTER — Ambulatory Visit: Payer: Medicaid Other | Attending: Internal Medicine | Admitting: Internal Medicine

## 2020-04-05 ENCOUNTER — Encounter: Payer: Self-pay | Admitting: Internal Medicine

## 2020-04-05 ENCOUNTER — Other Ambulatory Visit: Payer: Self-pay

## 2020-04-05 VITALS — BP 129/78 | HR 75 | Resp 16 | Wt 255.6 lb

## 2020-04-05 DIAGNOSIS — I10 Essential (primary) hypertension: Secondary | ICD-10-CM

## 2020-04-05 DIAGNOSIS — E0840 Diabetes mellitus due to underlying condition with diabetic neuropathy, unspecified: Secondary | ICD-10-CM | POA: Diagnosis not present

## 2020-04-05 DIAGNOSIS — K219 Gastro-esophageal reflux disease without esophagitis: Secondary | ICD-10-CM

## 2020-04-05 DIAGNOSIS — R300 Dysuria: Secondary | ICD-10-CM

## 2020-04-05 DIAGNOSIS — M48062 Spinal stenosis, lumbar region with neurogenic claudication: Secondary | ICD-10-CM | POA: Diagnosis not present

## 2020-04-05 DIAGNOSIS — J449 Chronic obstructive pulmonary disease, unspecified: Secondary | ICD-10-CM

## 2020-04-05 LAB — POCT URINALYSIS DIP (CLINITEK)
Bilirubin, UA: NEGATIVE
Blood, UA: NEGATIVE
Glucose, UA: NEGATIVE mg/dL
Ketones, POC UA: NEGATIVE mg/dL
Leukocytes, UA: NEGATIVE
Nitrite, UA: NEGATIVE
POC PROTEIN,UA: NEGATIVE
Spec Grav, UA: >=1.03 — AB (ref 1.010–1.025)
Urobilinogen, UA: 1 E.U./dL
pH, UA: 6 (ref 5.0–8.0)

## 2020-04-05 MED ORDER — ATENOLOL 25 MG PO TABS: 25.0000 mg | ORAL_TABLET | Freq: Every day | ORAL | 1 refills | Status: DC

## 2020-04-05 MED ORDER — TAMSULOSIN HCL 0.4 MG PO CAPS: 0.4000 mg | ORAL_CAPSULE | Freq: Two times a day (BID) | ORAL | 2 refills | Status: DC

## 2020-04-05 MED ORDER — LISINOPRIL 5 MG PO TABS
5.0000 mg | ORAL_TABLET | Freq: Every day | ORAL | 0 refills | Status: DC
Start: 2020-04-05 — End: 2020-06-15

## 2020-04-05 MED ORDER — ALBUTEROL SULFATE HFA 108 (90 BASE) MCG/ACT IN AERS: 2.0000 | INHALATION_SPRAY | Freq: Four times a day (QID) | RESPIRATORY_TRACT | 3 refills | Status: DC | PRN

## 2020-04-05 MED ORDER — GABAPENTIN 800 MG PO TABS: 800.0000 mg | ORAL_TABLET | Freq: Four times a day (QID) | ORAL | 1 refills | Status: DC

## 2020-04-05 MED ORDER — BUDESONIDE-FORMOTEROL FUMARATE 160-4.5 MCG/ACT IN AERO: 2.0000 | INHALATION_SPRAY | Freq: Two times a day (BID) | RESPIRATORY_TRACT | 1 refills | Status: DC

## 2020-04-05 MED ORDER — OMEPRAZOLE 20 MG PO CPDR: 20.0000 mg | DELAYED_RELEASE_CAPSULE | Freq: Two times a day (BID) | ORAL | 2 refills | Status: DC

## 2020-04-05 NOTE — Progress Notes (Signed)
Pt states he has discomfort when urinating

## 2020-04-05 NOTE — Assessment & Plan Note (Signed)
He has gained weight Will check a1c today
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
## 2020-04-06 LAB — CBC WITH DIFFERENTIAL/PLATELET
Basophils Absolute: 0 10*3/uL (ref 0.0–0.2)
Basos: 1 %
EOS (ABSOLUTE): 0.2 10*3/uL (ref 0.0–0.4)
Eos: 3 %
Hematocrit: 48.2 % (ref 37.5–51.0)
Hemoglobin: 16.2 g/dL (ref 13.0–17.7)
Immature Grans (Abs): 0 10*3/uL (ref 0.0–0.1)
Immature Granulocytes: 1 %
Lymphocytes Absolute: 1.4 10*3/uL (ref 0.7–3.1)
Lymphs: 26 %
MCH: 33.5 pg — ABNORMAL HIGH (ref 26.6–33.0)
MCHC: 33.6 g/dL (ref 31.5–35.7)
MCV: 100 fL — ABNORMAL HIGH (ref 79–97)
Monocytes Absolute: 0.6 10*3/uL (ref 0.1–0.9)
Monocytes: 11 %
Neutrophils Absolute: 3 10*3/uL (ref 1.4–7.0)
Neutrophils: 58 %
Platelets: 102 10*3/uL — ABNORMAL LOW (ref 150–450)
RBC: 4.84 x10E6/uL (ref 4.14–5.80)
RDW: 12.5 % (ref 11.6–15.4)
WBC: 5.2 10*3/uL (ref 3.4–10.8)

## 2020-04-06 LAB — HEPATIC FUNCTION PANEL
ALT: 25 IU/L (ref 0–44)
AST: 30 IU/L (ref 0–40)
Albumin: 4.6 g/dL (ref 3.8–4.9)
Alkaline Phosphatase: 86 IU/L (ref 44–121)
Bilirubin Total: 0.4 mg/dL (ref 0.0–1.2)
Bilirubin, Direct: 0.14 mg/dL (ref 0.00–0.40)
Total Protein: 7.4 g/dL (ref 6.0–8.5)

## 2020-04-06 LAB — BASIC METABOLIC PANEL
BUN/Creatinine Ratio: 15 (ref 9–20)
BUN: 18 mg/dL (ref 6–24)
CO2: 26 mmol/L (ref 20–29)
Calcium: 9.9 mg/dL (ref 8.7–10.2)
Chloride: 101 mmol/L (ref 96–106)
Creatinine, Ser: 1.24 mg/dL (ref 0.76–1.27)
GFR calc Af Amer: 74 mL/min/{1.73_m2} (ref >59)
GFR calc non Af Amer: 64 mL/min/{1.73_m2} (ref >59)
Glucose: 110 mg/dL — ABNORMAL HIGH (ref 65–99)
Potassium: 5 mmol/L (ref 3.5–5.2)
Sodium: 138 mmol/L (ref 134–144)

## 2020-04-06 LAB — LIPID PANEL
Chol/HDL Ratio: 3.4 ratio (ref 0.0–5.0)
Cholesterol, Total: 175 mg/dL (ref 100–199)
HDL: 51 mg/dL (ref >39)
LDL Chol Calc (NIH): 105 mg/dL — ABNORMAL HIGH (ref 0–99)
Triglycerides: 107 mg/dL (ref 0–149)
VLDL Cholesterol Cal: 19 mg/dL (ref 5–40)

## 2020-04-06 LAB — HEMOGLOBIN A1C
Est. average glucose Bld gHb Est-mCnc: 117 mg/dL
Hgb A1c MFr Bld: 5.7 % — ABNORMAL HIGH (ref 4.8–5.6)

## 2020-04-07 LAB — URINE CULTURE

## 2020-04-10 LAB — HCV RT-PCR, QUANT (NON-GRAPH): Hepatitis C Quantitation: NOT DETECTED IU/mL

## 2020-04-10 LAB — HCV AB W REFLEX TO QUANT PCR: HCV Ab: >11 s/co ratio — ABNORMAL HIGH (ref 0.0–0.9)

## 2020-04-28 ENCOUNTER — Other Ambulatory Visit: Payer: Self-pay | Admitting: General Practice

## 2020-04-28 MED ORDER — SILDENAFIL CITRATE 20 MG PO TABS: ORAL_TABLET | ORAL | 1 refills | Status: DC

## 2020-04-28 NOTE — Telephone Encounter (Signed)
Copied from CRM [id number]. Topic: Quick Communication - Rx Refill/Question >> Apr 28, 2020  3:05 PM Gaetana Michaelis A wrote: Medication: sildenafil (REVATIO) 20 MG tablet   Has the patient contacted their pharmacy? Yes. Patient was directed to touch base with PCP  Preferred Pharmacy (with phone number or street name): [id number] - [place], [place] - [address]  Phone:  [phone]  Agent: Please be advised that RX refills may take up to 3 business days. We ask that you follow-up with your pharmacy.

## 2020-05-16 ENCOUNTER — Ambulatory Visit: Payer: Self-pay | Admitting: Internal Medicine

## 2020-05-23 ENCOUNTER — Telehealth (HOSPITAL_COMMUNITY): Payer: Medicaid Other | Admitting: Psychiatry

## 2020-05-23 ENCOUNTER — Other Ambulatory Visit: Payer: Self-pay

## 2020-05-24 ENCOUNTER — Telehealth (HOSPITAL_COMMUNITY): Payer: Medicaid Other | Admitting: Psychiatry

## 2020-06-15 ENCOUNTER — Encounter (HOSPITAL_COMMUNITY): Payer: Self-pay | Admitting: Psychiatry

## 2020-06-15 ENCOUNTER — Telehealth (INDEPENDENT_AMBULATORY_CARE_PROVIDER_SITE_OTHER): Payer: Medicaid Other | Admitting: Psychiatry

## 2020-06-15 ENCOUNTER — Other Ambulatory Visit: Payer: Self-pay | Admitting: Internal Medicine

## 2020-06-15 ENCOUNTER — Telehealth (HOSPITAL_COMMUNITY): Payer: Self-pay | Admitting: *Deleted

## 2020-06-15 ENCOUNTER — Other Ambulatory Visit: Payer: Self-pay

## 2020-06-15 VITALS — Wt 255.0 lb

## 2020-06-15 DIAGNOSIS — F1210 Cannabis abuse, uncomplicated: Secondary | ICD-10-CM

## 2020-06-15 DIAGNOSIS — F419 Anxiety disorder, unspecified: Secondary | ICD-10-CM | POA: Diagnosis not present

## 2020-06-15 DIAGNOSIS — I10 Essential (primary) hypertension: Secondary | ICD-10-CM

## 2020-06-15 DIAGNOSIS — F319 Bipolar disorder, unspecified: Secondary | ICD-10-CM

## 2020-06-15 MED ORDER — CARIPRAZINE HCL 1.5 MG PO CAPS: 1.5000 mg | ORAL_CAPSULE | Freq: Every day | ORAL | 2 refills | Status: DC
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
## 2020-06-15 NOTE — Telephone Encounter (Signed)
PA for Vrylar 1.5 mg submitted via CoverMyMeds. Pt Key # [id number]. Awaiting determination.

## 2020-06-23 ENCOUNTER — Telehealth (HOSPITAL_COMMUNITY): Payer: Self-pay | Admitting: *Deleted

## 2020-06-23 NOTE — Telephone Encounter (Signed)
PA for Vrylar approved and pt is picking up Rx this morning. FYI.

## 2020-07-04 ENCOUNTER — Other Ambulatory Visit: Payer: Self-pay | Admitting: Internal Medicine

## 2020-07-04 DIAGNOSIS — I10 Essential (primary) hypertension: Secondary | ICD-10-CM

## 2020-07-18 ENCOUNTER — Other Ambulatory Visit: Payer: Self-pay | Admitting: Internal Medicine

## 2020-07-18 DIAGNOSIS — I10 Essential (primary) hypertension: Secondary | ICD-10-CM

## 2020-07-29 ENCOUNTER — Other Ambulatory Visit: Payer: Self-pay | Admitting: Internal Medicine

## 2020-07-29 DIAGNOSIS — I10 Essential (primary) hypertension: Secondary | ICD-10-CM

## 2020-07-29 NOTE — Telephone Encounter (Signed)
Last RF 04/05/20 #90 1 RF - Requesting too soon- needs a cosigner-

## 2020-08-14 ENCOUNTER — Other Ambulatory Visit: Payer: Self-pay | Admitting: Internal Medicine

## 2020-08-14 DIAGNOSIS — I10 Essential (primary) hypertension: Secondary | ICD-10-CM

## 2020-09-12 ENCOUNTER — Other Ambulatory Visit: Payer: Self-pay | Admitting: Internal Medicine

## 2020-09-12 DIAGNOSIS — I10 Essential (primary) hypertension: Secondary | ICD-10-CM

## 2020-09-14 ENCOUNTER — Other Ambulatory Visit: Payer: Self-pay

## 2020-09-14 ENCOUNTER — Encounter (HOSPITAL_COMMUNITY): Payer: Self-pay | Admitting: Psychiatry

## 2020-09-14 ENCOUNTER — Telehealth (INDEPENDENT_AMBULATORY_CARE_PROVIDER_SITE_OTHER): Payer: Medicaid Other | Admitting: Psychiatry

## 2020-09-14 VITALS — Wt 240.0 lb

## 2020-09-14 DIAGNOSIS — F1210 Cannabis abuse, uncomplicated: Secondary | ICD-10-CM | POA: Diagnosis not present

## 2020-09-14 DIAGNOSIS — F319 Bipolar disorder, unspecified: Secondary | ICD-10-CM | POA: Diagnosis not present

## 2020-09-14 DIAGNOSIS — F419 Anxiety disorder, unspecified: Secondary | ICD-10-CM

## 2020-09-14 MED ORDER — CARIPRAZINE HCL 1.5 MG PO CAPS: 1.5000 mg | ORAL_CAPSULE | Freq: Every day | ORAL | 2 refills | Status: DC
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
## 2020-10-27 ENCOUNTER — Other Ambulatory Visit: Payer: Self-pay | Admitting: Internal Medicine

## 2020-10-27 DIAGNOSIS — I10 Essential (primary) hypertension: Secondary | ICD-10-CM

## 2020-10-27 NOTE — Telephone Encounter (Signed)
Requested medication (s) are due for refill today: no  Requested medication (s) are on the active medication list: yes  Last refill:  09/12/2020  Future visit scheduled:  no  Notes to clinic:  overdue for office visit Vm left for patient to contact office   Requested Prescriptions  Pending Prescriptions Disp Refills   lisinopril (ZESTRIL) 5 MG tablet [Pharmacy Med Name: LISINOPRIL 5 MG TABLET] 30 tablet 0    Sig: TAKE 1 TABLET (5 MG TOTAL) BY MOUTH DAILY.     Cardiovascular:  ACE Inhibitors Failed - 10/27/2020  9:22 AM      Failed - Cr in normal range and within 180 days    Creat  Date Value Ref Range Status  01/11/2019 1.12 0.70 - 1.33 mg/dL Final    Comment:    For patients >54 years of age, the reference limit for Creatinine is approximately 13% higher for people identified as African-American. .    Creatinine, Ser  Date Value Ref Range Status  04/05/2020 1.24 0.76 - 1.27 mg/dL Final          Failed - K in normal range and within 180 days    Potassium  Date Value Ref Range Status  04/05/2020 5.0 3.5 - 5.2 mmol/L Final  12/12/2014 4.5 mmol/L Final          Failed - Valid encounter within last 6 months    Recent Outpatient Visits           6 months ago Dysuria   [facility] Swords, Valetta Mole, MD   1 year ago Essential hypertension   [place] [facility] Marcine Matar, MD   1 year ago Spinal stenosis of lumbar region with neurogenic claudication   [place] [facility] Fulp, Delton, MD   1 year ago Controlled type 2 diabetes mellitus with diabetic neuropathy, without long-term current use of insulin (HCC)   [place] [facility] Fulp, Clemmons, MD   2 years ago Chronic hepatitis C without hepatic coma [facility])   [facility] [place], Kinnie Scales, NP              Passed - Patient is not pregnant      Passed - Last BP in normal range    BP  Readings from Last 1 Encounters:  04/05/20 129/78

## 2020-11-11 ENCOUNTER — Other Ambulatory Visit: Payer: Self-pay | Admitting: Internal Medicine

## 2020-11-11 DIAGNOSIS — K219 Gastro-esophageal reflux disease without esophagitis: Secondary | ICD-10-CM

## 2020-11-11 NOTE — Telephone Encounter (Signed)
Requested Prescriptions  Pending Prescriptions Disp Refills  . omeprazole (PRILOSEC) 20 MG capsule [Pharmacy Med Name: OMEPRAZOLE DR 20 MG CAPSULE] 180 capsule 2    Sig: TAKE 1 CAPSULE (20 MG TOTAL) BY MOUTH 2 (TWO) TIMES DAILY BEFORE A MEAL. DX: K21.9 GERD     Gastroenterology: Proton Pump Inhibitors Passed - 11/11/2020  5:02 PM      Passed - Valid encounter within last 12 months    Recent Outpatient Visits          7 months ago Dysuria   [facility] Swords, Valetta Mole, MD   1 year ago Essential hypertension   [place] [facility] Marcine Matar, MD   1 year ago Spinal stenosis of lumbar region with neurogenic claudication   [place] [facility] Fulp, [place], MD   1 year ago Controlled type 2 diabetes mellitus with diabetic neuropathy, without long-term current use of insulin (HCC)   [place] [facility] Fulp, Jackson, MD   2 years ago Chronic hepatitis C without hepatic coma [facility])   [facility] [place], Kinnie Scales, NP

## 2020-11-14 ENCOUNTER — Encounter (HOSPITAL_COMMUNITY): Payer: Self-pay

## 2020-11-14 ENCOUNTER — Other Ambulatory Visit: Payer: Self-pay

## 2020-11-14 ENCOUNTER — Ambulatory Visit (HOSPITAL_COMMUNITY)
Admission: EM | Admit: 2020-11-14 | Discharge: 2020-11-14 | Disposition: A | Discharge status: Home / Self Care | Payer: Medicaid Other | Attending: Physician Assistant | Admitting: Physician Assistant

## 2020-11-14 DIAGNOSIS — F1510 Other stimulant abuse, uncomplicated: Secondary | ICD-10-CM

## 2020-11-14 DIAGNOSIS — L299 Pruritus, unspecified: Secondary | ICD-10-CM | POA: Diagnosis not present

## 2020-11-14 DIAGNOSIS — F424 Excoriation (skin-picking) disorder: Secondary | ICD-10-CM

## 2020-11-14 MED ORDER — PERMETHRIN 5 % EX CREA: TOPICAL_CREAM | CUTANEOUS | 0 refills | Status: DC

## 2020-11-14 NOTE — ED Triage Notes (Signed)
Pt presents with c/o parasites and scabies. Pt states he feels burning and itching on his skin. Pt states he can see bugs under his skin all over his body.  Pt states he swallowed dust and states it is making him sick and want to cough. Pt states he feels the bugs are biting him right now. Pt states he has not been able to sleep or 2-3 nights because of the bug bites.
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
## 2020-12-07 ENCOUNTER — Other Ambulatory Visit: Payer: Self-pay | Admitting: Nurse Practitioner

## 2020-12-07 DIAGNOSIS — I10 Essential (primary) hypertension: Secondary | ICD-10-CM

## 2020-12-07 MED ORDER — LISINOPRIL 5 MG PO TABS: 5.0000 mg | ORAL_TABLET | Freq: Every day | ORAL | 0 refills | Status: DC

## 2020-12-07 MED ORDER — SILDENAFIL CITRATE 20 MG PO TABS: ORAL_TABLET | ORAL | 1 refills | Status: DC

## 2020-12-07 MED ORDER — ATENOLOL 25 MG PO TABS: 25.0000 mg | ORAL_TABLET | Freq: Every day | ORAL | 0 refills | Status: DC

## 2020-12-07 NOTE — Telephone Encounter (Signed)
Patient called in needs for short supply of atenolol (TENORMIN) 25 MG tablet , lisinopril 5mg  and sildenafil (REVATIO) 20 MG tablet  until his appt on 10/31.

## 2020-12-18 ENCOUNTER — Other Ambulatory Visit: Payer: Self-pay

## 2020-12-18 ENCOUNTER — Telehealth (HOSPITAL_COMMUNITY): Payer: Medicaid Other | Admitting: Psychiatry

## 2020-12-26 ENCOUNTER — Encounter: Payer: Self-pay | Admitting: Nurse Practitioner

## 2020-12-26 ENCOUNTER — Ambulatory Visit: Payer: Medicaid Other | Attending: Nurse Practitioner | Admitting: Nurse Practitioner

## 2020-12-26 ENCOUNTER — Other Ambulatory Visit: Payer: Self-pay

## 2020-12-26 DIAGNOSIS — Z789 Other specified health status: Secondary | ICD-10-CM

## 2020-12-26 DIAGNOSIS — E0840 Diabetes mellitus due to underlying condition with diabetic neuropathy, unspecified: Secondary | ICD-10-CM

## 2020-12-26 DIAGNOSIS — Z760 Encounter for issue of repeat prescription: Secondary | ICD-10-CM | POA: Diagnosis not present

## 2020-12-26 DIAGNOSIS — Z1211 Encounter for screening for malignant neoplasm of colon: Secondary | ICD-10-CM

## 2020-12-26 DIAGNOSIS — E785 Hyperlipidemia, unspecified: Secondary | ICD-10-CM | POA: Diagnosis not present

## 2020-12-26 DIAGNOSIS — I10 Essential (primary) hypertension: Secondary | ICD-10-CM | POA: Diagnosis not present

## 2020-12-26 DIAGNOSIS — I700 Atherosclerosis of aorta: Secondary | ICD-10-CM

## 2020-12-26 DIAGNOSIS — I708 Atherosclerosis of other arteries: Secondary | ICD-10-CM

## 2020-12-26 DIAGNOSIS — R7989 Other specified abnormal findings of blood chemistry: Secondary | ICD-10-CM
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
## 2021-01-05 ENCOUNTER — Other Ambulatory Visit: Payer: Self-pay | Admitting: Nurse Practitioner

## 2021-01-05 DIAGNOSIS — J449 Chronic obstructive pulmonary disease, unspecified: Secondary | ICD-10-CM

## 2021-01-05 DIAGNOSIS — R300 Dysuria: Secondary | ICD-10-CM

## 2021-01-05 DIAGNOSIS — M48062 Spinal stenosis, lumbar region with neurogenic claudication: Secondary | ICD-10-CM

## 2021-01-05 DIAGNOSIS — J4489 Other specified chronic obstructive pulmonary disease: Secondary | ICD-10-CM

## 2021-01-05 NOTE — Telephone Encounter (Signed)
Medication Refill - Medication: gabapentin (NEURONTIN) 800 MG tablet tamsulosin (FLOMAX) 0.4 MG CAPS capsule sildenafil (REVATIO) 20 MG tablet budesonide-formoterol (SYMBICORT) 160-4.5 MCG/ACT inhaler  albuterol (VENTOLIN HFA) 108 (90 Base) MCG/ACT inhaler   Pt is out of these medications  Has the patient contacted their pharmacy? Yes.   (Agent: If no, request that the patient contact the pharmacy for the refill. If patient does not wish to contact the pharmacy document the reason why and proceed with request.) (Agent: If yes, when and what did the pharmacy advise?) Yes, contact PCP   Preferred Pharmacy (with phone number or street name):  [id number] - [place] ([place]), [place] - [address]  [address]  ([place]) [place] [address]  Phone: [phone] Fax: [phone]   Has the patient been seen for an appointment in the last year OR does the patient have an upcoming appointment? Yes.    Agent: Please be advised that RX refills may take up to 3 business days. We ask that you follow-up with your pharmacy.

## 2021-01-06 NOTE — Telephone Encounter (Signed)
Per chart review, pt needs to have office visit and lab work before any med refilled.  From VV dated 12/26/20: Follow Up Instructions Return for NEEDS OFFICE VISIT. Labs before medications will be filled. ..     Pt has upcoming OV 01/10/21 Routing this prescription request back to office.

## 2021-01-10 ENCOUNTER — Other Ambulatory Visit: Payer: Self-pay

## 2021-01-10 ENCOUNTER — Ambulatory Visit: Payer: Medicaid Other | Admitting: Physician Assistant

## 2021-01-10 ENCOUNTER — Telehealth: Payer: Medicaid Other | Admitting: Physician Assistant

## 2021-01-10 ENCOUNTER — Ambulatory Visit: Payer: Medicaid Other | Attending: Family Medicine

## 2021-01-10 DIAGNOSIS — Z789 Other specified health status: Secondary | ICD-10-CM

## 2021-01-10 DIAGNOSIS — R7989 Other specified abnormal findings of blood chemistry: Secondary | ICD-10-CM

## 2021-01-10 DIAGNOSIS — F1090 Alcohol use, unspecified, uncomplicated: Secondary | ICD-10-CM

## 2021-01-10 DIAGNOSIS — E785 Hyperlipidemia, unspecified: Secondary | ICD-10-CM

## 2021-01-10 DIAGNOSIS — E0840 Diabetes mellitus due to underlying condition with diabetic neuropathy, unspecified: Secondary | ICD-10-CM

## 2021-01-10 DIAGNOSIS — I10 Essential (primary) hypertension: Secondary | ICD-10-CM

## 2021-01-11 ENCOUNTER — Other Ambulatory Visit (INDEPENDENT_AMBULATORY_CARE_PROVIDER_SITE_OTHER): Payer: Self-pay | Admitting: Primary Care

## 2021-01-11 LAB — CMP14+EGFR
ALT: 24 IU/L (ref 0–44)
AST: 38 IU/L (ref 0–40)
Albumin/Globulin Ratio: 1.3 (ref 1.2–2.2)
Albumin: 4.4 g/dL (ref 3.8–4.9)
Alkaline Phosphatase: 144 IU/L — ABNORMAL HIGH (ref 44–121)
BUN/Creatinine Ratio: 11 (ref 9–20)
BUN: 12 mg/dL (ref 6–24)
Bilirubin Total: 0.8 mg/dL (ref 0.0–1.2)
CO2: 22 mmol/L (ref 20–29)
Calcium: 9.2 mg/dL (ref 8.7–10.2)
Chloride: 102 mmol/L (ref 96–106)
Creatinine, Ser: 1.05 mg/dL (ref 0.76–1.27)
Globulin, Total: 3.4 g/dL (ref 1.5–4.5)
Glucose: 197 mg/dL — ABNORMAL HIGH (ref 70–99)
Potassium: 4.2 mmol/L (ref 3.5–5.2)
Sodium: 137 mmol/L (ref 134–144)
Total Protein: 7.8 g/dL (ref 6.0–8.5)
eGFR: 82 mL/min/{1.73_m2} (ref >59)

## 2021-01-11 LAB — CBC
Hematocrit: 42.2 % (ref 37.5–51.0)
Hemoglobin: 14.4 g/dL (ref 13.0–17.7)
MCH: 30.6 pg (ref 26.6–33.0)
MCHC: 34.1 g/dL (ref 31.5–35.7)
MCV: 90 fL (ref 79–97)
Platelets: 403 10*3/uL (ref 150–450)
RBC: 4.7 x10E6/uL (ref 4.14–5.80)
RDW: 12.2 % (ref 11.6–15.4)
WBC: 9.5 10*3/uL (ref 3.4–10.8)

## 2021-01-11 LAB — LIPID PANEL
Chol/HDL Ratio: 2.4 ratio (ref 0.0–5.0)
Cholesterol, Total: 166 mg/dL (ref 100–199)
HDL: 68 mg/dL (ref >39)
LDL Chol Calc (NIH): 81 mg/dL (ref 0–99)
Triglycerides: 96 mg/dL (ref 0–149)
VLDL Cholesterol Cal: 17 mg/dL (ref 5–40)

## 2021-01-11 LAB — HEMOGLOBIN A1C
Est. average glucose Bld gHb Est-mCnc: 105 mg/dL
Hgb A1c MFr Bld: 5.3 % (ref 4.8–5.6)

## 2021-01-11 MED ORDER — SILDENAFIL CITRATE 20 MG PO TABS: ORAL_TABLET | ORAL | 1 refills | Status: DC

## 2021-01-11 MED ORDER — ALBUTEROL SULFATE HFA 108 (90 BASE) MCG/ACT IN AERS: 2.0000 | INHALATION_SPRAY | Freq: Four times a day (QID) | RESPIRATORY_TRACT | 3 refills | Status: DC | PRN

## 2021-01-11 MED ORDER — BUDESONIDE-FORMOTEROL FUMARATE 160-4.5 MCG/ACT IN AERO: 2.0000 | INHALATION_SPRAY | Freq: Two times a day (BID) | RESPIRATORY_TRACT | 1 refills | Status: DC

## 2021-01-11 MED ORDER — GABAPENTIN 800 MG PO TABS: 800.0000 mg | ORAL_TABLET | Freq: Four times a day (QID) | ORAL | 1 refills | Status: DC

## 2021-01-11 MED ORDER — TAMSULOSIN HCL 0.4 MG PO CAPS: 0.4000 mg | ORAL_CAPSULE | Freq: Two times a day (BID) | ORAL | 2 refills | Status: DC

## 2021-01-11 NOTE — Telephone Encounter (Signed)
Will route to provider covering for PCP.  ?

## 2021-01-15 ENCOUNTER — Ambulatory Visit: Payer: Medicaid Other | Admitting: Nurse Practitioner

## 2021-03-15 ENCOUNTER — Other Ambulatory Visit (INDEPENDENT_AMBULATORY_CARE_PROVIDER_SITE_OTHER): Payer: Self-pay | Admitting: Primary Care

## 2021-03-15 DIAGNOSIS — I10 Essential (primary) hypertension: Secondary | ICD-10-CM

## 2021-03-15 NOTE — Telephone Encounter (Signed)
Medication: lisinopril (ZESTRIL) 5 MG tablet [[id number]] , sildenafil (REVATIO) 20 MG tablet [[id number]]   Has the patient contacted their pharmacy? YES  (Agent: If no, request that the patient contact the pharmacy for the refill. If patient does not wish to contact the pharmacy document the reason why and proceed with request.) (Agent: If yes, when and what did the pharmacy advise?)  Preferred Pharmacy (with phone number or street name): [id number] - [place] ([place]), [place] - [address] [place] ([place]) [place] [address] Phone: [phone] Fax: [phone] Hours: Not open 24 hours   Has the patient been seen for an appointment in the last year OR does the patient have an upcoming appointment? YES 12/26/20  Agent: Please be advised that RX refills may take up to 3 business days. We ask that you follow-up with your pharmacy.

## 2021-03-16 MED ORDER — LISINOPRIL 5 MG PO TABS: 5.0000 mg | ORAL_TABLET | Freq: Every day | ORAL | 0 refills | Status: DC

## 2021-03-16 MED ORDER — SILDENAFIL CITRATE 20 MG PO TABS: ORAL_TABLET | ORAL | 0 refills | Status: DC

## 2021-03-16 NOTE — Telephone Encounter (Signed)
Requested medication (s) are on the active medication list: yes  Future visit scheduled: no, canceled 01/15/21  Notes to clinic:  No PCP listed, is this pt still associated with this practice? Please assess.  Requested Prescriptions  Pending Prescriptions Disp Refills   sildenafil (REVATIO) 20 MG tablet 20 tablet 1    Sig: 3 TABS BY MOUTH 1/2 HR PRIOR TO INTERCOURSE. LIMIT USE TO 3 TABS/24 HR     Urology: Erectile Dysfunction Agents Failed - 03/15/2021  5:33 PM      Failed - Last BP in normal range    BP Readings from Last 1 Encounters:  11/14/20 (!) 161/94          Passed - Valid encounter within last 12 months    Recent Outpatient Visits           2 months ago Medication refill   [facility] [place], Shea Stakes, NP   11 months ago Dysuria   [facility] Swords, Valetta Mole, MD   1 year ago Essential hypertension   [facility] Marcine Matar, MD   2 years ago Spinal stenosis of lumbar region with neurogenic claudication   [facility] Fulp, [place], MD   2 years ago Controlled type 2 diabetes mellitus with diabetic neuropathy, without long-term current use of insulin (HCC)   [facility] Fulp, [place], MD               lisinopril (ZESTRIL) 5 MG tablet 30 tablet 0    Sig: Take 1 tablet (5 mg total) by mouth daily.     Cardiovascular:  ACE Inhibitors Failed - 03/15/2021  5:33 PM      Failed - Last BP in normal range    BP Readings from Last 1 Encounters:  11/14/20 (!) 161/94          Passed - Cr in normal range and within 180 days    Creat  Date Value Ref Range Status  01/11/2019 1.12 0.70 - 1.33 mg/dL Final    Comment:    For patients >33 years of age, the reference limit for Creatinine is approximately 13% higher for people identified as African-American. .    Creatinine, Ser  Date Value Ref Range Status   01/10/2021 1.05 0.76 - 1.27 mg/dL Final          Passed - K in normal range and within 180 days    Potassium  Date Value Ref Range Status  01/10/2021 4.2 3.5 - 5.2 mmol/L Final  12/12/2014 4.5 mmol/L Final          Passed - Patient is not pregnant      Passed - Valid encounter within last 6 months    Recent Outpatient Visits           2 months ago Medication refill   [facility] [place], Shea Stakes, NP   11 months ago Dysuria   [facility] Swords, Valetta Mole, MD   1 year ago Essential hypertension   [facility] Jonah Blue B, MD   2 years ago Spinal stenosis of lumbar region with neurogenic claudication   [facility] Fulp, [place], MD   2 years ago Controlled type 2 diabetes mellitus with diabetic neuropathy, without long-term current use of insulin (HCC)   [facility] [place], [place], MD

## 2021-04-16 ENCOUNTER — Telehealth (HOSPITAL_COMMUNITY): Payer: Self-pay

## 2021-04-16 NOTE — Telephone Encounter (Signed)
Patient called requesting a refill on his Vraylar 1.5mg . He uses [facility] on [address] in [place]. Pt has a followup appointment scheduled for this coming Friday, 2/3. Pt stated that he's having a hard time. Please review and advise. Thank you

## 2021-04-16 NOTE — Telephone Encounter (Signed)
I have not seen him since June. I am not sure if he is taking meds.

## 2021-04-18 ENCOUNTER — Telehealth (HOSPITAL_COMMUNITY): Payer: Self-pay | Admitting: *Deleted

## 2021-04-18 NOTE — Telephone Encounter (Signed)
PA submitted for Vraylar 1.5 mg qd via CoverMyMeds. Awaiting determination.

## 2021-04-19 ENCOUNTER — Telehealth (HOSPITAL_COMMUNITY): Payer: Self-pay | Admitting: *Deleted

## 2021-04-19 ENCOUNTER — Other Ambulatory Visit (HOSPITAL_COMMUNITY): Payer: Self-pay | Admitting: *Deleted

## 2021-04-19 DIAGNOSIS — F419 Anxiety disorder, unspecified: Secondary | ICD-10-CM

## 2021-04-19 DIAGNOSIS — F319 Bipolar disorder, unspecified: Secondary | ICD-10-CM

## 2021-04-19 MED ORDER — CARIPRAZINE HCL 1.5 MG PO CAPS: 1.5000 mg | ORAL_CAPSULE | Freq: Every day | ORAL | 0 refills | Status: DC

## 2021-04-19 NOTE — Telephone Encounter (Signed)
Vraylar 1.5 mg capsules #14 samples given to pt's brother, Jorja Loa, to take to pt as he has no transportation. Writer has spoken with pt several times yesterday and today, as well as pt's mother Carney Bern (who is  in the hospitalized) in regards to PA status. Pt has been labile, crying, irritable, hypomanic according to pt and family. Pt insurance has changed and so PA has been submitted, awaiting determination. Mother did mention that Leafy Kindle is been so successful in treating Bi Polar disorder that she worries excessively when he's not on it. Pt actually has an appointment on tomorrow 05/18/21 however appears to need medication ASAP and while waiting for determination of PA.

## 2021-04-20 ENCOUNTER — Other Ambulatory Visit: Payer: Self-pay

## 2021-04-20 ENCOUNTER — Telehealth (HOSPITAL_BASED_OUTPATIENT_CLINIC_OR_DEPARTMENT_OTHER): Payer: Medicaid Other | Admitting: Psychiatry

## 2021-04-20 ENCOUNTER — Encounter (HOSPITAL_COMMUNITY): Payer: Self-pay | Admitting: Psychiatry

## 2021-04-20 VITALS — Wt 235.0 lb

## 2021-04-20 DIAGNOSIS — F419 Anxiety disorder, unspecified: Secondary | ICD-10-CM | POA: Diagnosis not present

## 2021-04-20 DIAGNOSIS — F319 Bipolar disorder, unspecified: Secondary | ICD-10-CM

## 2021-04-20 DIAGNOSIS — F1210 Cannabis abuse, uncomplicated: Secondary | ICD-10-CM

## 2021-04-20 MED ORDER — CARIPRAZINE HCL 1.5 MG PO CAPS: 1.5000 mg | ORAL_CAPSULE | Freq: Every day | ORAL | 1 refills | Status: AC
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
## 2021-04-23 ENCOUNTER — Telehealth (HOSPITAL_COMMUNITY): Payer: Self-pay | Admitting: *Deleted

## 2021-04-23 NOTE — Telephone Encounter (Signed)
PA FOR VRYALAR 1.5 MG CAPSULES APPROVED BY [employer] ON 04/18/21. THIS IS INFORMATION PROVIDED IN FAX. [employer] HAS NO FURTHER INFORMATION, I.E. PA NUMBER. PROVIDER SERVICES NUMBER IS [phone].

## 2021-05-04 ENCOUNTER — Other Ambulatory Visit: Payer: Self-pay | Admitting: Nurse Practitioner

## 2021-05-04 NOTE — Telephone Encounter (Signed)
Medication: sildenafil (REVATIO) 20 MG tablet [[id number]]   Has the patient contacted their pharmacy? YES  (Agent: If no, request that the patient contact the pharmacy for the refill. If patient does not wish to contact the pharmacy document the reason why and proceed with request.) (Agent: If yes, when and what did the pharmacy advise?)  Preferred Pharmacy (with phone number or street name): [facility] - [place] ([place]), [place] - [address] [place] ([place]) [place] [address] Phone: [phone] Fax: [phone] Hours: Not open 24 hours   Has the patient been seen for an appointment in the last year OR does the patient have an upcoming appointment? YES 01/10/21  Agent: Please be advised that RX refills may take up to 3 business days. We ask that you follow-up with your pharmacy.

## 2021-05-05 ENCOUNTER — Other Ambulatory Visit (INDEPENDENT_AMBULATORY_CARE_PROVIDER_SITE_OTHER): Payer: Self-pay | Admitting: Family Medicine

## 2021-05-05 NOTE — Telephone Encounter (Signed)
Requested medication (s) are due for refill today: yes  Requested medication (s) are on the active medication list: yes  Last refill:  03/16/21 #20 with 0 RF  Future visit scheduled: NO  Notes to clinic:  Has already had a curtesy refill and there is no upcoming appointment scheduled.   Requested Prescriptions  Pending Prescriptions Disp Refills   sildenafil (REVATIO) 20 MG tablet 20 tablet 0    Sig: 3 TABS BY MOUTH 1/2 HR PRIOR TO INTERCOURSE. LIMIT USE TO 3 TABS/24 HR     Urology: Erectile Dysfunction Agents Failed - 05/04/2021  4:31 PM      Failed - Last BP in normal range    BP Readings from Last 1 Encounters:  11/14/20 (!) 161/94          Passed - AST in normal range and within 360 days    AST  Date Value Ref Range Status  01/10/2021 38 0 - 40 IU/L Final  12/12/2014 24 U/L Final          Passed - ALT in normal range and within 360 days    ALT  Date Value Ref Range Status  01/10/2021 24 0 - 44 IU/L Final  09/27/2015 17 9 - 46 U/L Final          Passed - Valid encounter within last 12 months    Recent Outpatient Visits           4 months ago Medication refill   [place], Vernia Buff, NP   1 year ago [place]words, Darrick Penna, MD   1 year ago Essential hypertension   [place] Karle Plumber B, MD   2 years ago Spinal stenosis of lumbar region with neurogenic claudication   [place], MD   2 years ago Controlled type 2 diabetes mellitus with diabetic neuropathy, without long-term current use of insulin ([place])   [facility] [place], [place], MD

## 2021-05-07 NOTE — Telephone Encounter (Signed)
Requested Prescriptions  Pending Prescriptions Disp Refills   sildenafil (REVATIO) 20 MG tablet [Pharmacy Med Name: Sildenafil Citrate 20 MG Oral Tablet] 20 tablet 0    Sig: TAKE 3 TABLETS BY MOUTH 1/2 HOUR PRIOR TO INTERCOURSE. LIMIT USE TO 3 TABS/ 24 HOURS     Urology: Erectile Dysfunction Agents Failed - 05/05/2021  4:52 PM      Failed - Last BP in normal range    BP Readings from Last 1 Encounters:  11/14/20 (!) 161/94         Passed - AST in normal range and within 360 days    AST  Date Value Ref Range Status  01/10/2021 38 0 - 40 IU/L Final  12/12/2014 24 U/L Final         Passed - ALT in normal range and within 360 days    ALT  Date Value Ref Range Status  01/10/2021 24 0 - 44 IU/L Final  09/27/2015 17 9 - 46 U/L Final         Passed - Valid encounter within last 12 months    Recent Outpatient Visits          4 months ago Medication refill   [facility] [place], Shea Stakes, NP   1 year ago Dysuria   [facility] [place], Valetta Mole, MD   1 year ago Essential hypertension   [facility] Jonah Blue B, MD   2 years ago Spinal stenosis of lumbar region with neurogenic claudication   [facility] Fulp, [place], MD   2 years ago Controlled type 2 diabetes mellitus with diabetic neuropathy, without long-term current use of insulin (HCC)   [facility] [place], [place], MD

## 2021-05-13 ENCOUNTER — Encounter: Payer: Self-pay | Admitting: Nurse Practitioner

## 2021-05-28 ENCOUNTER — Other Ambulatory Visit: Payer: Self-pay | Admitting: Nurse Practitioner

## 2021-05-28 DIAGNOSIS — I10 Essential (primary) hypertension: Secondary | ICD-10-CM

## 2021-05-28 NOTE — Telephone Encounter (Signed)
Copied from CRM [id number]. Topic: Quick Communication - Rx Refill/Question ?>> May 28, 2021  3:55 PM Gaetana Michaelis A wrote: ?Medication: sildenafil (REVATIO) 20 MG tablet [[id number]]  ? ?atenolol (TENORMIN) 25 MG tablet [[id number]]  ? ?Has the patient contacted their pharmacy? No. ?(Agent: If no, request that the patient contact the pharmacy for the refill. If patient does not wish to contact the pharmacy document the reason why and proceed with request.) ?(Agent: If yes, when and what did the pharmacy advise?) ? ?Preferred Pharmacy (with phone number or street name): [facility] - [place] ([place]), [place] - [address] ?[address] [place] ([place]) [place] [address] ?Phone: [phone] Fax: [phone] ?Hours: Not open 24 hours ? ? ?Has the patient been seen for an appointment in the last year OR does the patient have an upcoming appointment? Yes.   ? ?Agent: Please be advised that RX refills may take up to 3 business days. We ask that you follow-up with your pharmacy. ?

## 2021-05-29 MED ORDER — ATENOLOL 25 MG PO TABS: 25.0000 mg | ORAL_TABLET | Freq: Every day | ORAL | 0 refills | Status: DC

## 2021-05-29 MED ORDER — SILDENAFIL CITRATE 20 MG PO TABS: ORAL_TABLET | ORAL | 0 refills | Status: DC

## 2021-05-29 NOTE — Telephone Encounter (Signed)
Requested Prescriptions  ?Pending Prescriptions Disp Refills  ?? sildenafil (REVATIO) 20 MG tablet 20 tablet 0  ?  Sig: TAKE 3 TABLETS BY MOUTH 1/2 HOUR PRIOR TO INTERCOURSE. LIMIT USE TO 3 TABS/ 24 HOURS  ?  ? Urology: Erectile Dysfunction Agents Failed - 05/28/2021  4:59 PM  ?  ?  Failed - Last BP in normal range  ?  BP Readings from Last 1 Encounters:  ?11/14/20 (!) 161/94  ?   ?  ?  Passed - AST in normal range and within 360 days  ?  AST  ?Date Value Ref Range Status  ?01/10/2021 38 0 - 40 IU/L Final  ?12/12/2014 24 U/L Final  ?   ?  ?  Passed - ALT in normal range and within 360 days  ?  ALT  ?Date Value Ref Range Status  ?01/10/2021 24 0 - 44 IU/L Final  ?09/27/2015 17 9 - 46 U/L Final  ?   ?  ?  Passed - Valid encounter within last 12 months  ?  Recent Outpatient Visits   ?      ? 5 months ago Medication refill  ? [facility] [place], Shea Stakes, NP  ? 1 year ago Dysuria  ? [facility] [place], Valetta Mole, MD  ? 1 year ago Essential hypertension  ? [facility] Jonah Blue B, MD  ? 2 years ago Spinal stenosis of lumbar region with neurogenic claudication  ? [facility] Fulp, [place], MD  ? 2 years ago Controlled type 2 diabetes mellitus with diabetic neuropathy, without long-term current use of insulin (HCC)  ? [facility] [facility] Fulp, [place], MD  ?  ?  ? ?  ?  ?  ?? atenolol (TENORMIN) 25 MG tablet 90 tablet 0  ?  Sig: Take 1 tablet (25 mg total) by mouth daily.  ?  ? Cardiovascular: Beta Blockers 2 Failed - 05/28/2021  4:59 PM  ?  ?  Failed - Last BP in normal range  ?  BP Readings from Last 1 Encounters:  ?11/14/20 (!) 161/94  ?   ?  ?  Passed - Cr in normal range and within 360 days  ?  Creat  ?Date Value Ref Range Status  ?01/11/2019 1.12 0.70 - 1.33 mg/dL Final  ?  Comment:  ?  For patients >76 years of age, the reference limit ?for Creatinine is approximately 13%  higher for people ?identified as African-American. ?. ?  ? ?Creatinine, Ser  ?Date Value Ref Range Status  ?01/10/2021 1.05 0.76 - 1.27 mg/dL Final  ?   ?  ?  Passed - Last Heart Rate in normal range  ?  Pulse Readings from Last 1 Encounters:  ?11/14/20 98  ?   ?  ?  Passed - Valid encounter within last 6 months  ?  Recent Outpatient Visits   ?      ? 5 months ago Medication refill  ? [facility] [place], Shea Stakes, NP  ? 1 year ago Dysuria  ? [facility] Swords, Valetta Mole, MD  ? 1 year ago Essential hypertension  ? [facility] Jonah Blue B, MD  ? 2 years ago Spinal stenosis of lumbar region with neurogenic claudication  ? [facility] Fulp, [place], MD  ? 2 years  ago Controlled type 2 diabetes mellitus with diabetic neuropathy, without long-term current use of insulin (HCC)  ? [facility] Fulp, Hewitt Shorts, MD  ?  ?  ? ?  ?  ?  ? ? ?

## 2021-06-07 ENCOUNTER — Other Ambulatory Visit: Payer: Self-pay

## 2021-06-07 ENCOUNTER — Ambulatory Visit (HOSPITAL_COMMUNITY): Payer: Medicaid Other | Admitting: Psychiatry

## 2021-06-26 ENCOUNTER — Other Ambulatory Visit: Payer: Self-pay | Admitting: Nurse Practitioner

## 2021-06-26 NOTE — Telephone Encounter (Signed)
Medication Refill - Medication: sildenafil (REVATIO) 20 MG tablet [[id number] ? ?Has the patient contacted their pharmacy? Yes.   ?Cant get ahold of them  ? ?Preferred Pharmacy (with phone number or street name):  ?[facility] - [place] ([place]), [place] - [address] Phone:  [phone]  ?Fax:  [phone]  ?  ? ?Has the patient been seen for an appointment in the last year OR does the patient have an upcoming appointment? Yes.   ? ?Agent: Please be advised that RX refills may take up to 3 business days. We ask that you follow-up with your pharmacy. ? ?

## 2021-06-27 MED ORDER — SILDENAFIL CITRATE 20 MG PO TABS: ORAL_TABLET | ORAL | 0 refills | Status: DC

## 2021-06-27 NOTE — Telephone Encounter (Signed)
Requested Prescriptions  ?Pending Prescriptions Disp Refills  ?? sildenafil (REVATIO) 20 MG tablet 20 tablet 0  ?  Sig: TAKE 3 TABLETS BY MOUTH 1/2 HOUR PRIOR TO INTERCOURSE. LIMIT USE TO 3 TABS/ 24 HOURS  ?  ? Urology: Erectile Dysfunction Agents Failed - 06/26/2021 12:25 PM  ?  ?  Failed - Last BP in normal range  ?  BP Readings from Last 1 Encounters:  ?11/14/20 (!) 161/94  ?   ?  ?  Passed - AST in normal range and within 360 days  ?  AST  ?Date Value Ref Range Status  ?01/10/2021 38 0 - 40 IU/L Final  ?12/12/2014 24 U/L Final  ?   ?  ?  Passed - ALT in normal range and within 360 days  ?  ALT  ?Date Value Ref Range Status  ?01/10/2021 24 0 - 44 IU/L Final  ?09/27/2015 17 9 - 46 U/L Final  ?   ?  ?  Passed - Valid encounter within last 12 months  ?  Recent Outpatient Visits   ?      ? 6 months ago Medication refill  ? [facility] [place], Shea Stakes, NP  ? 1 year ago Dysuria  ? [facility] Swords, Valetta Mole, MD  ? 1 year ago Essential hypertension  ? [facility] Jonah Blue B, MD  ? 2 years ago Spinal stenosis of lumbar region with neurogenic claudication  ? [facility]ness Fulp, [place], MD  ? 2 years ago Controlled type 2 diabetes mellitus with diabetic neuropathy, without long-term current use of insulin (HCC)  ? [facility] Fulp, Hewitt Shorts, MD  ?  ?  ? ?  ?  ?  ? ? ?

## 2021-07-12 ENCOUNTER — Other Ambulatory Visit: Payer: Self-pay | Admitting: Nurse Practitioner

## 2021-07-12 NOTE — Telephone Encounter (Signed)
Medication Refill - Medication: sildenafil (REVATIO) 20 MG tablet ? ?Has the patient contacted their pharmacy? Yes.   ? ?Preferred Pharmacy (with phone number or street name):  ?[facility] - [place] ([place]), [place] - [address] Phone:  [phone]  ?Fax:  [phone]  ?  ? ?Has the patient been seen for an appointment in the last year OR does the patient have an upcoming appointment? Yes.   ? ?Agent: Please be advised that RX refills may take up to 3 business days. We ask that you follow-up with your pharmacy. ? ?

## 2021-07-16 MED ORDER — SILDENAFIL CITRATE 20 MG PO TABS: ORAL_TABLET | ORAL | 0 refills | Status: DC

## 2021-07-16 NOTE — Telephone Encounter (Signed)
Requested Prescriptions  ?Pending Prescriptions Disp Refills  ?? sildenafil (REVATIO) 20 MG tablet 20 tablet 0  ?  Sig: TAKE 3 TABLETS BY MOUTH 1/2 HOUR PRIOR TO INTERCOURSE. LIMIT USE TO 3 TABS/ 24 HOURS  ?  ? Urology: Erectile Dysfunction Agents Failed - 07/13/2021  1:02 PM  ?  ?  Failed - Last BP in normal range  ?  BP Readings from Last 1 Encounters:  ?11/14/20 (!) 161/94  ?   ?  ?  Passed - AST in normal range and within 360 days  ?  AST  ?Date Value Ref Range Status  ?01/10/2021 38 0 - 40 IU/L Final  ?12/12/2014 24 U/L Final  ?   ?  ?  Passed - ALT in normal range and within 360 days  ?  ALT  ?Date Value Ref Range Status  ?01/10/2021 24 0 - 44 IU/L Final  ?09/27/2015 17 9 - 46 U/L Final  ?   ?  ?  Passed - Valid encounter within last 12 months  ?  Recent Outpatient Visits   ?      ? 6 months ago Medication refill  ? [facility] [place], Shea Stakes, NP  ? 1 year ago Dysuria  ? [facility] Swords, Valetta Mole, MD  ? 1 year ago Essential hypertension  ? [facility] Jonah Blue B, MD  ? 2 years ago Spinal stenosis of lumbar region with neurogenic claudication  ? [facility] And Wellness Fulp, [place], MD  ? 2 years ago Controlled type 2 diabetes mellitus with diabetic neuropathy, without long-term current use of insulin (HCC)  ? [facility] Fulp, Hewitt Shorts, MD  ?  ?  ? ?  ?  ?  ? ? ?

## 2021-07-31 ENCOUNTER — Other Ambulatory Visit: Payer: Self-pay | Admitting: Nurse Practitioner

## 2021-07-31 DIAGNOSIS — J449 Chronic obstructive pulmonary disease, unspecified: Secondary | ICD-10-CM

## 2021-07-31 NOTE — Telephone Encounter (Signed)
Medication Refill - Medication:  ? ?sildenafil (REVATIO) 20 MG tablet ? ?albuterol (VENTOLIN HFA) 108 (90 Base) MCG/ACT inhaler ? ?Has the patient contacted their pharmacy? Yes.  No refills left.  ?(Agent: If no, request that the patient contact the pharmacy for the refill. If patient does not wish to contact the pharmacy document the reason why and proceed with request.) ?(Agent: If yes, when and what did the pharmacy advise?) ? ?Preferred Pharmacy (with phone number or street name):  ? ?[facility] - [place] ([place] - [address]  ?[address] [place] ([place])  [address]  ?Phone: [phone] Fax: [phone]  ? ?Has the patient been seen for an appointment in the last year OR does the patient have an upcoming appointment? Yes.   ? ?Agent: Please be advised that RX refills may take up to 3 business days. We ask that you follow-up with your pharmacy. ? ?

## 2021-08-01 MED ORDER — SILDENAFIL CITRATE 20 MG PO TABS: ORAL_TABLET | ORAL | 0 refills | Status: DC

## 2021-08-01 MED ORDER — ALBUTEROL SULFATE HFA 108 (90 BASE) MCG/ACT IN AERS: 2.0000 | INHALATION_SPRAY | Freq: Four times a day (QID) | RESPIRATORY_TRACT | 3 refills | Status: DC | PRN

## 2021-08-01 NOTE — Telephone Encounter (Signed)
Requested Prescriptions  ?Pending Prescriptions Disp Refills  ?? albuterol (VENTOLIN HFA) 108 (90 Base) MCG/ACT inhaler 6.7 g 3  ?  Sig: Inhale 2 puffs into the lungs every 6 (six) hours as needed for wheezing or shortness of breath.  ?  ? Pulmonology:  Beta Agonists 2 Failed - 07/31/2021  5:28 PM  ?  ?  Failed - Last BP in normal range  ?  BP Readings from Last 1 Encounters:  ?11/14/20 (!) 161/94  ?   ?  ?  Passed - Last Heart Rate in normal range  ?  Pulse Readings from Last 1 Encounters:  ?11/14/20 98  ?   ?  ?  Passed - Valid encounter within last 12 months  ?  Recent Outpatient Visits   ?      ? 7 months ago Medication refill  ? [facility] [place], Shea Stakes, NP  ? 1 year ago Dysuria  ? [facility] And Wellness Swords, Valetta Mole, MD  ? 1 year ago Essential hypertension  ? [facility] And Wellness Jonah Blue B, MD  ? 2 years ago Spinal stenosis of lumbar region with neurogenic claudication  ? [facility] [facility]ness Fulp, [place], MD  ? 2 years ago Controlled type 2 diabetes mellitus with diabetic neuropathy, without long-term current use of insulin (HCC)  ? [place] And Wellness Fulp, [place], MD  ?  ?  ? ?  ?  ?  ?? sildenafil (REVATIO) 20 MG tablet 20 tablet 0  ?  Sig: TAKE 3 TABLETS BY MOUTH 1/2 HOUR PRIOR TO INTERCOURSE. LIMIT USE TO 3 TABS/ 24 HOURS  ?  ? Urology: Erectile Dysfunction Agents Failed - 07/31/2021  5:28 PM  ?  ?  Failed - Last BP in normal range  ?  BP Readings from Last 1 Encounters:  ?11/14/20 (!) 161/94  ?   ?  ?  Passed - AST in normal range and within 360 days  ?  AST  ?Date Value Ref Range Status  ?01/10/2021 38 0 - 40 IU/L Final  ?12/12/2014 24 U/L Final  ?   ?  ?  Passed - ALT in normal range and within 360 days  ?  ALT  ?Date Value Ref Range Status  ?01/10/2021 24 0 - 44 IU/L Final  ?09/27/2015 17 9 - 46 U/L Final  ?   ?  ?  Passed - Valid encounter within last 12 months  ?  Recent  Outpatient Visits   ?      ? 7 months ago Medication refill  ? [facility] [place], Shea Stakes, NP  ? 1 year ago Dysuria  ? [facility] And Wellness Swords, Valetta Mole, MD  ? 1 year ago Essential hypertension  ? [facility] Jonah Blue B, MD  ? 2 years ago Spinal stenosis of lumbar region with neurogenic claudication  ? [facility] Fulp, [place], MD  ? 2 years ago Controlled type 2 diabetes mellitus with diabetic neuropathy, without long-term current use of insulin (HCC)  ? [facility]ness Fulp, Hewitt Shorts, MD  ?  ?  ? ?  ?  ?  ? ?

## 2021-08-17 ENCOUNTER — Other Ambulatory Visit: Payer: Self-pay | Admitting: Nurse Practitioner

## 2021-08-17 DIAGNOSIS — K219 Gastro-esophageal reflux disease without esophagitis: Secondary | ICD-10-CM

## 2021-08-17 MED ORDER — OMEPRAZOLE 20 MG PO CPDR: 20.0000 mg | DELAYED_RELEASE_CAPSULE | Freq: Two times a day (BID) | ORAL | 2 refills | Status: DC

## 2021-08-17 MED ORDER — SILDENAFIL CITRATE 20 MG PO TABS: ORAL_TABLET | ORAL | 0 refills | Status: DC

## 2021-08-17 NOTE — Telephone Encounter (Signed)
Requested Prescriptions  Pending Prescriptions Disp Refills  . sildenafil (REVATIO) 20 MG tablet 20 tablet 0    Sig: TAKE 3 TABLETS BY MOUTH 1/2 HOUR PRIOR TO INTERCOURSE. LIMIT USE TO 3 TABS/ 24 HOURS     Urology: Erectile Dysfunction Agents Failed - 08/17/2021  1:32 PM      Failed - Last BP in normal range    BP Readings from Last 1 Encounters:  11/14/20 (!) 161/94         Passed - AST in normal range and within 360 days    AST  Date Value Ref Range Status  01/10/2021 38 0 - 40 IU/L Final  12/12/2014 24 U/L Final         Passed - ALT in normal range and within 360 days    ALT  Date Value Ref Range Status  01/10/2021 24 0 - 44 IU/L Final  09/27/2015 17 9 - 46 U/L Final         Passed - Valid encounter within last 12 months    Recent Outpatient Visits          7 months ago Medication refill   [facility] [place], Shea Stakes, NP   1 year ago Dysuria   [place] [facility] Swords, Valetta Mole, MD   1 year ago Essential hypertension   [place] [facility] Marcine Matar, MD   2 years ago Spinal stenosis of lumbar region with neurogenic claudication   [place] [facility] Fulp, [place], MD   2 years ago Controlled type 2 diabetes mellitus with diabetic neuropathy, without long-term current use of insulin (HCC)   [place] [facility] Fulp, [place], MD             . omeprazole (PRILOSEC) 20 MG capsule 180 capsule 2    Sig: Take 1 capsule (20 mg total) by mouth 2 (two) times daily before a meal. DX:  K21.9 GERD     Gastroenterology: Proton Pump Inhibitors Passed - 08/17/2021  1:32 PM      Passed - Valid encounter within last 12 months    Recent Outpatient Visits          7 months ago Medication refill   [facility] [place], Shea Stakes, NP   1 year ago Dysuria   [place] [facility] Swords, Valetta Mole, MD   1 year ago  Essential hypertension   [place] [facility] Jonah Blue B, MD   2 years ago Spinal stenosis of lumbar region with neurogenic claudication   [place] [facility] Fulp, [place], MD   2 years ago Controlled type 2 diabetes mellitus with diabetic neuropathy, without long-term current use of insulin (HCC)   [place] [facility] [place], [place], MD

## 2021-08-17 NOTE — Telephone Encounter (Signed)
Medication Refill - Medication:  sildenafil (REVATIO) 20 MG tablet  omeprazole (PRILOSEC) 20 MG capsule   Has the patient contacted their pharmacy? Yes.   Contact PCP  Preferred Pharmacy (with phone number or street name):  [facility] - [place] ([place] - [address]  [address], [place] ([place]) [place] [address]  Phone:  [phone]  Fax:  [phone]   Has the patient been seen for an appointment in the last year OR does the patient have an upcoming appointment? Yes.    Agent: Please be advised that RX refills may take up to 3 business days. We ask that you follow-up with your pharmacy.

## 2021-09-03 ENCOUNTER — Other Ambulatory Visit: Payer: Self-pay | Admitting: Primary Care

## 2021-09-05 ENCOUNTER — Other Ambulatory Visit: Payer: Self-pay | Admitting: Primary Care

## 2021-09-07 ENCOUNTER — Telehealth: Payer: Self-pay | Admitting: Nurse Practitioner

## 2021-09-07 MED ORDER — SILDENAFIL CITRATE 20 MG PO TABS: ORAL_TABLET | ORAL | 0 refills | Status: DC

## 2021-09-07 NOTE — Telephone Encounter (Signed)
Rx sent 

## 2021-09-07 NOTE — Telephone Encounter (Signed)
Can you help me with this pls ?

## 2021-09-27 ENCOUNTER — Other Ambulatory Visit: Payer: Self-pay | Admitting: Nurse Practitioner

## 2021-09-27 DIAGNOSIS — R300 Dysuria: Secondary | ICD-10-CM

## 2021-09-27 MED ORDER — TAMSULOSIN HCL 0.4 MG PO CAPS: 0.4000 mg | ORAL_CAPSULE | Freq: Two times a day (BID) | ORAL | 0 refills | Status: DC

## 2021-09-27 MED ORDER — SILDENAFIL CITRATE 20 MG PO TABS: ORAL_TABLET | ORAL | 0 refills | Status: DC

## 2021-09-27 NOTE — Telephone Encounter (Signed)
Sildenafil Rx 09/07/21 #20- will RF Requested Prescriptions  Pending Prescriptions Disp Refills  . sildenafil (REVATIO) 20 MG tablet 20 tablet 0    Sig: TAKE 3 TABLETS BY MOUTH 1/2 HOUR PRIOR TO INTERCOURSE. LIMIT USE TO 3 TABS/ 24 HOURS     Urology: Erectile Dysfunction Agents Failed - 09/27/2021  1:47 PM      Failed - Last BP in normal range    BP Readings from Last 1 Encounters:  11/14/20 (!) 161/94         Passed - AST in normal range and within 360 days    AST  Date Value Ref Range Status  01/10/2021 38 0 - 40 IU/L Final  12/12/2014 24 U/L Final         Passed - ALT in normal range and within 360 days    ALT  Date Value Ref Range Status  01/10/2021 24 0 - 44 IU/L Final  09/27/2015 17 9 - 46 U/L Final         Passed - Valid encounter within last 12 months    Recent Outpatient Visits          9 months ago Medication refill   [facility] [place], Shea Stakes, NP   1 year ago Dysuria   [place] [facility] Swords, Valetta Mole, MD   1 year ago Essential hypertension   [place] [facility] Jonah Blue B, MD   2 years ago Spinal stenosis of lumbar region with neurogenic claudication   [place] [facility] Fulp, [place], MD   2 years ago Controlled type 2 diabetes mellitus with diabetic neuropathy, without long-term current use of insulin (HCC)   [place] [facility] Cain Saupe, MD      Future Appointments            In 2 months Claiborne Rigg, NP [facility]           . tamsulosin (FLOMAX) 0.4 MG CAPS capsule 180 capsule 2    Sig: Take 1 capsule (0.4 mg total) by mouth 2 (two) times daily.     Urology: Alpha-Adrenergic Blocker Failed - 09/27/2021  1:47 PM      Failed - PSA in normal range and within 360 days    PSA  Date Value Ref Range Status  07/28/2015 0.31 0.10 - 4.00 ng/mL Final         Failed - Last BP in normal  range    BP Readings from Last 1 Encounters:  11/14/20 (!) 161/94         Passed - Valid encounter within last 12 months    Recent Outpatient Visits          9 months ago Medication refill   [facility] [place], Shea Stakes, NP   1 year ago Dysuria   [place] [facility] Swords, Valetta Mole, MD   1 year ago Essential hypertension   [place] [facility] Jonah Blue B, MD   2 years ago Spinal stenosis of lumbar region with neurogenic claudication   [place] [facility] Fulp, [place], MD   2 years ago Controlled type 2 diabetes mellitus with diabetic neuropathy, without long-term current use of insulin (HCC)   [place] [facility] Cain Saupe, MD      Future Appointments  In 2 months Claiborne Rigg, NP [facility]

## 2021-09-27 NOTE — Telephone Encounter (Signed)
Requested medication (s) are due for refill today - yes  Requested medication (s) are on the active medication list -yes  Future visit scheduled -yes  Last refill: 01/11/21 #180 2RF  Notes to clinic: Fails lab protocol- over 1 year  Requested Prescriptions  Pending Prescriptions Disp Refills   tamsulosin (FLOMAX) 0.4 MG CAPS capsule 180 capsule 2    Sig: Take 1 capsule (0.4 mg total) by mouth 2 (two) times daily.     Urology: Alpha-Adrenergic Blocker Failed - 09/27/2021  1:47 PM      Failed - PSA in normal range and within 360 days    PSA  Date Value Ref Range Status  07/28/2015 0.31 0.10 - 4.00 ng/mL Final         Failed - Last BP in normal range    BP Readings from Last 1 Encounters:  11/14/20 (!) 161/94         Passed - Valid encounter within last 12 months    Recent Outpatient Visits           9 months ago Medication refill   [facility] [place], Shea Stakes, NP   1 year ago Dysuria   [place] [facility] Swords, Valetta Mole, MD   1 year ago Essential hypertension   [place] [facility] Jonah Blue B, MD   2 years ago Spinal stenosis of lumbar region with neurogenic claudication   [place] [facility] Fulp, [place], MD   2 years ago Controlled type 2 diabetes mellitus with diabetic neuropathy, without long-term current use of insulin (HCC)   [place] [facility] [place], [place], MD       Future Appointments             In 2 months Claiborne Rigg, NP [place] [facility]            Signed Prescriptions Disp Refills   sildenafil (REVATIO) 20 MG tablet 20 tablet 0    Sig: TAKE 3 TABLETS BY MOUTH 1/2 HOUR PRIOR TO INTERCOURSE. LIMIT USE TO 3 TABS/ 24 HOURS     Urology: Erectile Dysfunction Agents Failed - 09/27/2021  1:47 PM      Failed - Last BP in normal range    BP Readings from Last 1 Encounters:  11/14/20 (!) 161/94          Passed - AST in normal range and within 360 days    AST  Date Value Ref Range Status  01/10/2021 38 0 - 40 IU/L Final  12/12/2014 24 U/L Final         Passed - ALT in normal range and within 360 days    ALT  Date Value Ref Range Status  01/10/2021 24 0 - 44 IU/L Final  09/27/2015 17 9 - 46 U/L Final         Passed - Valid encounter within last 12 months    Recent Outpatient Visits           9 months ago Medication refill   [facility] [facility] [place], Shea Stakes, NP   1 year ago Dysuria   [place] [facility] Swords, Valetta Mole, MD   1 year ago Essential hypertension   [place] [facility] Jonah Blue B, MD   2 years ago Spinal stenosis of lumbar region with neurogenic claudication   [facility] Fulp, [place]amm[place], MD   2 years ago Controlled type 2 diabetes mellitus with diabetic neuropathy, without long-term current use of insulin (HCC)   [place] [facility] [place], [place], MD       Future Appointments             In 2 months Claiborne Rigg, NP [place] [facility]               Requested Prescriptions  Pending Prescriptions Disp Refills   tamsulosin (FLOMAX) 0.4 MG CAPS capsule 180 capsule 2    Sig: Take 1 capsule (0.4 mg total) by mouth 2 (two) times daily.     Urology: Alpha-Adrenergic Blocker Failed - 09/27/2021  1:47 PM      Failed - PSA in normal range and within 360 days    PSA  Date Value Ref Range Status  07/28/2015 0.31 0.10 - 4.00 ng/mL Final         Failed - Last BP in normal range    BP Readings from Last 1 Encounters:  11/14/20 (!) 161/94         Passed - Valid encounter within last 12 months    Recent Outpatient Visits           9 months ago Medication refill   [facility] [place], Shea Stakes, NP   1 year ago Dysuria   [place] [facility] Swords, Valetta Mole, MD   1 year ago Essential hypertension   [place] [facility] Jonah Blue B, MD   2 years ago Spinal stenosis of lumbar region with neurogenic claudication   [place] [facility] Fulp, [place], MD   2 years ago Controlled type 2 diabetes mellitus with diabetic neuropathy, without long-term current use of insulin (HCC)   [place] [facility] [place], [place], MD       Future Appointments             In 2 months Claiborne Rigg, NP [place] [facility]            Signed Prescriptions Disp Refills   sildenafil (REVATIO) 20 MG tablet 20 tablet 0    Sig: TAKE 3 TABLETS BY MOUTH 1/2 HOUR PRIOR TO INTERCOURSE. LIMIT USE TO 3 TABS/ 24 HOURS     Urology: Erectile Dysfunction Agents Failed - 09/27/2021  1:47 PM      Failed - Last BP in normal range    BP Readings from Last 1 Encounters:  11/14/20 (!) 161/94         Passed - AST in normal range and within 360 days    AST  Date Value Ref Range Status  01/10/2021 38 0 - 40 IU/L Final  12/12/2014 24 U/L Final         Passed - ALT in normal range and within 360 days    ALT  Date Value Ref Range Status  01/10/2021 24 0 - 44 IU/L Final  09/27/2015 17 9 - 46 U/L Final         Passed - Valid encounter within last 12 months    Recent Outpatient Visits           9 months ago Medication refill   [facility] [place], Shea Stakes, NP   1 year ago Dysuria   [facility]  Swords, Valetta Mole, MD   1 year ago Essential hypertension   [place] [facility] Jonah Blue B, MD   2 years ago Spinal stenosis of lumbar region with neurogenic claudication   [place] [facility] Fulp, [place], MD   2 years ago Controlled type 2 diabetes mellitus with diabetic neuropathy, without long-term current use of insulin (HCC)   [place] [facility] Cain Saupe, MD       Future Appointments             In 2 months Claiborne Rigg, NP [facility]

## 2021-09-27 NOTE — Telephone Encounter (Signed)
Copied from CRM [id number]. Topic: General - Other >> Sep 27, 2021 12:37 PM Everette C wrote: Reason for CRM: Medication Refill - Medication: sildenafil (REVATIO) 20 MG tablet [[id number]]   tamsulosin (FLOMAX) 0.4 MG CAPS capsule [[id number]]   Has the patient contacted their pharmacy? No. The patient has attempted, but been unable to reach their pharmacy successfully  (Agent: If no, request that the patient contact the pharmacy for the refill. If patient does not wish to contact the pharmacy document the reason why and proceed with request.) (Agent: If yes, when and what did the pharmacy advise?)  Preferred Pharmacy (with phone number or street name): [id number] - [place] ([place]), [place] - [address] [place]) [place] [address] Phone: [phone] Fax: [phone] Hours: Not open 24 hours  Has the patient been seen for an appointment in the last year OR does the patient have an upcoming appointment? Yes.    Agent: Please be advised that RX refills may take up to 3 business days. We ask that you follow-up with your pharmacy.

## 2021-10-18 ENCOUNTER — Telehealth: Payer: Self-pay | Admitting: Nurse Practitioner

## 2021-10-18 NOTE — Telephone Encounter (Signed)
Medication Refill - Medication: sildenafil (REVATIO) 20 MG tablet / pt gets 20 pills but it would be better if he could get 30 pills for the same price and to last longer  / please advise if tabs can increase to 30 instead of 20   Has the patient contacted their pharmacy? Yes.   (Agent: If no, request that the patient contact the pharmacy for the refill. If patient does not wish to contact the pharmacy document the reason why and proceed with request.) (Agent: If yes, when and what did the pharmacy advise?) Call pcp Preferred Pharmacy (with phone number or street name):  Has the patient been seen for an appointment in the last year OR does the patient have an upcoming appointment? Yes.    Agent: Please be advised that RX refills may take up to 3 business days. We ask that you follow-up with your pharmacy.

## 2021-10-19 ENCOUNTER — Other Ambulatory Visit: Payer: Self-pay | Admitting: Nurse Practitioner

## 2021-10-19 ENCOUNTER — Other Ambulatory Visit: Payer: Self-pay | Admitting: Family Medicine

## 2021-10-19 DIAGNOSIS — R300 Dysuria: Secondary | ICD-10-CM

## 2021-10-19 MED ORDER — SILDENAFIL CITRATE 20 MG PO TABS: ORAL_TABLET | ORAL | 3 refills | Status: DC

## 2021-10-19 NOTE — Telephone Encounter (Signed)
Requested medications are due for refill today.  yes  Requested medications are on the active medications list.  yes  Last refill. 09/27/2021 #60 0 refills  Future visit scheduled.   yes  Notes to clinic.  Labs are expired.    Requested Prescriptions  Pending Prescriptions Disp Refills   tamsulosin (FLOMAX) 0.4 MG CAPS capsule [Pharmacy Med Name: Tamsulosin HCl 0.4 MG Oral Capsule] 60 capsule 0    Sig: Take 1 capsule by mouth twice daily     Urology: Alpha-Adrenergic Blocker Failed - 10/19/2021 10:31 AM      Failed - PSA in normal range and within 360 days    PSA  Date Value Ref Range Status  07/28/2015 0.31 0.10 - 4.00 ng/mL Final         Failed - Last BP in normal range    BP Readings from Last 1 Encounters:  11/14/20 (!) 161/94         Passed - Valid encounter within last 12 months    Recent Outpatient Visits           9 months ago Medication refill   [facility] [place], Shea Stakes, NP   1 year ago Dysuria   [place] [facility] Swords, Valetta Mole, MD   2 years ago Essential hypertension   [place] [facility] Jonah Blue B, MD   2 years ago Spinal stenosis of lumbar region with neurogenic claudication   [place] [facility] Fulp, [place], MD   2 years ago Controlled type 2 diabetes mellitus with diabetic neuropathy, without long-term current use of insulin (HCC)    [facility] Fulp, [place], MD       Future Appointments             In 1 month Claiborne Rigg, NP [facility]

## 2021-10-23 ENCOUNTER — Telehealth: Payer: Self-pay | Admitting: Emergency Medicine

## 2021-10-23 NOTE — Telephone Encounter (Signed)
Copied from CRM [id number]. Topic: General - Other >> Oct 19, 2021  4:24 PM Everette C wrote: Reason for CRM: The patient has called to follow up on their previous request for sildenafil (REVATIO) 20 MG tablet [[id number]]   The patient has concerns about their quantity and would like to discuss further   Please contact further when possible

## 2021-10-23 NOTE — Telephone Encounter (Signed)
Pt will need to contact his pharmacy for questions about quantity.I informed him of this.

## 2021-12-07 ENCOUNTER — Ambulatory Visit: Payer: Medicaid Other | Admitting: Nurse Practitioner

## 2021-12-09 ENCOUNTER — Other Ambulatory Visit: Payer: Self-pay | Admitting: Nurse Practitioner

## 2021-12-09 DIAGNOSIS — R300 Dysuria: Secondary | ICD-10-CM

## 2021-12-10 NOTE — Telephone Encounter (Signed)
Requested medication (s) are due for refill today - yes  Requested medication (s) are on the active medication list -yes  Future visit scheduled -no  Last refill: 10/19/21 #60  Notes to clinic: fails lab protocol- over 1 year-2017  Requested Prescriptions  Pending Prescriptions Disp Refills   tamsulosin (FLOMAX) 0.4 MG CAPS capsule [Pharmacy Med Name: Tamsulosin HCl 0.4 MG Oral Capsule] 60 capsule 0    Sig: Take 1 capsule by mouth twice daily     Urology: Alpha-Adrenergic Blocker Failed - 12/09/2021  8:49 AM      Failed - PSA in normal range and within 360 days    PSA  Date Value Ref Range Status  07/28/2015 0.31 0.10 - 4.00 ng/mL Final         Failed - Last BP in normal range    BP Readings from Last 1 Encounters:  11/14/20 (!) 161/94         Passed - Valid encounter within last 12 months    Recent Outpatient Visits           11 months ago Medication refill   [place], Vernia Buff, NP   1 year ago [place] Swords, Darrick Penna, MD   2 years ago Essential hypertension   [place], Neoma Laming B, MD   2 years ago Spinal stenosis of lumbar region with neurogenic claudication   [place] Fulp, [place], MD   3 years ago Controlled type 2 diabetes mellitus with diabetic neuropathy, without long-term current use of insulin ([place])   [place], MD                 Requested Prescriptions  Pending Prescriptions Disp Refills   tamsulosin (FLOMAX) 0.4 MG CAPS capsule [Pharmacy Med Name: Tamsulosin HCl 0.4 MG Oral Capsule] 60 capsule 0    Sig: Take 1 capsule by mouth twice daily     Urology: Alpha-Adrenergic Blocker Failed - 12/09/2021  8:49 AM      Failed - PSA in normal range and within 360 days    PSA  Date Value Ref Range Status  07/28/2015 0.31 0.10 - 4.00 ng/mL Final          Failed - Last BP in normal range    BP Readings from Last 1 Encounters:  11/14/20 (!) 161/94         Passed - Valid encounter within last 12 months    Recent Outpatient Visits           11 months ago Medication refill   [place], Vernia Buff, NP   1 year ago Dysuria   [place] [facility] Swords, Darrick Penna, MD   2 years ago Essential hypertension   [place] Karle Plumber B, MD   2 years ago Spinal stenosis of lumbar region with neurogenic claudication   [place], MD   3 years ago Controlled type 2 diabetes mellitus with diabetic neuropathy, without long-term current use of insulin ([place])    [facility] [place], [place], MD

## 2023-01-03 ENCOUNTER — Other Ambulatory Visit: Payer: Self-pay

## 2023-01-03 ENCOUNTER — Encounter (HOSPITAL_BASED_OUTPATIENT_CLINIC_OR_DEPARTMENT_OTHER): Payer: Self-pay | Admitting: Emergency Medicine

## 2023-01-03 ENCOUNTER — Emergency Department (HOSPITAL_BASED_OUTPATIENT_CLINIC_OR_DEPARTMENT_OTHER)
Admission: EM | Admit: 2023-01-03 | Discharge: 2023-01-03 | Disposition: A | Discharge status: Home / Self Care | Payer: Medicaid Other | Attending: Emergency Medicine | Admitting: Emergency Medicine

## 2023-01-03 DIAGNOSIS — I10 Essential (primary) hypertension: Secondary | ICD-10-CM | POA: Insufficient documentation

## 2023-01-03 DIAGNOSIS — Z79899 Other long term (current) drug therapy: Secondary | ICD-10-CM | POA: Insufficient documentation

## 2023-01-03 DIAGNOSIS — Z7951 Long term (current) use of inhaled steroids: Secondary | ICD-10-CM | POA: Insufficient documentation

## 2023-01-03 DIAGNOSIS — Z23 Encounter for immunization: Secondary | ICD-10-CM | POA: Insufficient documentation

## 2023-01-03 DIAGNOSIS — J449 Chronic obstructive pulmonary disease, unspecified: Secondary | ICD-10-CM | POA: Insufficient documentation

## 2023-01-03 DIAGNOSIS — E119 Type 2 diabetes mellitus without complications: Secondary | ICD-10-CM | POA: Insufficient documentation

## 2023-01-03 DIAGNOSIS — L299 Pruritus, unspecified: Secondary | ICD-10-CM | POA: Insufficient documentation

## 2023-01-03 MED ORDER — DIPHENHYDRAMINE HCL 25 MG PO CAPS
25.0000 mg | ORAL_CAPSULE | Freq: Once | ORAL | Status: AC
  Administered 2023-01-03: 25 mg via ORAL
  Filled 2023-01-03: qty 1

## 2023-01-03 MED ORDER — TETANUS-DIPHTH-ACELL PERTUSSIS 5-2.5-18.5 LF-MCG/0.5 IM SUSY
0.5000 mL | PREFILLED_SYRINGE | Freq: Once | INTRAMUSCULAR | Status: AC
  Administered 2023-01-03: 0.5 mL via INTRAMUSCULAR
  Filled 2023-01-03: qty 0.5

## 2023-01-03 NOTE — Discharge Instructions (Signed)
You were seen today for itching.  You can try Benadryl to help with the symptoms.  If you develop fever, severe redness or swelling or pain you should return to the ED.  You should follow-up with her primary care doctor.

## 2023-01-03 NOTE — ED Notes (Addendum)
Pt alert and oriented X 4 at the time of discharge. RR even and unlabored. No acute distress noted. Pt verbalized understanding of discharge instructions as discussed. Pt ambulatory to lobby at time of discharge.

## 2023-01-03 NOTE — ED Triage Notes (Signed)
Generalized irritation/ redness itchiness. Mostly to arms hand and face. Reports coming in contact with fine shards of glass.  Sensation of bugs crawling  Started a few days ago.
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
## 2023-01-05 ENCOUNTER — Encounter (HOSPITAL_COMMUNITY): Payer: Self-pay | Admitting: Emergency Medicine

## 2023-01-05 ENCOUNTER — Other Ambulatory Visit: Payer: Self-pay

## 2023-01-05 ENCOUNTER — Emergency Department (HOSPITAL_COMMUNITY)
Admission: EM | Admit: 2023-01-05 | Discharge: 2023-01-06 | Disposition: A | Discharge status: Home / Self Care | Payer: Self-pay | Attending: Emergency Medicine | Admitting: Emergency Medicine

## 2023-01-05 DIAGNOSIS — H1032 Unspecified acute conjunctivitis, left eye: Secondary | ICD-10-CM | POA: Insufficient documentation

## 2023-01-05 NOTE — ED Triage Notes (Signed)
Pt in with L eye drainage and swelling x few days. Pt states he thinks he has had exposure to parasites, itchy skin to lower extremities and groin as well. Has not taken any benadryl today

## 2023-01-06 MED ORDER — ERYTHROMYCIN 5 MG/GM OP OINT: TOPICAL_OINTMENT | OPHTHALMIC | 0 refills | Status: DC

## 2023-01-06 MED ORDER — FLUORESCEIN SODIUM 1 MG OP STRP
1.0000 | ORAL_STRIP | Freq: Once | OPHTHALMIC | Status: AC
  Administered 2023-01-06: 1 via OPHTHALMIC
  Filled 2023-01-06: qty 1

## 2023-01-06 MED ORDER — ERYTHROMYCIN 5 MG/GM OP OINT
1.0000 | TOPICAL_OINTMENT | Freq: Once | OPHTHALMIC | Status: AC
  Administered 2023-01-06: 1 via OPHTHALMIC
  Filled 2023-01-06: qty 3.5

## 2023-01-06 MED ORDER — TETRACAINE HCL 0.5 % OP SOLN
1.0000 [drp] | Freq: Once | OPHTHALMIC | Status: AC
  Administered 2023-01-06: 1 [drp] via OPHTHALMIC
  Filled 2023-01-06: qty 4

## 2023-01-06 NOTE — ED Notes (Signed)
Pt educated to use antibiotic for entire tx regimen. Pt voiced understanding.

## 2023-01-06 NOTE — ED Provider Notes (Signed)
[place] EMERGENCY DEPARTMENT AT [facility] Provider Note   CSN: [id number] Arrival date & time: 01/05/23  2005     History  Chief Complaint  Patient presents with   Eye Problem    Randy Barnes is a 62 y.o. male.  The history is provided by the patient and medical records.  Eye Problem Associated symptoms: discharge    62 year old male with history of alcohol abuse, anxiety, hep C, cataracts, presenting to the ED with left eye drainage.  Patient reports eye has been itching for a few days but over the past 48 hours he has a significant amount of purulent drainage in the left eye.  He has been cleaning it but stopped today as it was becoming more irritated.  He is concerned he has a parasite in his eye.  He does have well water. Also reports a few days ago he fell in some shattered glass so thought maybe glass was in his eye. Prior history of cataracts and multiple surgeries over the years with Dr. Dione Booze.  He wears reading glasses as needed, no corrective lenses.  Home Medications Prior to Admission medications   Medication Sig Start Date End Date Taking? Authorizing Provider  erythromycin ophthalmic ointment Place a 1/2 inch ribbon of ointment into the lower eyelid. 01/06/23  Yes Garlon Hatchet, PA-C  albuterol (VENTOLIN HFA) 108 (90 Base) MCG/ACT inhaler Inhale 2 puffs into the lungs every 6 (six) hours as needed for wheezing or shortness of breath. 08/01/21   Grayce Sessions, NP  atenolol (TENORMIN) 25 MG tablet Take 1 tablet (25 mg total) by mouth daily. 05/29/21   Claiborne Rigg, NP  budesonide-formoterol [facility]) 160-4.5 MCG/ACT inhaler Inhale 2 puffs into the lungs 2 (two) times daily. 01/11/21 01/11/22  Grayce Sessions, NP  cariprazine (VRAYLAR) 1.5 MG capsule Take 1 capsule (1.5 mg total) by mouth daily. 04/20/21   Arfeen, Phillips Grout, MD  gabapentin (NEURONTIN) 800 MG tablet Take 1 tablet (800 mg total) by mouth 4 (four) times daily. 01/11/21   Grayce Sessions, NP  Glucose Blood (BLOOD GLUCOSE TEST STRIPS) STRP Use as directed 03/24/19   Fulp, Cammie, MD  ibuprofen (ADVIL) 200 MG tablet Take 600 mg by mouth every 6 (six) hours as needed for moderate pain.    [provider]  lisinopril (ZESTRIL) 5 MG tablet Take 1 tablet (5 mg total) by mouth daily. 03/16/21   Hoy Register, MD  omeprazole (PRILOSEC) 20 MG capsule Take 1 capsule (20 mg total) by mouth 2 (two) times daily before a meal. DX:  K21.9 GERD 08/17/21   Grayce Sessions, NP  permethrin (ELIMITE) 5 % cream Apply to affected area once 11/14/20   Evern Core, PA-C  sildenafil (REVATIO) 20 MG tablet TAKE 3 TABLETS BY MOUTH 1/2 HOUR PRIOR TO INTERCOURSE. LIMIT USE TO 3 TABS/ 24 HOURS 10/19/21   Claiborne Rigg, NP  tamsulosin [facility]) 0.4 MG CAPS capsule Take 1 capsule by mouth twice daily 10/19/21   Claiborne Rigg, NP      Allergies    Penicillins and Guaifenesin & derivatives    Review of Systems   Review of Systems  Eyes:  Positive for discharge.  All other systems reviewed and are negative.   Physical Exam Updated Vital Signs BP 129/89 (BP Location: Right Arm)   Pulse (!) 101   Temp 98.1 F (36.7 C) (Oral)   Resp 16   Wt 106.6 kg   SpO2 97%  BMI 29.37 kg/m   Physical Exam Vitals and nursing note reviewed.  Constitutional:      Appearance: He is well-developed.  HENT:     Head: Normocephalic and atraumatic.  Eyes:     Conjunctiva/sclera: Conjunctivae normal.     Pupils: Pupils are equal, round, and reactive to light.     Comments: No lid edema or erythema, left eye is matted shut with purulent yellow/green drainage, eye is pried open and conjunctive is mildly injected without edema, no appreciable foreign body, no photophobia Fluorescein stain negative, no corneal abrasion, ulcer, or uptake  Cardiovascular:     Rate and Rhythm: Normal rate and regular rhythm.     Heart sounds: Normal heart sounds.  Pulmonary:     Effort: Pulmonary effort is  normal.     Breath sounds: Normal breath sounds.  Abdominal:     General: Bowel sounds are normal.     Palpations: Abdomen is soft.  Musculoskeletal:        General: Normal range of motion.     Cervical back: Normal range of motion.  Skin:    General: Skin is warm and dry.  Neurological:     Mental Status: He is alert and oriented to person, place, and time.     ED Results / Procedures / Treatments   Labs (all labs ordered are listed, but only abnormal results are displayed) Labs Reviewed - No data to display  EKG None  Radiology No results found.  Procedures Procedures    Medications Ordered in ED Medications  erythromycin ophthalmic ointment 1 Application (has no administration in time range)  fluorescein ophthalmic strip 1 strip (1 strip Left Eye Given 01/06/23 0010)  tetracaine (PONTOCAINE) 0.5 % ophthalmic solution 1 drop (1 drop Left Eye Given 01/06/23 0010)    ED Course/ Medical Decision Making/ A&P                                 Medical Decision Making Risk Prescription drug management.   62 year old male presenting to the ED with left eye drainage.  He is concerned there is a parasite in his eye.  Also reports falling and some broken glass a few days ago and may have shard of glass in the eye.  He does not have any significant lid edema or erythema on exam.  His left eye is matted shut with yellow/green drainage.  Conjunctive is injected but no edema.  I do not appreciate any foreign body.  Fluorescein stain performed without corneal abrasion, ulcer, or uptake.  Suspect this is likely conjunctivitis.  I discussed this with patient and he remains concerned that he has a parasite.  He continues picking along his lateral canthus indicating that there are "parasites coming out".  I do not appreciate any of this on exam.  He does not have any discrete rash.  Discussed with him to try to refrain from this is likely contributing to his eye irritation.  Will start on  erythromycin ointment.  As he has seen Dr. Alden Hipp several times in the past, will refer back to him for follow-up.  He can return here for any new or acute changes.  Final Clinical Impression(s) / ED Diagnoses Final diagnoses:  Acute bacterial conjunctivitis of left eye    Rx / DC Orders ED Discharge Orders          Ordered    erythromycin ophthalmic ointment  01/06/23 0017              Garlon Hatchet, PA-C 01/06/23 0022    Sabas Sous, MD 01/06/23 [phone]

## 2023-01-06 NOTE — Discharge Instructions (Signed)
Take the prescribed medication as directed.  Keep eye clean with damn cloth at least twice daily. Follow-up with Dr. Dione Booze-- I would call his office for follow-up appt. Return to the ED for new or worsening symptoms.

## 2023-01-07 ENCOUNTER — Emergency Department (HOSPITAL_COMMUNITY)
Admission: EM | Admit: 2023-01-07 | Discharge: 2023-01-07 | Disposition: A | Discharge status: Home / Self Care | Payer: Self-pay | Attending: Emergency Medicine | Admitting: Emergency Medicine

## 2023-01-07 ENCOUNTER — Encounter (HOSPITAL_COMMUNITY): Payer: Self-pay

## 2023-01-07 ENCOUNTER — Other Ambulatory Visit: Payer: Self-pay

## 2023-01-07 DIAGNOSIS — R21 Rash and other nonspecific skin eruption: Secondary | ICD-10-CM | POA: Insufficient documentation

## 2023-01-07 DIAGNOSIS — H0100B Unspecified blepharitis left eye, upper and lower eyelids: Secondary | ICD-10-CM | POA: Insufficient documentation

## 2023-01-07 DIAGNOSIS — J449 Chronic obstructive pulmonary disease, unspecified: Secondary | ICD-10-CM | POA: Insufficient documentation

## 2023-01-07 DIAGNOSIS — I10 Essential (primary) hypertension: Secondary | ICD-10-CM | POA: Insufficient documentation

## 2023-01-07 DIAGNOSIS — F17210 Nicotine dependence, cigarettes, uncomplicated: Secondary | ICD-10-CM | POA: Insufficient documentation

## 2023-01-07 DIAGNOSIS — E1140 Type 2 diabetes mellitus with diabetic neuropathy, unspecified: Secondary | ICD-10-CM | POA: Insufficient documentation

## 2023-01-07 DIAGNOSIS — Z7951 Long term (current) use of inhaled steroids: Secondary | ICD-10-CM | POA: Insufficient documentation

## 2023-01-07 DIAGNOSIS — H1032 Unspecified acute conjunctivitis, left eye: Secondary | ICD-10-CM | POA: Insufficient documentation

## 2023-01-07 MED ORDER — PERMETHRIN 5 % EX CREA: TOPICAL_CREAM | CUTANEOUS | 0 refills | Status: DC

## 2023-01-07 MED ORDER — ERYTHROMYCIN 5 MG/GM OP OINT: TOPICAL_OINTMENT | OPHTHALMIC | 0 refills | Status: DC

## 2023-01-07 NOTE — Discharge Instructions (Addendum)
We evaluated you for your itching and your eye drainage.  You have a eye infection.  You are prescribed antibiotics.  I have represcribed these antibiotics.  It is extremely important to pick these up or you will not have an improvement in your symptoms.  Please also try warm compresses, you can take a warm washcloth and place it over your eyelid for 15 minutes 4 times daily.  Please follow-up with your ophthalmologist.  You have seen Dr. Dione Booze in the past.  Please call for follow-up.  Please return if you have any new or worsening symptoms such as eye pain, visual loss, fevers, or any other concerning symptoms.  We also evaluated you for your itching.  We did not see any parasites or mites on our examination.  It is possible you may have a type of parasite infection although we cannot fully evaluate this in the emergency department.  We have prescribed you an antiparasite ointment which you can try.  Please return if you have any new or worsening symptoms.

## 2023-01-07 NOTE — ED Provider Notes (Signed)
[place] EMERGENCY DEPARTMENT AT [facility] Provider Note  CSN: [id number] Arrival date & time: 01/07/23 1058  Chief Complaint(s) Rash and Eye Drainage  HPI Randy Barnes is a 62 y.o. male presenting to the emergency department for "parasite infection".  He reports that he feels itchy all over including his groin, arms, legs.  He is concerned that he has a parasite infection.  Also reports left eye itching with some drainage.  Was seen in the emergency department twice recently for itching, last also for eye infection and had eye exam which was reassuring, referred to ophthalmology.  He was prescribed erythromycin ointment but reports he has not picked it up at all.  No fevers or chills.  No nausea or vomiting.  Reports normal vision.  No eye pain.   Past Medical History Past Medical History:  Diagnosis Date   Anxiety disorder due to general medical condition with panic attack    h/o hospitalizations for behavioral issues (1980s, 2000s)   Aorto-iliac atherosclerosis (HCC)    Arthritis    Cataract    COPD (chronic obstructive pulmonary disease) (HCC)    DDD (degenerative disc disease), lumbar    mild with dextroscoliosis   Depression    Emphysema of lung (HCC)    ?asthmatic bronchitis per prior PCP   GERD (gastroesophageal reflux disease)    Hepatitis C    History of stomach ulcers    Hyperlipidemia    Hypertension    Macular degeneration    MDD (major depressive disorder), recurrent episode, moderate (HCC)    Pneumonia    Polysubstance (including opioids) dependence, daily use (HCC) 10/2015   narcotics, heroin   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   CXR negative, pt states he was on antibiotic for 6 months   Problems related to release from prison 07/2014   Psoriasis 01/08/2015   Type 2 diabetes, uncontrolled, with neuropathy    Previous diabetic, no longer taking medications   Urine incontinence    Patient Active Problem List   Diagnosis Date Noted   Polysubstance abuse  (HCC) 10/07/2019   Lumbar radiculopathy 01/25/2019   Medication refill 08/30/2018   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   Homelessness 07/28/2018   Age-related nuclear cataract of right eye 03/12/2016   Multiple trauma to chest 08/23/2015   Erectile dysfunction 06/13/2015   Cataract 06/13/2015   Aorto-iliac atherosclerosis (HCC)    Smoker 01/26/2015   Alcohol use 01/26/2015   MDD (major depressive disorder), recurrent episode, moderate (HCC)    Psoriasis 01/08/2015   Macular degeneration    Chronic low back pain 12/26/2014   Emphysema of lung (HCC)    Chronic hepatitis C without hepatic coma (HCC)    Anxiety disorder due to general medical condition with panic attack    GERD (gastroesophageal reflux disease)    Hyperlipidemia    Controlled diabetes mellitus with diabetic neuropathy (HCC) 09/16/2014   Essential hypertension 09/16/2014   BPH (benign prostatic hyperplasia) 09/16/2014   Home Medication(s) Prior to Admission medications   Medication Sig Start Date End Date Taking? Authorizing Provider  permethrin (ELIMITE) 5 % cream Apply to affected area once 01/07/23  Yes Sherel Fennell, Jerilee Field, MD  albuterol (VENTOLIN HFA) 108 (90 Base) MCG/ACT inhaler Inhale 2 puffs into the lungs every 6 (six) hours as needed for wheezing or shortness of breath. 08/01/21   Grayce Sessions, NP  atenolol (TENORMIN) 25 MG tablet Take 1 tablet (25 mg total) by mouth daily. 05/29/21  Claiborne Rigg, NP  budesonide-formoterol (SYMBICORT) 160-4.5 MCG/ACT inhaler Inhale 2 puffs into the lungs 2 (two) times daily. 01/11/21 01/11/22  Grayce Sessions, NP  cariprazine (VRAYLAR) 1.5 MG capsule Take 1 capsule (1.5 mg total) by mouth daily. 04/20/21   Arfeen, Phillips Grout, MD  erythromycin ophthalmic ointment Place a 1/2 inch ribbon of ointment into the lower eyelid. Place 1/2 inch ribbon to upper and lower eyelid border. 01/07/23   Lonell Grandchild, MD  gabapentin (NEURONTIN)  800 MG tablet Take 1 tablet (800 mg total) by mouth 4 (four) times daily. 01/11/21   Grayce Sessions, NP  Glucose Blood (BLOOD GLUCOSE TEST STRIPS) STRP Use as directed 03/24/19   Fulp, Cammie, MD  ibuprofen (ADVIL) 200 MG tablet Take 600 mg by mouth every 6 (six) hours as needed for moderate pain.    [provider]  lisinopril (ZESTRIL) 5 MG tablet Take 1 tablet (5 mg total) by mouth daily. 03/16/21   Hoy Register, MD  omeprazole (PRILOSEC) 20 MG capsule Take 1 capsule (20 mg total) by mouth 2 (two) times daily before a meal. DX:  K21.9 GERD 08/17/21   Grayce Sessions, NP  sildenafil (REVATIO) 20 MG tablet TAKE 3 TABLETS BY MOUTH 1/2 HOUR PRIOR TO INTERCOURSE. LIMIT USE TO 3 TABS/ 24 HOURS 10/19/21   Claiborne Rigg, NP  tamsulosin [facility]) 0.4 MG CAPS capsule Take 1 capsule by mouth twice daily 10/19/21   Claiborne Rigg, NP                                                                                                                                    Past Surgical History Past Surgical History:  Procedure Laterality Date   STRABISMUS SURGERY     left eye vision loss, multiple surgeries   TONSILLECTOMY     Family History Family History  Problem Relation Age of Onset   Diabetes Father    Heart failure Father    CAD Father    Stroke Mother    Hypertension Mother    Mental illness Mother    Depression Mother    Irritable bowel syndrome Mother    Bipolar disorder Brother        ?   CAD Paternal Grandfather    Stroke Maternal Grandfather     Social History Social History   Tobacco Use   Smoking status: Every Day    Current packs/day: 0.50    Average packs/day: 0.5 packs/day for 49.8 years (24.9 ttl pk-yrs)    Types: Cigarettes    Start date: 03/18/1973   Smokeless tobacco: Former    Types: Snuff   Tobacco comments:    Has tried dip in the past some, quit for 7 year   Vaping Use   Vaping status: Never Used  Substance Use Topics   Alcohol use: Yes     Alcohol/week: 6.0 standard drinks of alcohol  Types: 6 Cans of beer per week    Comment: occasional, not regular   Drug use: No    Types: Marijuana, IV, Heroin, Oxycodone, Hydrocodone    Comment: 09/25/15 last use   Allergies Penicillins and Guaifenesin & derivatives  Review of Systems Review of Systems  All other systems reviewed and are negative.   Physical Exam Vital Signs  I have reviewed the triage vital signs BP (!) 147/93 (BP Location: Right Arm)   Pulse 78   Temp 98.5 F (36.9 C) (Oral)   Resp 18   SpO2 99%  Physical Exam Vitals and nursing note reviewed.  Constitutional:      General: He is not in acute distress.    Appearance: Normal appearance.  HENT:     Mouth/Throat:     Mouth: Mucous membranes are moist.  Eyes:     Comments: Right eye conjunctiva normal.  Left eye with mild left conjunctival injection.  Pupils equal and reactive bilaterally.  Visual acuity grossly normal with no visual field deficit.  Left eyelid blepharitis to both the superior and inferior eyelid  Cardiovascular:     Rate and Rhythm: Normal rate and regular rhythm.  Pulmonary:     Effort: Pulmonary effort is normal. No respiratory distress.     Breath sounds: Normal breath sounds.  Abdominal:     General: Abdomen is flat.     Palpations: Abdomen is soft.     Tenderness: There is no abdominal tenderness.  Musculoskeletal:     Right lower leg: No edema.     Left lower leg: No edema.  Skin:    General: Skin is warm and dry.     Capillary Refill: Capillary refill takes less than 2 seconds.     Comments: Excoriations to both arms, legs.  Questionable small macules under areas of excoriation.  No mucosal lesions.  Neurological:     Mental Status: He is alert and oriented to person, place, and time. Mental status is at baseline.  Psychiatric:        Mood and Affect: Mood normal.        Behavior: Behavior normal.     Comments: Linear train of thought     ED Results and  Treatments Labs (all labs ordered are listed, but only abnormal results are displayed) Labs Reviewed - No data to display                                                                                                                        Radiology No results found.  Pertinent labs & imaging results that were available during my care of the patient were reviewed by me and considered in my medical decision making (see MDM for details).  Medications Ordered in ED Medications - No data to display  Procedures Procedures  (including critical care time)  Medical Decision Making / ED Course   MDM:  62 year old presenting with itching and eye drainage.  He was evaluated in the emergency department 2 days ago for his eye drainage.  He was diagnosed as conjunctivitis.  Does appear that he has conjunctivitis also with blepharitis.  He was prescribed erythromycin ointment but did not pick this up.  Discussed that he needs to picked up this medication.  Also recommended warm compresses given blepharitis aspect.  Recommended follow-up with ophthalmology.  Discussed return precautions for any eye pain or loss of vision.  He also reports itching.  He has excoriations.  Unclear if he has any actual rash but does maybe have some scattered macules to the hands and legs.  Differential includes delusional parasitosis, scabies, bedbugs.  Not consistent with cellulitis, SJS/TN, abscess, or other acute dangerous rash.  Will prescribe permethrin to see if this helps.  Advise follow-up with a dermatologist.      Additional history obtained: -Additional history obtained from ems -External records from outside source obtained and reviewed including: Chart review including previous notes, labs, imaging, consultation notes including prior er visits   Medicines ordered and  prescription drug management: Meds ordered this encounter  Medications   permethrin (ELIMITE) 5 % cream    Sig: Apply to affected area once    Dispense:  60 g    Refill:  0   erythromycin ophthalmic ointment    Sig: Place a 1/2 inch ribbon of ointment into the lower eyelid. Place 1/2 inch ribbon to upper and lower eyelid border.    Dispense:  3.5 g    Refill:  0    -I have reviewed the patients home medicines and have made adjustments as needed  Social Determinants of Health:  Diagnosis or treatment significantly limited by social determinants of health: polysubstance abuse   Reevaluation: After the interventions noted above, I reevaluated the patient and found that their symptoms have stayed the same  Co morbidities that complicate the patient evaluation  Past Medical History:  Diagnosis Date   Anxiety disorder due to general medical condition with panic attack    h/o hospitalizations for behavioral issues (1980s, 2000s)   Aorto-iliac atherosclerosis (HCC)    Arthritis    Cataract    COPD (chronic obstructive pulmonary disease) (HCC)    DDD (degenerative disc disease), lumbar    mild with dextroscoliosis   Depression    Emphysema of lung (HCC)    ?asthmatic bronchitis per prior PCP   GERD (gastroesophageal reflux disease)    Hepatitis C    History of stomach ulcers    Hyperlipidemia    Hypertension    Macular degeneration    MDD (major depressive disorder), recurrent episode, moderate (HCC)    Pneumonia    Polysubstance (including opioids) dependence, daily use (HCC) 10/2015   narcotics, heroin   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    Final Clinical Impression(s) / ED Diagnoses Final diagnoses:  Blepharitis of both upper and lower eyelid of left eye, unspecified type  Acute conjunctivitis of left eye,  unspecified acute conjunctivitis type  Rash     This chart was dictated using voice recognition software.  Despite best efforts to proofread,  errors can occur which can change the documentation meaning.    Lonell Grandchild, MD 01/07/23 1154

## 2023-01-07 NOTE — ED Triage Notes (Signed)
Patient BIB [facility] from home for itching and suspected parasite infection on skin and in rectum. Patient also c/o itching, swelling, and drainage to his eye. VSS. A&Ox4 and ambulatory.

## 2023-01-10 ENCOUNTER — Other Ambulatory Visit (HOSPITAL_COMMUNITY): Payer: Self-pay

## 2023-01-10 ENCOUNTER — Ambulatory Visit (HOSPITAL_COMMUNITY)
Admission: EM | Admit: 2023-01-10 | Discharge: 2023-01-10 | Disposition: A | Discharge status: Home / Self Care | Payer: Self-pay | Attending: Internal Medicine | Admitting: Internal Medicine

## 2023-01-10 ENCOUNTER — Encounter (HOSPITAL_COMMUNITY): Payer: Self-pay | Admitting: *Deleted

## 2023-01-10 DIAGNOSIS — B308 Other viral conjunctivitis: Secondary | ICD-10-CM

## 2023-01-10 DIAGNOSIS — B829 Intestinal parasitism, unspecified: Secondary | ICD-10-CM

## 2023-01-10 MED ORDER — MEBENDAZOLE 100 MG PO CHEW: 100.0000 mg | CHEWABLE_TABLET | Freq: Two times a day (BID) | ORAL | 0 refills | Status: DC

## 2023-01-10 MED ORDER — ERYTHROMYCIN 5 MG/GM OP OINT
TOPICAL_OINTMENT | OPHTHALMIC | Status: AC
  Filled 2023-01-10: qty 3.5

## 2023-01-10 MED ORDER — FLUORESCEIN SODIUM 1 MG OP STRP
ORAL_STRIP | OPHTHALMIC | Status: AC
  Filled 2023-01-10: qty 2

## 2023-01-10 MED ORDER — ERYTHROMYCIN 5 MG/GM OP OINT: TOPICAL_OINTMENT | Freq: Once | OPHTHALMIC | Status: DC

## 2023-01-10 MED ORDER — TETRACAINE HCL 0.5 % OP SOLN
OPHTHALMIC | Status: AC
  Filled 2023-01-10: qty 4

## 2023-01-10 NOTE — ED Triage Notes (Signed)
Patient frantic and saying that he has parasites in his eyes.Patient was in [facility] ED on 01/07/23 for the same. Patient states, "I have been trying to pee them out and poop them out, but they are eating my brain.  Patient has swelling and redness of the eyes.

## 2023-01-10 NOTE — ED Provider Notes (Signed)
[facility]    CSN: [id number] Arrival date & time: 01/10/23  5284      History   Chief Complaint Chief Complaint  Patient presents with   Eye Pain    HPI Randy Barnes is a 62 y.o. male who presents stating he has parasites coming from his eyes, skin and stool. Has not gone to the eye Dr of filled his rx of the erythromycin ointment due to his medicaid not being active. He is here today with severe pain in his eyes. Denies vision changes. Was seen in ER twice for this.     Past Medical History:  Diagnosis Date   Anxiety disorder due to general medical condition with panic attack    h/o hospitalizations for behavioral issues (1980s, 2000s)   Aorto-iliac atherosclerosis (HCC)    Arthritis    Cataract    COPD (chronic obstructive pulmonary disease) (HCC)    DDD (degenerative disc disease), lumbar    mild with dextroscoliosis   Depression    Emphysema of lung (HCC)    ?asthmatic bronchitis per prior PCP   GERD (gastroesophageal reflux disease)    Hepatitis C    History of stomach ulcers    Hyperlipidemia    Hypertension    Macular degeneration    MDD (major depressive disorder), recurrent episode, moderate (HCC)    Pneumonia    Polysubstance (including opioids) dependence, daily use (HCC) 10/2015   narcotics, heroin   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   CXR negative, pt states he was on antibiotic for 6 months   Problems related to release from prison 07/2014   Psoriasis 01/08/2015   Type 2 diabetes, uncontrolled, with neuropathy    Previous diabetic, no longer taking medications   Urine incontinence     Patient Active Problem List   Diagnosis Date Noted   Polysubstance abuse (HCC) 10/07/2019   Lumbar radiculopathy 01/25/2019   Medication refill 08/30/2018   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   Homelessness 07/28/2018   Age-related nuclear cataract of right eye 03/12/2016   Multiple trauma to chest 08/23/2015   Erectile  dysfunction 06/13/2015   Cataract 06/13/2015   Aorto-iliac atherosclerosis (HCC)    Smoker 01/26/2015   Alcohol use 01/26/2015   MDD (major depressive disorder), recurrent episode, moderate (HCC)    Psoriasis 01/08/2015   Macular degeneration    Chronic low back pain 12/26/2014   Emphysema of lung (HCC)    Chronic hepatitis C without hepatic coma (HCC)    Anxiety disorder due to general medical condition with panic attack    GERD (gastroesophageal reflux disease)    Hyperlipidemia    Controlled diabetes mellitus with diabetic neuropathy (HCC) 09/16/2014   Essential hypertension 09/16/2014   BPH (benign prostatic hyperplasia) 09/16/2014    Past Surgical History:  Procedure Laterality Date   STRABISMUS SURGERY     left eye vision loss, multiple surgeries   TONSILLECTOMY         Home Medications    Prior to Admission medications   Medication Sig Start Date End Date Taking? Authorizing Provider  mebendazole (VERMOX) 100 MG chewable tablet Chew 1 tablet (100 mg total) by mouth 2 (two) times daily. 01/10/23  Yes Rodriguez-Southworth, Nettie Elm, PA-C  albuterol (VENTOLIN HFA) 108 (90 Base) MCG/ACT inhaler Inhale 2 puffs into the lungs every 6 (six) hours as needed for wheezing or shortness of breath. 08/01/21   Grayce Sessions, NP  atenolol (TENORMIN) 25 MG tablet Take 1 tablet (  25 mg total) by mouth daily. 05/29/21   Claiborne Rigg, NP  budesonide-formoterol [facility]) 160-4.5 MCG/ACT inhaler Inhale 2 puffs into the lungs 2 (two) times daily. 01/11/21 01/11/22  Grayce Sessions, NP  cariprazine (VRAYLAR) 1.5 MG capsule Take 1 capsule (1.5 mg total) by mouth daily. 04/20/21   Arfeen, Phillips Grout, MD  erythromycin ophthalmic ointment Place a 1/2 inch ribbon of ointment into the lower eyelid. Place 1/2 inch ribbon to upper and lower eyelid border. 01/07/23   Lonell Grandchild, MD  gabapentin (NEURONTIN) 800 MG tablet Take 1 tablet (800 mg total) by mouth 4 (four) times daily. 01/11/21    Grayce Sessions, NP  Glucose Blood (BLOOD GLUCOSE TEST STRIPS) STRP Use as directed 03/24/19   Fulp, Cammie, MD  ibuprofen (ADVIL) 200 MG tablet Take 600 mg by mouth every 6 (six) hours as needed for moderate pain.    [provider]  lisinopril (ZESTRIL) 5 MG tablet Take 1 tablet (5 mg total) by mouth daily. 03/16/21   Hoy Register, MD  omeprazole (PRILOSEC) 20 MG capsule Take 1 capsule (20 mg total) by mouth 2 (two) times daily before a meal. DX:  K21.9 GERD 08/17/21   Grayce Sessions, NP  permethrin (ELIMITE) 5 % cream Apply to affected area once 01/07/23   Lonell Grandchild, MD  sildenafil (REVATIO) 20 MG tablet TAKE 3 TABLETS BY MOUTH 1/2 HOUR PRIOR TO INTERCOURSE. LIMIT USE TO 3 TABS/ 24 HOURS 10/19/21   Claiborne Rigg, NP  tamsulosin [facility]) 0.4 MG CAPS capsule Take 1 capsule by mouth twice daily 10/19/21   Claiborne Rigg, NP    Family History Family History  Problem Relation Age of Onset   Diabetes Father    Heart failure Father    CAD Father    Stroke Mother    Hypertension Mother    Mental illness Mother    Depression Mother    Irritable bowel syndrome Mother    Bipolar disorder Brother        ?   CAD Paternal Grandfather    Stroke Maternal Grandfather     Social History Social History   Tobacco Use   Smoking status: Every Day    Current packs/day: 0.50    Average packs/day: 0.5 packs/day for 49.8 years (24.9 ttl pk-yrs)    Types: Cigarettes    Start date: 03/18/1973   Smokeless tobacco: Former    Types: Snuff   Tobacco comments:    Has tried dip in the past some, quit for 7 year   Vaping Use   Vaping status: Never Used  Substance Use Topics   Alcohol use: Yes    Alcohol/week: 6.0 standard drinks of alcohol    Types: 6 Cans of beer per week    Comment: occasional, not regular   Drug use: Not Currently    Types: Marijuana, IV, Heroin, Oxycodone, Hydrocodone    Comment: last positive drus screen 2020     Allergies   Penicillins and  Guaifenesin & derivatives   Review of Systems Review of Systems  As noted in HPI Physical Exam Triage Vital Signs ED Triage Vitals  Encounter Vitals Group     BP 01/10/23 1951 (!) 147/90     Systolic BP Percentile --      Diastolic BP Percentile --      Pulse Rate 01/10/23 1951 (!) 110     Resp 01/10/23 1951 18     Temp 01/10/23 1951 99.5 F (37.5  C)     Temp Source 01/10/23 1951 Oral     SpO2 01/10/23 1951 95 %     Weight --      Height --      Head Circumference --      Peak Flow --      Pain Score 01/10/23 1953 10     Pain Loc --      Pain Education --      Exclude from Growth Chart --    No data found.  Updated Vital Signs BP (!) 147/90 (BP Location: Right Arm)   Pulse (!) 110   Temp 99.5 F (37.5 C) (Oral)   Resp 18   SpO2 95%   Visual Acuity Right Eye Distance:   Left Eye Distance:   Bilateral Distance:    Right Eye Near:   Left Eye Near:    Bilateral Near:     Physical Exam Vitals reviewed.  Constitutional:      Comments: He was screaming in pain sticking his finger in his R eye. His scleras are injected. Has dry skin and redness on the skin around the lids and orbit area. But is not hot. There is no active drainage. All the " white parasites" he is showing me are just dry skin.   HENT:     Head: Normocephalic.     Right Ear: External ear normal.     Left Ear: External ear normal.  Eyes:     Pupils: Pupils are equal, round, and reactive to light.     Slit lamp exam:    Right eye: No corneal ulcer, foreign body or photophobia.     Left eye: No corneal ulcer, foreign body or photophobia.  Pulmonary:     Effort: Pulmonary effort is normal.  Musculoskeletal:        General: Normal range of motion.     Cervical back: Neck supple.  Skin:    General: Skin is warm and dry.  Neurological:     Mental Status: He is alert and oriented to person, place, and time.     Gait: Gait normal.  Psychiatric:     Comments: He calmed down after I applied the  tetracaine in his R eye and let me do a good eye exam and staining.  He was anxious on arrival, linear thinking      UC Treatments / Results  Labs (all labs ordered are listed, but only abnormal results are displayed) Labs Reviewed - No data to display  EKG   Radiology No results found.  Procedures Procedures (including critical care time)  Medications Ordered in UC Medications  erythromycin ophthalmic ointment (has no administration in time range)    Initial Impression / Assessment and Plan / UC Course  I have reviewed the triage vital signs and the nursing notes.  Conjunctivitis Delusions of parasites in his eyes and body Parasites seen in stool   I gave him the erythromycin ointment to take home and use it tid x 7 days Needs to FU with eye MD I also sent rx of Vermox one time dose as noted.  Pt left calm, was pleasant and grateful for our care.     Final Clinical Impressions(s) / UC Diagnoses   Final diagnoses:  Chronic viral conjunctivitis of both eyes  Parasites in stool     Discharge Instructions      Use the eye ointment three times a day for 5-7 days I sent the parasite medication which is  one dose only     ED Prescriptions     Medication Sig Dispense Auth. Provider   mebendazole (VERMOX) 100 MG chewable tablet Chew 1 tablet (100 mg total) by mouth 2 (two) times daily. 1 tablet Rodriguez-Southworth, Nettie Elm, PA-C      PDMP not reviewed this encounter.   Garey Ham, Cordelia Poche 01/10/23 2027

## 2023-01-10 NOTE — Discharge Instructions (Signed)
Use the eye ointment three times a day for 5-7 days I sent the parasite medication which is one dose only

## 2023-01-12 ENCOUNTER — Encounter (HOSPITAL_COMMUNITY): Payer: Self-pay

## 2023-01-12 ENCOUNTER — Emergency Department (HOSPITAL_COMMUNITY)
Admission: EM | Admit: 2023-01-12 | Discharge: 2023-01-12 | Disposition: A | Discharge status: Home / Self Care | Payer: Self-pay | Attending: Emergency Medicine | Admitting: Emergency Medicine

## 2023-01-12 ENCOUNTER — Other Ambulatory Visit: Payer: Self-pay

## 2023-01-12 DIAGNOSIS — M79672 Pain in left foot: Secondary | ICD-10-CM | POA: Insufficient documentation

## 2023-01-12 DIAGNOSIS — E119 Type 2 diabetes mellitus without complications: Secondary | ICD-10-CM | POA: Insufficient documentation

## 2023-01-12 DIAGNOSIS — F22 Delusional disorders: Secondary | ICD-10-CM | POA: Insufficient documentation

## 2023-01-12 DIAGNOSIS — R21 Rash and other nonspecific skin eruption: Secondary | ICD-10-CM | POA: Insufficient documentation

## 2023-01-12 DIAGNOSIS — Z79899 Other long term (current) drug therapy: Secondary | ICD-10-CM | POA: Insufficient documentation

## 2023-01-12 DIAGNOSIS — I10 Essential (primary) hypertension: Secondary | ICD-10-CM | POA: Insufficient documentation

## 2023-01-12 DIAGNOSIS — J449 Chronic obstructive pulmonary disease, unspecified: Secondary | ICD-10-CM | POA: Insufficient documentation

## 2023-01-12 MED ORDER — TETRACAINE HCL 0.5 % OP SOLN
1.0000 [drp] | Freq: Once | OPHTHALMIC | Status: AC
Start: 2023-01-12 — End: 2023-01-12
  Administered 2023-01-12: 1 [drp] via OPHTHALMIC
  Filled 2023-01-12: qty 4

## 2023-01-12 MED ORDER — ACYCLOVIR 800 MG PO TABS
800.0000 mg | ORAL_TABLET | Freq: Every day | ORAL | 0 refills | Status: AC
  Filled 2023-01-14: qty 35, 7d supply, fill #0

## 2023-01-12 MED ORDER — POLYMYXIN B-TRIMETHOPRIM 10000-0.1 UNIT/ML-% OP SOLN
1.0000 [drp] | OPHTHALMIC | Status: DC
  Administered 2023-01-12: 1 [drp] via OPHTHALMIC
  Filled 2023-01-12: qty 10

## 2023-01-12 MED ORDER — CLINDAMYCIN HCL 150 MG PO CAPS
450.0000 mg | ORAL_CAPSULE | Freq: Three times a day (TID) | ORAL | 0 refills | Status: AC
  Filled 2023-01-20: qty 45, 5d supply, fill #0

## 2023-01-12 MED ORDER — ZIPRASIDONE MESYLATE 20 MG IM SOLR: 20.0000 mg | Freq: Once | INTRAMUSCULAR | Status: DC

## 2023-01-12 MED ORDER — HYDROXYZINE HCL 25 MG PO TABS
25.0000 mg | ORAL_TABLET | Freq: Once | ORAL | Status: AC
  Administered 2023-01-12: 25 mg via ORAL
  Filled 2023-01-12: qty 1

## 2023-01-12 MED ORDER — ERYTHROMYCIN 5 MG/GM OP OINT
TOPICAL_OINTMENT | Freq: Once | OPHTHALMIC | Status: AC
  Filled 2023-01-12: qty 3.5

## 2023-01-12 MED ORDER — HYDROXYZINE HCL 25 MG PO TABS: 25.0000 mg | ORAL_TABLET | Freq: Four times a day (QID) | ORAL | 0 refills | Status: AC

## 2023-01-12 MED ORDER — FLUORESCEIN SODIUM 1 MG OP STRP
1.0000 | ORAL_STRIP | Freq: Once | OPHTHALMIC | Status: AC
  Administered 2023-01-12: 1 via OPHTHALMIC
  Filled 2023-01-12: qty 1

## 2023-01-12 MED ORDER — CLINDAMYCIN HCL 150 MG PO CAPS: 150.0000 mg | ORAL_CAPSULE | Freq: Four times a day (QID) | ORAL | 0 refills | Status: DC

## 2023-01-12 NOTE — ED Triage Notes (Signed)
Pt reports having bugs under his skin, in his eyes, and in his mouth x 1 month

## 2023-01-12 NOTE — ED Provider Notes (Signed)
[facility] Emergency Department Provider Note MRN:  [id number]  Arrival date & time: 01/12/23     Chief Complaint   Rash   History of Present Illness   Randy Barnes is a 62 y.o. year-old male presents to the ED with chief complaint of eye pain.  States that he has parasites in his eyes.  Has been picking at his eyes.  Seen at urgent care 2 days ago for the same and didn't have any corneal abrasions seen.  He was given romycin for conjunctivitis. He states that his symptoms have worsened.  Says that the "delusional parasites are in my eyes."  History provided by patient.   Review of Systems  Pertinent positive and negative review of systems noted in HPI.    Physical Exam   Vitals:   01/12/23 0308  BP: (!) 151/102  Pulse: (!) 102  Resp: (!) 22  Temp: 97.8 F (36.6 C)  SpO2: 100%    CONSTITUTIONAL:  agitated-appearing, anxious NEURO:  Alert and oriented x 3, CN 3-12 grossly intact EYES:  eyes equal and reactive, no fluorescein uptake or sign of corneal abrasion ENT/NECK:  Supple, no stridor  CARDIO:  tachycardic, regular rhythm, appears well-perfused  PULM:  No respiratory distress,  GI/GU:  non-distended,  MSK/SPINE:  No gross deformities, no edema, moves all extremities  SKIN:  excoriations on extremities   *Additional and/or pertinent findings included in MDM below  Diagnostic and Interventional Summary    EKG Interpretation Date/Time:    Ventricular Rate:    PR Interval:    QRS Duration:    QT Interval:    QTC Calculation:   R Axis:      Text Interpretation:         Labs Reviewed - No data to display  No orders to display    Medications  trimethoprim-polymyxin b (POLYTRIM) ophthalmic solution 1 drop (has no administration in time range)  hydrOXYzine (ATARAX) tablet 25 mg (has no administration in time range)  tetracaine (PONTOCAINE) 0.5 % ophthalmic solution 1 drop (1 drop Both Eyes Given by Other 01/12/23 0420)   fluorescein ophthalmic strip 1 strip (1 strip Both Eyes Given by Other 01/12/23 0420)     Procedures  /  Critical Care Procedures  ED Course and Medical Decision Making  I have reviewed the triage vital signs, the nursing notes, and pertinent available records from the EMR.  Social Determinants Affecting Complexity of Care: Patient is homelessness.   ED Course: Clinical Course as of 01/12/23 0430  Wynelle Link Jan 12, 2023  8295 Patient calmed down after being put in a room.  Chemical restraint NOT given. [RB]    Clinical Course User Index [RB] Roxy Horseman, PA-C    Medical Decision Making Patient here complaining of parasites in his eyes.  He says that everyone thinks he is crazy. History is consistent with delusional parasitosis.  Recommended CBT, but patient scoffed at the idea.  Will give polytrim drops.  I don't see a corneal abrasion.  Will trial hydroxyzine to see if it'll help with his symptoms.  Risk Prescription drug management.         Consultants: No consultations were needed in caring for this patient.   Treatment and Plan: Emergency department workup does not suggest an emergent condition requiring admission or immediate intervention beyond  what has been performed at this time. The patient is safe for discharge and has  been instructed to return immediately for worsening symptoms, change in  symptoms or any other concerns  Patient seen by and discussed with attending physician, Dr. Wilkie Aye.  Final Clinical Impressions(s) / ED Diagnoses     ICD-10-CM   1. Ekbom's delusional parasitosis (HCC)  F22       ED Discharge Orders          Ordered    hydrOXYzine (ATARAX) 25 MG tablet  Every 6 hours        01/12/23 0427              Discharge Instructions Discussed with and Provided to Patient:   Discharge Instructions   None      Roxy Horseman, PA-C 01/12/23 0430    Shon Baton, MD 01/12/23 [phone]

## 2023-01-12 NOTE — ED Triage Notes (Signed)
Pt BIBA from home. C/o parasites throughout his body. Pt's skin is red and irriated. Does have 7/10 pain in L foot, appears to have a blistered rash there. Pt has not been using their prescribed eye ointment and says they've been rubbing their eyes with rags soaked in hydrogen peroxide

## 2023-01-12 NOTE — ED Provider Notes (Addendum)
EMERGENCY DEPARTMENT AT [facility] Provider Note   CSN: [id number] Arrival date & time: 01/12/23  1520     History  Chief Complaint  Patient presents with   Paranoid   Rash    Randy Barnes is a 62 y.o. male with PMH as listed below who is BIBA from home, has been staying with his mother. He c/o L foot pain that started a few weeks ago but has recently gotten worse. C/o parasites throughout his body but states that is it better today than usual. Those symptoms have been ongoing for weeks after smoking meth. Was seen this AM c/o parasites and diagnosed w/ delusional parasitosis. Had complained of bugs all over and in his eyes, was prescribed drops for bacterial conjunctivitis. Pt has not been using their prescribed eye ointment and says they've been rubbing their eyes with rags soaked in hydrogen peroxide. He states his eyes feel improved now. He states he is tired because he was up all night "fighting the bugs." He denies EtOH/illicit drug use, denies AH/VH/SI/HI. For rash patient denies pruritus or pain, states he didn't even notice it until this morning. Has no constitutional symptoms. No new rashes/soaps/detergents. States he used the romycin ointment once but his mother lost the drops so he doesn't have them at home anymore.  Past Medical History:  Diagnosis Date   Anxiety disorder due to general medical condition with panic attack    h/o hospitalizations for behavioral issues (1980s, 2000s)   Aorto-iliac atherosclerosis (HCC)    Arthritis    Cataract    COPD (chronic obstructive pulmonary disease) (HCC)    DDD (degenerative disc disease), lumbar    mild with dextroscoliosis   Depression    Emphysema of lung (HCC)    ?asthmatic bronchitis per prior PCP   GERD (gastroesophageal reflux disease)    Hepatitis C    History of stomach ulcers    Hyperlipidemia    Hypertension    Macular degeneration    MDD (major depressive disorder), recurrent episode,  moderate (HCC)    Pneumonia    Polysubstance (including opioids) dependence, daily use (HCC) 10/2015   narcotics, heroin   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 Authorizing Provider  acyclovir (ZOVIRAX) 800 MG tablet Take 1 tablet (800 mg total) by mouth 5 (five) times daily for 7 days. 01/12/23 01/19/23 Yes Loetta Rough, MD  clindamycin (CLEOCIN) 150 MG capsule Take 1 capsule (150 mg total) by mouth every 6 (six) hours. 01/12/23  Yes Loetta Rough, MD  albuterol (VENTOLIN HFA) 108 (90 Base) MCG/ACT inhaler Inhale 2 puffs into the lungs every 6 (six) hours as needed for wheezing or shortness of breath. 08/01/21   Grayce Sessions, NP  atenolol (TENORMIN) 25 MG tablet Take 1 tablet (25 mg total) by mouth daily. 05/29/21   Claiborne Rigg, NP  budesonide-formoterol [facility]) 160-4.5 MCG/ACT inhaler Inhale 2 puffs into the lungs 2 (two) times daily. 01/11/21 01/11/22  Grayce Sessions, NP  cariprazine (VRAYLAR) 1.5 MG capsule Take 1 capsule (1.5 mg total) by mouth daily. 04/20/21   Arfeen,  Phillips Grout, MD  erythromycin ophthalmic ointment Place a 1/2 inch ribbon of ointment into the lower eyelid. Place 1/2 inch ribbon to upper and lower eyelid border. 01/07/23   Lonell Grandchild, MD  gabapentin (NEURONTIN) 800 MG tablet Take 1 tablet (800 mg total) by mouth 4 (four) times daily. 01/11/21   Grayce Sessions, NP  Glucose Blood (BLOOD GLUCOSE TEST STRIPS) STRP Use as directed 03/24/19   Fulp, Cammie, MD  hydrOXYzine (ATARAX) 25 MG tablet Take 1 tablet (25 mg total) by mouth every 6 (six) hours. 01/12/23   Roxy Horseman, PA-C  ibuprofen (ADVIL) 200 MG  tablet Take 600 mg by mouth every 6 (six) hours as needed for moderate pain.    [provider]  lisinopril (ZESTRIL) 5 MG tablet Take 1 tablet (5 mg total) by mouth daily. 03/16/21   Hoy Register, MD  mebendazole (VERMOX) 100 MG chewable tablet Chew 1 tablet (100 mg total) by mouth 2 (two) times daily. 01/10/23   Rodriguez-Southworth, Nettie Elm, PA-C  omeprazole (PRILOSEC) 20 MG capsule Take 1 capsule (20 mg total) by mouth 2 (two) times daily before a meal. DX:  K21.9 GERD 08/17/21   Grayce Sessions, NP  permethrin (ELIMITE) 5 % cream Apply to affected area once 01/07/23   Lonell Grandchild, MD  sildenafil (REVATIO) 20 MG tablet TAKE 3 TABLETS BY MOUTH 1/2 HOUR PRIOR TO INTERCOURSE. LIMIT USE TO 3 TABS/ 24 HOURS 10/19/21   Claiborne Rigg, NP  tamsulosin [facility]) 0.4 MG CAPS capsule Take 1 capsule by mouth twice daily 10/19/21   Claiborne Rigg, NP      Allergies    Penicillins and Guaifenesin & derivatives    Review of Systems   Review of Systems A 10 point review of systems was performed and is negative unless otherwise reported in HPI.  Physical Exam Updated Vital Signs BP 135/81 (BP Location: Right Arm)   Pulse 66   Temp 98 F (36.7 C) (Oral)   Resp 19   Ht 6\' 3"  (1.905 m)   Wt 88.5 kg   SpO2 97%   BMI 24.37 kg/m  Physical Exam General: Normal appearing male, lying in bed.  HEENT: PERRLA, EOMI, mild injected conjunctivae bilaterally with no purulent drainage. No periorbital swelling/edema/erythema. Sclera anicteric, MMM, trachea midline.  Cardiology: RRR, no murmurs/rubs/gallops. Resp: Normal respiratory rate and effort. CTAB, no wheezes, rhonchi, crackles.  Abd: Soft, non-tender, non-distended. No rebound tenderness or guarding.  GU: Deferred. MSK: No peripheral edema or signs of trauma. Compartments are soft.  Skin: warm, dry. Diffuse blanching erythematous maculopapular rash on abdomen, lower extremities. Diffuse excoriations of multiple sites. Abrasions on L  forearm. L dorsal foot has erythema and TTP with same erythematous rash but possible induration as well, does have some swelling compared to R foot. In area of TTP there are some small vesicles as well. No fluctuance, purulent drainage.  Neuro: A&Ox4, CNs II-XII grossly intact. MAEs. Sensation grossly intact.  Psych: Normal mood and affect.   ED Results / Procedures / Treatments   Labs (all labs ordered are listed, but only abnormal results are displayed) Labs Reviewed - No data to display  EKG None  Radiology No results found.  Procedures Procedures    Medications Ordered in ED Medications - No data to display  ED Course/ Medical Decision Making/ A&P                          Medical  Decision Making Risk Prescription drug management.    This patient presents to the ED for concern of L foot pain; this involves an extensive number of treatment options, and is a complaint that carries with it a high risk of complications and morbidity.  I considered the following differential and admission for this acute, potentially life threatening condition.   MDM:    For blisters/pain on LE, consider shingles vs bullous impetigo, plus or minus cellulitis. No fluctuance or purulent drainage. Patient does have diffuse erythematous rash on abdomen and legs, concern for excoriations d/t "fighting with bugs." Also considered drug rash but denies any new medications/antibiotics recently, no c/f DRESS, SJS, TEN, serum sickness, or SSSS. No diffuse bullae to indicate bullous pemphigoid or pemphigus vulgaris. He is well appearing and afebrile, HDS. No c/f sepsis or serious bacterial infection. Do not believe he has a life threatening etiology of these symptoms. Considered contact dermatitis as well but no new rashes/soaps/lotions.  Will prescribe clindamycin to cover for cellulitis vs bullous impetigo, given h/o hives/swelling allergy with penicillin use. Will also prescribe acyclovir for possible  shingles, as it is only on the top of left foot and does appear like zoster, described also as extremely painful and sometimes burning. Will also give more erythromycin ocular ointment to continue treatment as originally prescribed.  For his delusional parasitosis I recommended he follow with [facility] to be evaluated. 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 He is not acutely intoxicated with meth, denies use in the last few weeks, and is A&Ox4 currently with goal directed thought processes and content.  I instructed patient to go directly to [facility] today for further evaluation. Patient is also recommended to follow with PCP and/or dermatology for erythematous rash. DC w/ discharge instructions/return precautions. All questions answered to patient's satisfaction.         Additional history obtained from chart review.    Reevaluation: I reevaluated the patient and found that they have :stayed the same  Social Determinants of Health: Homeless and staying with mother  Disposition:  DC w/ discharge instructions/return precautions. All questions answered to patient's satisfaction.    Co morbidities that complicate the patient evaluation  Past Medical History:  Diagnosis Date   Anxiety disorder due to general medical condition with panic attack    h/o hospitalizations for behavioral issues (1980s, 2000s)   Aorto-iliac atherosclerosis (HCC)    Arthritis    Cataract    COPD (chronic obstructive pulmonary disease) (HCC)    DDD (degenerative disc disease), lumbar    mild with dextroscoliosis   Depression    Emphysema of lung (HCC)    ?asthmatic bronchitis per prior PCP   GERD (gastroesophageal reflux disease)    Hepatitis C    History of stomach ulcers    Hyperlipidemia    Hypertension    Macular degeneration    MDD (major depressive disorder), recurrent episode, moderate (HCC)    Pneumonia    Polysubstance (including opioids) dependence, daily use (HCC) 10/2015   narcotics,  heroin   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    Dispense:  35 tablet    Refill:  0   clindamycin (CLEOCIN) 150 MG capsule    Sig: Take 1 capsule (150 mg total) by mouth every 6 (six) hours.    Dispense:  28 capsule    Refill:  0    I have reviewed the patients home medicines and have made adjustments as needed  Problem List / ED Course: Problem List Items Addressed This Visit   None Visit Diagnoses     Rash    -  Primary   Ekbom's delusional parasitosis (HCC)       Left foot pain              Loetta Rough, MD 01/12/23 2320

## 2023-01-12 NOTE — Discharge Instructions (Addendum)
Thank you for coming to [facility] Emergency Department. You were seen for left foot pain. We did an exam, and these showed a red rash as well as blisters that are concerning for either a skin infection or shingles. Please take acyclovir 800 mg five times per day for 7 days. Please also take clindamycin 450 mg (3 tablets) three times per day for 5 days.  Please follow up with your primary care provider within 1 week. For your red rash you can call [phone] to make an appointment with a dermatologist. Please also go directly to [facility] for evaluation for your concerns for parasites. They are located at [address] [place] [address].   Do not hesitate to return to the ED or call 911 if you experience: -Worsening symptoms -Worsening of the rash -Spreading of the blisters -Blisters in your groin or mouth -Lightheadedness, passing out -Fevers/chills -Anything else that concerns you

## 2023-01-13 ENCOUNTER — Ambulatory Visit: Payer: Self-pay

## 2023-01-13 NOTE — Telephone Encounter (Signed)
Chief Complaint: Rash Symptoms: itching, pain, red bumps  Frequency: constant  Pertinent Negatives: Patient denies fever, nausea, vomiting Disposition: [] ED /[] Urgent Care (no appt availability in office) / [] Appointment(In office/virtual)/ []  [facility]/ [] Home Care/ [] Refused Recommended Disposition /[] [place] [facility]/ []  Follow-up with PCP Additional Notes: Patient stated that he was seen in the ED yesterday and was prescribed 2 medications but he can not get them from [facility] because it is too expensive. Patient is requesting that the Rx been to the [facility] pharmacy or  [facility] on [address] Medications are Clindamycin 150 MGand Acyclovir 800 MG Reason for Disposition  Heat rash, questions about  Answer Assessment - Initial Assessment Questions 1. APPEARANCE of RASH: "Describe the rash." (e.g., spots, blisters, raised areas, skin peeling, scaly)     Red and bumpy 2. SIZE: "How big are the spots?" (e.g., tip of pen, eraser, coin; inches, centimeters)     I don't know it's all over  3. LOCATION: "Where is the rash located?"      My buttocks, my legs, my eyes 4. COLOR: "What color is the rash?" (Note: It is difficult to assess rash color in people with darker-colored skin. When this situation occurs, simply ask the caller to describe what they see.)     Red 5. ONSET: "When did the rash begin?"     About 1 month ago  6. FEVER: "Do you have a fever?" If Yes, ask: "What is your temperature, how was it measured, and when did it start?"     no 7. ITCHING: "Does the rash itch?" If Yes, ask: "How bad is the itch?" (Scale 1-10; or mild, moderate, severe)     Moderate  8. CAUSE: "What do you think is causing the rash?"     I think I have a parasite in my body  9. MEDICINE FACTORS: "Have you started any new medicines within the last 2 weeks?" (e.g., antibiotics)      No  10. OTHER SYMPTOMS: "Do you have any other symptoms?" (e.g., dizziness, headache, sore  throat, joint pain)       pain  Protocols used: Rash or Redness - [facility]

## 2023-01-14 ENCOUNTER — Other Ambulatory Visit: Payer: Self-pay

## 2023-01-14 ENCOUNTER — Telehealth: Payer: Self-pay | Admitting: Nurse Practitioner

## 2023-01-14 MED ORDER — CLINDAMYCIN HCL 150 MG PO CAPS
150.0000 mg | ORAL_CAPSULE | Freq: Four times a day (QID) | ORAL | 0 refills | Status: DC
  Filled 2023-01-14: qty 28, 7d supply, fill #0

## 2023-01-14 NOTE — Telephone Encounter (Signed)
Patient requesting medication please reference 01/13/2023 NT note  regarding medication. Patient would like a follow up call when medication is sent.    [facility] [address] [place], [place], [place] [address] Phone: [phone]

## 2023-01-14 NOTE — Telephone Encounter (Signed)
Call placed to patient unable to reach message left on VM.    Call to advise the patient the his medication is now at community pharmacy.Also the advise patient of the medicaid expandation location in  this building

## 2023-01-14 NOTE — Telephone Encounter (Signed)
Spoke with patient . Patient voiced that he was going to pick up medication at community pharmacy today. Advised patient that if he did not have the funds to get his medications today to make the pharmacy aware because they options  to offer him so he will still be able to get his medication filled.

## 2023-01-15 ENCOUNTER — Other Ambulatory Visit: Payer: Self-pay

## 2023-01-18 ENCOUNTER — Emergency Department (HOSPITAL_COMMUNITY)
Admission: EM | Admit: 2023-01-18 | Discharge: 2023-01-18 | Disposition: A | Discharge status: Home / Self Care | Payer: Medicaid Other | Attending: Emergency Medicine | Admitting: Emergency Medicine

## 2023-01-18 ENCOUNTER — Emergency Department (HOSPITAL_COMMUNITY): Payer: Medicaid Other

## 2023-01-18 ENCOUNTER — Ambulatory Visit (HOSPITAL_COMMUNITY)
Admission: EM | Admit: 2023-01-18 | Discharge: 2023-01-18 | Disposition: A | Discharge status: Other Acute Inpatient Hospital | Payer: Medicaid Other | Attending: Psychiatry | Admitting: Psychiatry

## 2023-01-18 ENCOUNTER — Encounter (HOSPITAL_COMMUNITY): Payer: Self-pay | Admitting: Emergency Medicine

## 2023-01-18 DIAGNOSIS — F1510 Other stimulant abuse, uncomplicated: Secondary | ICD-10-CM

## 2023-01-18 DIAGNOSIS — I10 Essential (primary) hypertension: Secondary | ICD-10-CM | POA: Insufficient documentation

## 2023-01-18 DIAGNOSIS — Z4800 Encounter for change or removal of nonsurgical wound dressing: Secondary | ICD-10-CM | POA: Insufficient documentation

## 2023-01-18 DIAGNOSIS — R45851 Suicidal ideations: Secondary | ICD-10-CM | POA: Insufficient documentation

## 2023-01-18 DIAGNOSIS — E119 Type 2 diabetes mellitus without complications: Secondary | ICD-10-CM | POA: Insufficient documentation

## 2023-01-18 DIAGNOSIS — R600 Localized edema: Secondary | ICD-10-CM | POA: Diagnosis not present

## 2023-01-18 DIAGNOSIS — Z5189 Encounter for other specified aftercare: Secondary | ICD-10-CM

## 2023-01-18 DIAGNOSIS — F1590 Other stimulant use, unspecified, uncomplicated: Secondary | ICD-10-CM | POA: Diagnosis not present

## 2023-01-18 DIAGNOSIS — R443 Hallucinations, unspecified: Secondary | ICD-10-CM | POA: Diagnosis present

## 2023-01-18 DIAGNOSIS — Z5181 Encounter for therapeutic drug level monitoring: Secondary | ICD-10-CM | POA: Insufficient documentation

## 2023-01-18 DIAGNOSIS — Z79899 Other long term (current) drug therapy: Secondary | ICD-10-CM

## 2023-01-18 LAB — CBC WITH DIFFERENTIAL/PLATELET
Abs Immature Granulocytes: 0.01 10*3/uL (ref 0.00–0.07)
Basophils Absolute: 0 10*3/uL (ref 0.0–0.1)
Basophils Relative: 1 %
Eosinophils Absolute: 0.4 10*3/uL (ref 0.0–0.5)
Eosinophils Relative: 8 %
HCT: 38 % — ABNORMAL LOW (ref 39.0–52.0)
Hemoglobin: 12.7 g/dL — ABNORMAL LOW (ref 13.0–17.0)
Immature Granulocytes: 0 %
Lymphocytes Relative: 29 %
Lymphs Abs: 1.3 10*3/uL (ref 0.7–4.0)
MCH: 31.8 pg (ref 26.0–34.0)
MCHC: 33.4 g/dL (ref 30.0–36.0)
MCV: 95.2 fL (ref 80.0–100.0)
Monocytes Absolute: 0.5 10*3/uL (ref 0.1–1.0)
Monocytes Relative: 12 %
Neutro Abs: 2.3 10*3/uL (ref 1.7–7.7)
Neutrophils Relative %: 50 %
Platelets: 136 10*3/uL — ABNORMAL LOW (ref 150–400)
RBC: 3.99 MIL/uL — ABNORMAL LOW (ref 4.22–5.81)
RDW: 15.1 % (ref 11.5–15.5)
WBC: 4.4 10*3/uL (ref 4.0–10.5)
nRBC: 0 % (ref 0.0–0.2)

## 2023-01-18 LAB — COMPREHENSIVE METABOLIC PANEL
ALT: 45 U/L — ABNORMAL HIGH (ref 0–44)
AST: 54 U/L — ABNORMAL HIGH (ref 15–41)
Albumin: 3.6 g/dL (ref 3.5–5.0)
Alkaline Phosphatase: 86 U/L (ref 38–126)
Anion gap: 8 (ref 5–15)
BUN: 14 mg/dL (ref 6–20)
CO2: 24 mmol/L (ref 22–32)
Calcium: 9.5 mg/dL (ref 8.9–10.3)
Chloride: 105 mmol/L (ref 98–111)
Creatinine, Ser: 0.82 mg/dL (ref 0.61–1.24)
GFR, Estimated: >60 mL/min (ref >60)
Glucose, Bld: 111 mg/dL — ABNORMAL HIGH (ref 70–99)
Potassium: 4.2 mmol/L (ref 3.5–5.1)
Sodium: 137 mmol/L (ref 135–145)
Total Bilirubin: 0.5 mg/dL (ref 0.3–1.2)
Total Protein: 6.4 g/dL — ABNORMAL LOW (ref 6.5–8.1)

## 2023-01-18 LAB — BRAIN NATRIURETIC PEPTIDE: B Natriuretic Peptide: 35.7 pg/mL (ref 0.0–100.0)

## 2023-01-18 LAB — ETHANOL: Alcohol, Ethyl (B): <10 mg/dL (ref <10)

## 2023-01-18 LAB — RAPID URINE DRUG SCREEN, HOSP PERFORMED
Amphetamines: POSITIVE — AB
Barbiturates: NOT DETECTED
Benzodiazepines: NOT DETECTED
Cocaine: POSITIVE — AB
Opiates: NOT DETECTED
Tetrahydrocannabinol: NOT DETECTED

## 2023-01-18 LAB — CBG MONITORING, ED: Glucose-Capillary: 96 mg/dL (ref 70–99)

## 2023-01-18 NOTE — Discharge Instructions (Addendum)
You were seen in the emergency department for your wound recheck.  We did not see any parasites in the emergency department and your wound appears to be healing appropriately.  You can follow-up with dermatology to have your wound rechecked and can follow-up in the behavioral health urgent care as needed for any hallucinations or help with your substance use.  You should return to the emergency department if you have spreading redness from your wounds, fevers or any thoughts of wanting to hurt yourself or others or any other new or concerning symptoms.

## 2023-01-18 NOTE — Progress Notes (Signed)
   01/18/23 1354  [facility] Triage Screening (Walk-ins at [facility] only)  How Did You Hear About [place]? Family/Friend  What Is the Reason for Your Visit/Call Today? Pt presents to [facility] voluntarily and accompanied with his mother, Domingo Dimes, complaints of seeing bugs and worms on his body, "they are crawling all over me, they are in my eyes".  Pt admits to experiencing episode of paranoia.  Pt denis SI, HI and AH.  Pt reports sleeping one hours during the night. Pt's mom reports he has been crying and yelling all night.  Pt admits to prior MH diagnosis or prescribed medication fory symptom managment.  How Long Has This Been Causing You Problems? 1 wk - 1 month  Have You Recently Had Any Thoughts About Hurting Yourself? No  Are You Planning to Commit Suicide/Harm Yourself At This time? No  Have you Recently Had Thoughts About Hurting Someone Karolee Ohs? No  Are You Planning To Harm Someone At This Time? No  Are you currently experiencing any auditory, visual or other hallucinations? Yes  Please explain the hallucinations you are currently experiencing: Pt reports seeing bugs and worms on his body  Have You Used Any Alcohol or Drugs in the Past 24 Hours? Yes  How long ago did you use Drugs or Alcohol? 02/05/23, beer  What Did You Use and How Much? beer  Do you have any current medical co-morbidities that require immediate attention? No  Clinician description of patient physical appearance/behavior: Pt casual , utilizing a cane  What Do You Feel Would Help You the Most Today? Treatment for Depression or other mood problem  If access to [facility] was not available, would you have sought care in the Emergency Department? Yes  Determination of Need Urgent (48 hours)  Options For Referral [facility]

## 2023-01-18 NOTE — ED Provider Notes (Signed)
Behavioral Health Urgent Care Medical Screening Exam  Patient Name: Randy Barnes MRN: [id number] Date of Evaluation: 01/18/23 Chief Complaint:  "I can't live this way anymore. I need help with these bugs" Diagnosis:  Final diagnoses:  Medication management    History of Present illness: Randy Barnes, 62 y.o., male patient seen face to face by this provider, consulted with Dr. Dairl Ponder; and chart reviewed on 01/18/23.  On evaluation Randy Barnes reports he needs help.  He has been off his medications for more than 14 months because he says the prison took him off his medication.  He is feeling parasites under his skin and says he is often able to remove them.  Patient's mother verified she has seen these parasites and then proceeded to show photos of the parasites.  The objects in the photos are difficult to see clearly and identify.  Patient states he was recently in the emergency department and "they told me it was all in my head". Patient and mother said they have seen the bugs and Mom expressed concern regarding an untreated parasite infection.   Patient is diabetic. He lifted his shirt to reveal several scabs around his belly button and stomach.  He says bugs are coming out of the belly button and out his rectum regularly. He next removed his shoes and his right great toe is missing half the toenail; the wound is open and wet.  His left great toenail is intact, but his toe is discolored underneath. Patient is being sent to [facility] for medical clearance.  Dr Andria Meuse is accepting.          During evaluation Randy Barnes is seated in a chair in no moderate distress.  He is alert, oriented x 4, cooperative and attentive.  His mood is depressed with congruent affect.  He has normal speech, and behavior.  Objectively there is possible evidence of psychosis/mania or delusional thinking; aside from the parasite discussion, no other potential delusions.  Patient is able to converse  coherently, goal directed thoughts, no distractibility, or pre-occupation.  He  denies homicidal ideation, psychosis, and paranoia.  Patient endorses suicidal ideation that is contingent upon the outcome of the "parasites". Patient answered questions appropriately.  Patient requires inpatient psychiatric hospitalization for stabilization and treatment after being medically cleared.    Flowsheet Row ED from 01/12/2023 in [facility] Emergency Department at [facility] Most recent reading at 01/12/2023  3:40 PM ED from 01/12/2023 in [facility] Emergency Department at [facility] Most recent reading at 01/12/2023  3:14 AM ED from 01/10/2023 in [facility] Urgent Care at [facility] Most recent reading at 01/10/2023  7:55 PM  C-SSRS RISK CATEGORY No Risk No Risk No Risk       Psychiatric Specialty Exam  Presentation  General Appearance:Appropriate for Environment  Eye Contact:Good  Speech:Clear and Coherent  Speech Volume:Normal  Handedness:No data recorded  Mood and Affect  Mood: Depressed  Affect: Congruent   Thought Process  Thought Processes: Coherent  Descriptions of Associations:No data recorded Orientation:Full (Time, Place and Person)  Thought Content:WDL    Hallucinations:None (Possible tactile hallucinations)  Ideas of Reference:None  Suicidal Thoughts:Yes, Passive Without Intent; Without Plan  Homicidal Thoughts:No   Sensorium  Memory: Immediate Good; Recent Good; Remote Good  Judgment: Fair  Insight: Fair   Chartered certified accountant: Fair  Attention Span: Fair  Recall: [employer] of Knowledge: Fair  Language: Fair   Psychomotor Activity  Psychomotor  Activity: Normal   Assets  Assets: Communication Skills; Desire for Improvement; Social Support   Sleep  Sleep: Poor  Number of hours:  2   Physical Exam: Physical Exam Vitals and nursing note reviewed.  Eyes:     Pupils: Pupils are  equal, round, and reactive to light.  Pulmonary:     Effort: Pulmonary effort is normal.  Skin:    General: Skin is dry.  Neurological:     Mental Status: He is alert and oriented to person, place, and time.    Review of Systems  Skin:  Positive for itching and rash.       Wounds all over skin, right great toe wound, left great toe discoloration  Psychiatric/Behavioral:  Positive for depression and substance abuse.   All other systems reviewed and are negative.  Blood pressure (!) 144/102, pulse 93, temperature 98.8 F (37.1 C), temperature source Oral, resp. rate 18, SpO2 98%. There is no height or weight on file to calculate BMI.  Musculoskeletal: Strength & Muscle Tone: within normal limits Gait & Station: normal Patient leans: N/A   [facility] MSE Discharge Disposition for Follow up and Recommendations: Based on my evaluation the patient appears to have an emergency medical condition for which I recommend the patient be transferred to the emergency department for further evaluation.    Thomes Lolling, NP 01/18/2023, 3:37 PM

## 2023-01-18 NOTE — ED Notes (Signed)
Pt belongings placed in locker number 2 Cell phone Daiva Huge carry bag along with belongings bag

## 2023-01-18 NOTE — ED Triage Notes (Signed)
Pt here from [facility]  with c/o SI and states that he has p[parasites under in his skin he has sores on his abd that he has been picking at , pt admits to using meth earlier this week

## 2023-01-18 NOTE — ED Provider Notes (Signed)
[place] EMERGENCY DEPARTMENT AT [facility] Provider Note   CSN: [id number] Arrival date & time: 01/18/23  1707     History  Chief Complaint  Patient presents with   Suicidal    CLAYTON BOSSERMAN is a 62 y.o. male.  Patient is a 62 year old male with past medical history of hypertension, diabetes and methamphetamine use presenting to the emergency department from [place] for medical clearance.  Patient has been complaining of wounds to his umbilicus and his toe and he states that there are "parasites crawling out of the wounds."  He was seen in the ED on 10/27 for this and started on antibiotics.  The patient was told that this could be related to psychiatric hallucinations which may be from his methamphetamine use and presented to be helped today however with his wounds was recommended for medical clearance.  Patient denies any fevers.  He states that he has had some leg swelling and shortness of breath "going on for a while", denies any chest pain.  Patient denies any SI or HI.  He reports that he would only use methamphetamine occasionally and states that he thought that he saw the parasites prior to smoking meth.  The history is provided by the patient and medical records.       Home Medications Prior to Admission medications   Medication Sig Start Date End Date Taking? Authorizing Provider  acyclovir (ZOVIRAX) 800 MG tablet Take 1 tablet (800 mg total) by mouth 5 (five) times daily for 7 days. 01/12/23 01/21/23  Loetta Rough, MD  albuterol (VENTOLIN HFA) 108 (90 Base) MCG/ACT inhaler Inhale 2 puffs into the lungs every 6 (six) hours as needed for wheezing or shortness of breath. 08/01/21   Grayce Sessions, NP  atenolol (TENORMIN) 25 MG tablet Take 1 tablet (25 mg total) by mouth daily. 05/29/21   Claiborne Rigg, NP  budesonide-formoterol [facility]) 160-4.5 MCG/ACT inhaler Inhale 2 puffs into the lungs 2 (two) times daily. 01/11/21 01/11/22  Grayce Sessions, NP   cariprazine (VRAYLAR) 1.5 MG capsule Take 1 capsule (1.5 mg total) by mouth daily. 04/20/21   Arfeen, Phillips Grout, MD  clindamycin (CLEOCIN) 150 MG capsule Take 1 capsule (150 mg total) by mouth every 6 (six) hours. 01/12/23   Loetta Rough, MD  erythromycin ophthalmic ointment Place a 1/2 inch ribbon of ointment into the lower eyelid. Place 1/2 inch ribbon to upper and lower eyelid border. 01/07/23   Lonell Grandchild, MD  gabapentin (NEURONTIN) 800 MG tablet Take 1 tablet (800 mg total) by mouth 4 (four) times daily. 01/11/21   Grayce Sessions, NP  Glucose Blood (BLOOD GLUCOSE TEST STRIPS) STRP Use as directed 03/24/19   Fulp, Cammie, MD  hydrOXYzine (ATARAX) 25 MG tablet Take 1 tablet (25 mg total) by mouth every 6 (six) hours. 01/12/23   Roxy Horseman, PA-C  ibuprofen (ADVIL) 200 MG tablet Take 600 mg by mouth every 6 (six) hours as needed for moderate pain.    [provider]  lisinopril (ZESTRIL) 5 MG tablet Take 1 tablet (5 mg total) by mouth daily. 03/16/21   Hoy Register, MD  mebendazole (VERMOX) 100 MG chewable tablet Chew 1 tablet (100 mg total) by mouth 2 (two) times daily. 01/10/23   Rodriguez-Southworth, Nettie Elm, PA-C  omeprazole (PRILOSEC) 20 MG capsule Take 1 capsule (20 mg total) by mouth 2 (two) times daily before a meal. DX:  K21.9 GERD 08/17/21   Grayce Sessions, NP  permethrin (ELIMITE) 5 % cream Apply to affected area once 01/07/23   Lonell Grandchild, MD  sildenafil (REVATIO) 20 MG tablet TAKE 3 TABLETS BY MOUTH 1/2 HOUR PRIOR TO INTERCOURSE. LIMIT USE TO 3 TABS/ 24 HOURS 10/19/21   Claiborne Rigg, NP  tamsulosin [facility]) 0.4 MG CAPS capsule Take 1 capsule by mouth twice daily 10/19/21   Claiborne Rigg, NP      Allergies    Penicillins and Guaifenesin & derivatives    Review of Systems   Review of Systems  Physical Exam Updated Vital Signs BP (!) 172/102 (BP Location: Left Arm)   Pulse 80   Temp 98.9 F (37.2 C)   Resp 18   SpO2 100%  Physical  Exam Vitals and nursing note reviewed.  Constitutional:      General: He is not in acute distress.    Appearance: Normal appearance.  HENT:     Head: Normocephalic.     Nose: Nose normal.     Mouth/Throat:     Mouth: Mucous membranes are moist.     Pharynx: Oropharynx is clear.  Eyes:     Extraocular Movements: Extraocular movements intact.     Conjunctiva/sclera: Conjunctivae normal.  Cardiovascular:     Rate and Rhythm: Normal rate and regular rhythm.     Heart sounds: Normal heart sounds.  Pulmonary:     Effort: Pulmonary effort is normal.     Breath sounds: Normal breath sounds.  Abdominal:     General: Abdomen is flat.     Palpations: Abdomen is soft.     Tenderness: There is no abdominal tenderness.  Musculoskeletal:        General: Normal range of motion.     Cervical back: Normal range of motion.     Right lower leg: Edema (2+) present.     Left lower leg: Edema (2+) present.  Skin:    General: Skin is warm and dry.     Comments: Excoriations around umbilicus with crusting but no erythema, warmth or active drainage Crusting wound to first toe but no surrounding erythema, warmth or drainage  Neurological:     General: No focal deficit present.     Mental Status: He is alert and oriented to person, place, and time.  Psychiatric:     Comments: Pressured speech Cooperative        ED Results / Procedures / Treatments   Labs (all labs ordered are listed, but only abnormal results are displayed) Labs Reviewed  COMPREHENSIVE METABOLIC PANEL - Abnormal; Notable for the following components:      Result Value   Glucose, Bld 111 (*)    Total Protein 6.4 (*)    AST 54 (*)    ALT 45 (*)    All other components within normal limits  RAPID URINE DRUG SCREEN, HOSP PERFORMED - Abnormal; Notable for the following components:   Cocaine POSITIVE (*)    Amphetamines POSITIVE (*)    All other components within normal limits  CBC WITH DIFFERENTIAL/PLATELET - Abnormal;  Notable for the following components:   RBC 3.99 (*)    Hemoglobin 12.7 (*)    HCT 38.0 (*)    Platelets 136 (*)    All other components within normal limits  ETHANOL  BRAIN NATRIURETIC PEPTIDE  CBG MONITORING, ED    EKG None  Radiology DG Chest 1 View  Result Date: 01/18/2023 CLINICAL DATA:  141880 SOB (shortness of breath) 141880 EXAM: CHEST  1 VIEW COMPARISON:  Chest x-ray 03/17/2019, CT chest 08/20/2015 FINDINGS: The heart and mediastinal contours are unchanged. Aortic calcification. No focal consolidation. No pulmonary edema. No pleural effusion. No pneumothorax. No acute osseous abnormality. IMPRESSION: No active disease. Electronically Signed   By: Tish Frederickson M.D.   On: 01/18/2023 20:08    Procedures Procedures    Medications Ordered in ED Medications - No data to display  ED Course/ Medical Decision Making/ A&P Clinical Course as of 01/18/23 2227  Sat Jan 18, 2023  2023 No pulm edema on CXR, labs within normal range. [VK]  2146 Patient is medically cleared for psych eval. [VK]  2217 Initially recommended for inpatient psych by Phebe Colla, NP who he sought be held earlier in the day however patient reports that he would not like to stay and wants to be discharged home with outpatient follow-up.  He is not suicidal or homicidal, is having mild delusions but is calm and cooperative and has no criteria to be placed under IVC.  I discussed with [facility] therapist and Addison Naegeli, NP at [facility] who states he can be cleared for discharge if he does not meet criteria for IVC. He will be given outpatient follow up and was given strict return precautions. [VK]    Clinical Course User Index [VK] Rexford Maus, DO                                 Medical Decision Making This patient presents to the ED with chief complaint(s) of wounds, hallucinations with pertinent past medical history of HTN, DM, polysubstance use which further complicates the  presenting complaint. The complaint involves an extensive differential diagnosis and also carries with it a high risk of complications and morbidity.    The differential diagnosis includes no signs of cellulitis or abscess, no signs of sepsis, electrolyte abnormality, hyperglycemic crisis, intoxication, meth induced psychosis, no signs of alcohol withdrawal, CHF, renal dysfunction, hypoalbuminemia  Additional history obtained: Additional history obtained from N/A Records reviewed recent ED records, [facility] records  ED Course and Reassessment: On patient's arrival he is hemodynamically stable in no acute distress.  His wounds appear to be well-healing around his umbilicus and his toe without acute infection.  He will have labs performed to evaluate for electrolyte derangement, no signs of sepsis on exam.  He does have pitting edema in his lower legs and has been complaining of shortness of breath for several weeks and will have chest x-ray and BNP to evaluate for CHF.  Patient is agreeable for psychiatric evaluation at this time.  Independent labs interpretation:  The following labs were independently interpreted: Within normal range  Independent visualization of imaging: - I independently visualized the following imaging with scope of interpretation limited to determining acute life threatening conditions related to emergency care: Chest x-ray, which revealed no acute disease  Consultation: - Consulted or discussed management/test interpretation w/ external professional: TTS  Consideration for admission or further workup: Considered psychiatric admission however the patient does not meet criteria for IVC and he would prefer outpatient management Social Determinants of health: Polysubstance use    Amount and/or Complexity of Data Reviewed Labs: ordered. Radiology: ordered.          Final Clinical Impression(s) / ED Diagnoses Final diagnoses:  None    Rx / DC Orders ED Discharge  Orders     None         Theresia Lo,  Cecile Sheerer, DO 01/18/23 2227

## 2023-01-20 ENCOUNTER — Other Ambulatory Visit: Payer: Self-pay

## 2023-01-31 ENCOUNTER — Emergency Department (HOSPITAL_COMMUNITY)
Admission: EM | Admit: 2023-01-31 | Discharge: 2023-01-31 | Disposition: A | Discharge status: Home / Self Care | Payer: Medicaid Other

## 2023-01-31 ENCOUNTER — Emergency Department (HOSPITAL_COMMUNITY): Payer: Medicaid Other

## 2023-01-31 ENCOUNTER — Other Ambulatory Visit: Payer: Self-pay

## 2023-01-31 DIAGNOSIS — F410 Panic disorder [episodic paroxysmal anxiety] without agoraphobia: Secondary | ICD-10-CM | POA: Insufficient documentation

## 2023-01-31 DIAGNOSIS — K5900 Constipation, unspecified: Secondary | ICD-10-CM | POA: Diagnosis present

## 2023-01-31 DIAGNOSIS — F22 Delusional disorders: Secondary | ICD-10-CM

## 2023-01-31 DIAGNOSIS — R1032 Left lower quadrant pain: Secondary | ICD-10-CM | POA: Insufficient documentation

## 2023-01-31 DIAGNOSIS — F1910 Other psychoactive substance abuse, uncomplicated: Secondary | ICD-10-CM | POA: Diagnosis not present

## 2023-01-31 DIAGNOSIS — I10 Essential (primary) hypertension: Secondary | ICD-10-CM | POA: Diagnosis not present

## 2023-01-31 DIAGNOSIS — Z79899 Other long term (current) drug therapy: Secondary | ICD-10-CM | POA: Diagnosis not present

## 2023-01-31 DIAGNOSIS — F064 Anxiety disorder due to known physiological condition: Secondary | ICD-10-CM | POA: Diagnosis present

## 2023-01-31 LAB — CBC WITH DIFFERENTIAL/PLATELET
Abs Immature Granulocytes: 0.02 10*3/uL (ref 0.00–0.07)
Basophils Absolute: 0 10*3/uL (ref 0.0–0.1)
Basophils Relative: 1 %
Eosinophils Absolute: 0.3 10*3/uL (ref 0.0–0.5)
Eosinophils Relative: 5 %
HCT: 42 % (ref 39.0–52.0)
Hemoglobin: 13.7 g/dL (ref 13.0–17.0)
Immature Granulocytes: 0 %
Lymphocytes Relative: 32 %
Lymphs Abs: 1.9 10*3/uL (ref 0.7–4.0)
MCH: 31.4 pg (ref 26.0–34.0)
MCHC: 32.6 g/dL (ref 30.0–36.0)
MCV: 96.3 fL (ref 80.0–100.0)
Monocytes Absolute: 0.5 10*3/uL (ref 0.1–1.0)
Monocytes Relative: 9 %
Neutro Abs: 3.2 10*3/uL (ref 1.7–7.7)
Neutrophils Relative %: 53 %
Platelets: 117 10*3/uL — ABNORMAL LOW (ref 150–400)
RBC: 4.36 MIL/uL (ref 4.22–5.81)
RDW: 15.3 % (ref 11.5–15.5)
WBC: 6.1 10*3/uL (ref 4.0–10.5)
nRBC: 0 % (ref 0.0–0.2)

## 2023-01-31 LAB — LIPASE, BLOOD: Lipase: 28 U/L (ref 11–51)

## 2023-01-31 LAB — COMPREHENSIVE METABOLIC PANEL
ALT: 21 U/L (ref 0–44)
AST: 26 U/L (ref 15–41)
Albumin: 4 g/dL (ref 3.5–5.0)
Alkaline Phosphatase: 101 U/L (ref 38–126)
Anion gap: 8 (ref 5–15)
BUN: 15 mg/dL (ref 6–20)
CO2: 24 mmol/L (ref 22–32)
Calcium: 9.2 mg/dL (ref 8.9–10.3)
Chloride: 101 mmol/L (ref 98–111)
Creatinine, Ser: 0.75 mg/dL (ref 0.61–1.24)
GFR, Estimated: >60 mL/min (ref >60)
Glucose, Bld: 134 mg/dL — ABNORMAL HIGH (ref 70–99)
Potassium: 3.7 mmol/L (ref 3.5–5.1)
Sodium: 133 mmol/L — ABNORMAL LOW (ref 135–145)
Total Bilirubin: 0.4 mg/dL (ref <1.2)
Total Protein: 7.5 g/dL (ref 6.5–8.1)

## 2023-01-31 MED ORDER — IOHEXOL 300 MG/ML  SOLN
100.0000 mL | Freq: Once | INTRAMUSCULAR | Status: AC | PRN
  Administered 2023-01-31: 100 mL via INTRAVENOUS

## 2023-01-31 MED ORDER — POLYETHYLENE GLYCOL 3350 17 G PO PACK: 17.0000 g | PACK | Freq: Every day | ORAL | Status: DC

## 2023-01-31 NOTE — ED Notes (Addendum)
Pt is upset and angry. He is wanting to leave and its stating that no one here is helping him. EDP was notified. Pt is not IVC at this time.

## 2023-01-31 NOTE — Discharge Instructions (Signed)
  Discharge recommendations:  Patient is to take medications as prescribed. Please see information for follow-up appointment with psychiatry and therapy. Please follow up with your primary care provider for all medical related needs.   Therapy: We recommend that patient participate in individual therapy to address mental health concerns.  Medications: The patient or guardian is to contact a medical professional and/or outpatient provider to address any new side effects that develop. The patient or guardian should update outpatient providers of any new medications and/or medication changes.   Atypical antipsychotics: If you are prescribed an atypical antipsychotic, it is recommended that your height, weight, BMI, blood pressure, fasting lipid panel, and fasting blood sugar be monitored by your outpatient providers.  Safety:  The patient should abstain from use of illicit substances/drugs and abuse of any medications. If symptoms worsen or do not continue to improve or if the patient becomes actively suicidal or homicidal then it is recommended that the patient return to the closest hospital emergency department, the [facility], or call 911 for further evaluation and treatment. National Suicide Prevention Lifeline 1-800-SUICIDE or (563)489-1432.  About 988 988 offers 24/7 access to trained crisis counselors who can help people experiencing mental health-related distress. People can call or text 988 or chat 988lifeline.org for themselves or if they are worried about a loved one who may need crisis support.  Crisis Mobile: [facility]:                     (407)707-4144 (for crisis response 24 hours a day) [facility] Hotline:                                            650-060-7378     You are encouraged to follow up with [facility] for outpatient treatment.  Walk in/ Open Access Hours: Monday - Friday 8AM - 11AM (To see  provider and therapist)  - Please arrive at 7:15, as patients are seen on a first come first served basis. Friday - 1PM - 4PM (To see therapist only)  [facility] [address] [place], [place] [phone]

## 2023-01-31 NOTE — ED Notes (Signed)
Pt wants to leave. Pts belongings were given his belongings.

## 2023-01-31 NOTE — ED Notes (Signed)
IV lock was removed prior to moving pt to room 35.
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
## 2023-01-31 NOTE — ED Notes (Signed)
[facility] called pts mother for collateral. A HIPAA compliant message was left to return the call.   Jacquelynn Cree, [facility]  01/31/23
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
## 2023-01-31 NOTE — ED Triage Notes (Addendum)
Pt BIBA from home. C/O parasites trying to come out of skin, and leg cramps d/t the parasites.  Pt also states they have not had a BM in over a week  Hx of similar complaints  AOx4

## 2023-01-31 NOTE — ED Provider Notes (Addendum)
[place] EMERGENCY DEPARTMENT AT [facility] Provider Note   CSN: [id number] Arrival date & time: 01/31/23  0730     History  Chief Complaint  Patient presents with   Parasites   Constipation    Randy Barnes is a 62 y.o. male.  62 year old male with past medical history of methamphetamine abuse as well as hypertension and delusional parasitosis presenting to the emergency department today with complaints of constipation and abdominal pain.  The patient is also bringing up what appeared to be chronic issues of being that he has parasites coming from wounds on his legs and umbilicus.  The patient has been seen here multiple times for this.  The constipation abdominal discomfort is new.  Patient states that the abdominal discomfort about the left lower quadrant.  He states that he has not had a bowel movement in 5 days.  Reports passing occasional flatus but this is decreased compared to normal.  He denies any associated nausea or vomiting.  Denies any fevers.  He came to the ER today for further evaluation regarding this.   Constipation      Home Medications Prior to Admission medications   Medication Sig Start Date End Date Taking? Authorizing Provider  albuterol (VENTOLIN HFA) 108 (90 Base) MCG/ACT inhaler Inhale 2 puffs into the lungs every 6 (six) hours as needed for wheezing or shortness of breath. 08/01/21   Grayce Sessions, NP  atenolol (TENORMIN) 25 MG tablet Take 1 tablet (25 mg total) by mouth daily. 05/29/21   Claiborne Rigg, NP  budesonide-formoterol [facility]) 160-4.5 MCG/ACT inhaler Inhale 2 puffs into the lungs 2 (two) times daily. 01/11/21 01/11/22  Grayce Sessions, NP  cariprazine (VRAYLAR) 1.5 MG capsule Take 1 capsule (1.5 mg total) by mouth daily. 04/20/21   Arfeen, Phillips Grout, MD  clindamycin (CLEOCIN) 150 MG capsule Take 1 capsule (150 mg total) by mouth every 6 (six) hours. 01/12/23   Loetta Rough, MD  erythromycin ophthalmic ointment Place  a 1/2 inch ribbon of ointment into the lower eyelid. Place 1/2 inch ribbon to upper and lower eyelid border. 01/07/23   Lonell Grandchild, MD  gabapentin (NEURONTIN) 800 MG tablet Take 1 tablet (800 mg total) by mouth 4 (four) times daily. 01/11/21   Grayce Sessions, NP  Glucose Blood (BLOOD GLUCOSE TEST STRIPS) STRP Use as directed 03/24/19   Fulp, Cammie, MD  hydrOXYzine (ATARAX) 25 MG tablet Take 1 tablet (25 mg total) by mouth every 6 (six) hours. 01/12/23   Roxy Horseman, PA-C  ibuprofen (ADVIL) 200 MG tablet Take 600 mg by mouth every 6 (six) hours as needed for moderate pain.    [provider]  lisinopril (ZESTRIL) 5 MG tablet Take 1 tablet (5 mg total) by mouth daily. 03/16/21   Hoy Register, MD  mebendazole (VERMOX) 100 MG chewable tablet Chew 1 tablet (100 mg total) by mouth 2 (two) times daily. 01/10/23   Rodriguez-Southworth, Nettie Elm, PA-C  omeprazole (PRILOSEC) 20 MG capsule Take 1 capsule (20 mg total) by mouth 2 (two) times daily before a meal. DX:  K21.9 GERD 08/17/21   Grayce Sessions, NP  permethrin (ELIMITE) 5 % cream Apply to affected area once 01/07/23   Lonell Grandchild, MD  sildenafil (REVATIO) 20 MG tablet TAKE 3 TABLETS BY MOUTH 1/2 HOUR PRIOR TO INTERCOURSE. LIMIT USE TO 3 TABS/ 24 HOURS 10/19/21   Claiborne Rigg, NP  tamsulosin (FLOMAX) 0.4 MG CAPS capsule Take 1 capsule by  mouth twice daily 10/19/21   Claiborne Rigg, NP      Allergies    Penicillins and Guaifenesin & derivatives    Review of Systems   Review of Systems  Gastrointestinal:  Positive for constipation.  Psychiatric/Behavioral:  Positive for hallucinations.   All other systems reviewed and are negative.   Physical Exam Updated Vital Signs BP (!) 158/94 (BP Location: Right Arm)   Pulse 76   Temp (!) 97.5 F (36.4 C) (Oral)   Resp 18   Ht 6\' 3"  (1.905 m)   Wt 88 kg   SpO2 100%   BMI 24.25 kg/m  Physical Exam Vitals and nursing note reviewed.   Gen: NAD Eyes: PERRL,  EOMI HEENT: no oropharyngeal swelling Neck: trachea midline Resp: clear to auscultation bilaterally Card: RRR, no murmurs, rubs, or gallops Abd: Mildly distended, mild diffuse tenderness with no guarding or rebound Extremities: no calf tenderness, no edema Vascular: 2+ radial pulses bilaterally, 2+ DP pulses bilaterally Skin: Patient does have a few small superficial scabs over his right shin as well as his right foot with some mild surrounding erythema, no ulcerations noted Psyc: acting appropriately   ED Results / Procedures / Treatments   Labs (all labs ordered are listed, but only abnormal results are displayed) Labs Reviewed  CBC WITH DIFFERENTIAL/PLATELET - Abnormal; Notable for the following components:      Result Value   Platelets 117 (*)    All other components within normal limits  COMPREHENSIVE METABOLIC PANEL - Abnormal; Notable for the following components:   Sodium 133 (*)    Glucose, Bld 134 (*)    All other components within normal limits  LIPASE, BLOOD    EKG None  Radiology CT ABDOMEN PELVIS W CONTRAST  Result Date: 01/31/2023 CLINICAL DATA:  Left lower quadrant pain.  Constipation EXAM: CT ABDOMEN AND PELVIS WITH CONTRAST TECHNIQUE: Multidetector CT imaging of the abdomen and pelvis was performed using the standard protocol following bolus administration of intravenous contrast. RADIATION DOSE REDUCTION: This exam was performed according to the departmental dose-optimization program which includes automated exposure control, adjustment of the mA and/or kV according to patient size and/or use of iterative reconstruction technique. CONTRAST:  OMNIPAQUE IOHEXOL 300 MG/ML  SOLN COMPARISON:  None Available. FINDINGS: Lower Chest: No acute findings. Hepatobiliary: No suspicious hepatic masses identified. Gallbladder is unremarkable. No evidence of biliary ductal dilatation. Pancreas:  No mass or inflammatory changes. Spleen: Within normal limits in size and  appearance. Adrenals/Urinary Tract: No suspicious masses identified. No evidence of ureteral calculi or hydronephrosis. Stomach/Bowel: No evidence of obstruction, inflammatory process or abnormal fluid collections. Normal appendix visualized. Moderate colonic stool burden noted. Vascular/Lymphatic: No pathologically enlarged lymph nodes. No acute vascular findings. Reproductive:  No mass or other significant abnormality. Other:  None. Musculoskeletal:  No suspicious bone lesions identified. IMPRESSION: No acute findings. Moderate colonic stool burden noted. Electronically Signed   By: Danae Orleans M.D.   On: 01/31/2023 10:24    Procedures Procedures    Medications Ordered in ED Medications  iohexol (OMNIPAQUE) 300 MG/ML solution 100 mL (100 mLs Intravenous Contrast Given 01/31/23 0906)    ED Course/ Medical Decision Making/ A&P                                 Medical Decision Making 62 year old male presenting to the emergency department today primarily for abdominal pain and constipation.  I will further  evaluate patient here with basic labs including LFTs and a lipase to evaluate for hepatobiliary pathology or pancreatitis.  Will obtain CT scan to evaluate for obstruction, diverticulitis, colitis, appendicitis, or other intra-abdominal pathology given the patient's complaints and age.  The wounds at the patient wants me to evaluate do not appear to be grossly infected.  The patient clearly has delusions and these are worrying him more.  He is requesting a TTS evaluation.  The patient evaluated here.  I do not think that on my evaluation he meets IVC criteria at this time but will discuss the patient's case with them to see about having him plugged in for future treatments as this does seem to be escalating.  The patient's labs are reassuring.  CT scan does show some constipation but no other acute findings.  The patient was evaluated by psych and eventually the decision was made that he would  better served with outpatient management.  Initially the patient was placed in observation his understanding was that he would need inpatient treatment but after evaluation by our psychiatric team they recommended discharge with outpatient follow-up.  He is discharged with return precautions.  Amount and/or Complexity of Data Reviewed Labs: ordered. Radiology: ordered.  Risk Prescription drug management.           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

## 2023-02-24 ENCOUNTER — Ambulatory Visit: Payer: Medicaid Other | Attending: Nurse Practitioner | Admitting: Nurse Practitioner

## 2023-02-24 ENCOUNTER — Encounter: Payer: Self-pay | Admitting: Nurse Practitioner

## 2023-02-24 VITALS — BP 132/77 | HR 100 | Ht 75.0 in | Wt 213.0 lb

## 2023-02-24 DIAGNOSIS — M48062 Spinal stenosis, lumbar region with neurogenic claudication: Secondary | ICD-10-CM | POA: Diagnosis not present

## 2023-02-24 DIAGNOSIS — I10 Essential (primary) hypertension: Secondary | ICD-10-CM | POA: Diagnosis not present

## 2023-02-24 DIAGNOSIS — N401 Enlarged prostate with lower urinary tract symptoms: Secondary | ICD-10-CM

## 2023-02-24 DIAGNOSIS — F17200 Nicotine dependence, unspecified, uncomplicated: Secondary | ICD-10-CM

## 2023-02-24 DIAGNOSIS — J4489 Other specified chronic obstructive pulmonary disease: Secondary | ICD-10-CM | POA: Diagnosis not present

## 2023-02-24 DIAGNOSIS — E0840 Diabetes mellitus due to underlying condition with diabetic neuropathy, unspecified: Secondary | ICD-10-CM | POA: Diagnosis not present

## 2023-02-24 DIAGNOSIS — K219 Gastro-esophageal reflux disease without esophagitis: Secondary | ICD-10-CM

## 2023-02-24 DIAGNOSIS — R7989 Other specified abnormal findings of blood chemistry: Secondary | ICD-10-CM

## 2023-02-24 DIAGNOSIS — Z1211 Encounter for screening for malignant neoplasm of colon: Secondary | ICD-10-CM

## 2023-02-24 DIAGNOSIS — E785 Hyperlipidemia, unspecified: Secondary | ICD-10-CM

## 2023-02-24 DIAGNOSIS — N528 Other male erectile dysfunction: Secondary | ICD-10-CM

## 2023-02-24 MED ORDER — BUDESONIDE-FORMOTEROL FUMARATE 160-4.5 MCG/ACT IN AERO: 2.0000 | INHALATION_SPRAY | Freq: Two times a day (BID) | RESPIRATORY_TRACT | 1 refills | Status: DC

## 2023-02-24 MED ORDER — GABAPENTIN 800 MG PO TABS: 800.0000 mg | ORAL_TABLET | Freq: Four times a day (QID) | ORAL | 1 refills | Status: DC

## 2023-02-24 MED ORDER — ATENOLOL 25 MG PO TABS: 12.5000 mg | ORAL_TABLET | Freq: Every day | ORAL | 1 refills | Status: DC

## 2023-02-24 MED ORDER — SILDENAFIL CITRATE 20 MG PO TABS: ORAL_TABLET | ORAL | 3 refills | Status: DC

## 2023-02-24 MED ORDER — OMEPRAZOLE 20 MG PO CPDR: 20.0000 mg | DELAYED_RELEASE_CAPSULE | Freq: Two times a day (BID) | ORAL | 2 refills | Status: DC

## 2023-02-24 MED ORDER — LISINOPRIL 5 MG PO TABS: 5.0000 mg | ORAL_TABLET | Freq: Every day | ORAL | 1 refills | Status: AC

## 2023-02-24 MED ORDER — ALBUTEROL SULFATE HFA 108 (90 BASE) MCG/ACT IN AERS: 2.0000 | INHALATION_SPRAY | Freq: Four times a day (QID) | RESPIRATORY_TRACT | 3 refills | Status: AC | PRN

## 2023-02-24 NOTE — Progress Notes (Signed)
Assessment & Plan:  [name] was seen today for medical management of chronic issues.  Diagnoses and all orders for this visit:  Essential hypertension -     lisinopril (ZESTRIL) 5 MG tablet; Take 1 tablet (5 mg total) by mouth daily. -     CMP14+EGFR -     atenolol (TENORMIN) 25 MG tablet; Take 0.5 tablets (12.5 mg total) by mouth daily. Continue all antihypertensives as prescribed.  Reminded to bring in blood pressure log for follow  up appointment.  RECOMMENDATIONS: DASH/Mediterranean Diets are healthier choices for HTN.    Diabetes mellitus due to underlying condition, controlled, with diabetic neuropathy, without long-term current use of insulin (HCC) -     Urine Albumin/Creatinine with ratio (send out) [LAB689] -     Hemoglobin A1c -     CMP14+EGFR   Spinal stenosis of lumbar region with neurogenic claudication Symptoms well controlled -     gabapentin (NEURONTIN) 800 MG tablet; Take 1 tablet (800 mg total) by mouth 4 (four) times daily.  COPD with chronic bronchitis (HCC) -     albuterol (VENTOLIN HFA) 108 (90 Base) MCG/ACT inhaler; Inhale 2 puffs into the lungs every 6 (six) hours as needed for wheezing or shortness of breath. -     budesonide-formoterol (SYMBICORT) 160-4.5 MCG/ACT inhaler; Inhale 2 puffs into the lungs 2 (two) times daily.  Gastroesophageal reflux disease without esophagitis -     omeprazole (PRILOSEC) 20 MG capsule; Take 1 capsule (20 mg total) by mouth 2 (two) times daily before a meal. DX:  K21.9 GERD INSTRUCTIONS: Avoid GERD Triggers: acidic, spicy or fried foods, caffeine, coffee, sodas,  alcohol and chocolate.    Tobacco dependence -     CT CHEST LUNG CA SCREEN LOW DOSE W/O CM; Future  Colon cancer screening -     Ambulatory referral to Gastroenterology  Benign prostatic hyperplasia with lower urinary tract symptoms, symptom details unspecified -     PSA  Dyslipidemia, goal LDL below 70 -     Lipid panel INSTRUCTIONS: Work on a low fat, heart  healthy diet and participate in regular aerobic exercise program by working out at least 150 minutes per week; 5 days a week-30 minutes per day. Avoid red meat/beef/steak,  fried foods. junk foods, sodas, sugary drinks, unhealthy snacking, alcohol and smoking.  Drink at least 80 oz of water per day and monitor your carbohydrate intake daily.    Abnormal CBC -     CBC with Differential  Other male erectile dysfunction -     sildenafil (REVATIO) 20 MG tablet; TAKE 3 TABLETS BY MOUTH 1/2 HOUR PRIOR TO INTERCOURSE. LIMIT USE TO 3 TABS/ 24 HOURS    Patient has been counseled on age-appropriate routine health concerns for screening and prevention. These are reviewed and up-to-date. Referrals have been placed accordingly. Immunizations are up-to-date or declined.    Subjective:   Chief Complaint  Patient presents with   Medical Management of Chronic Issues    Randy Barnes 62 y.o. 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    HTN Blood pressure is well controlled. He is currently prescribed lisinopril  5 mg daily and tenormin 25 mg daily.   BP Readings from Last 3 Encounters:  02/24/23 132/77  01/31/23 (!) 158/94  01/18/23 (!) 172/102    Prediabetes A1c at goal of <6.5. Currently controlled with lifestyle modifications.  Lab Results  Component Value Date   HGBA1C 5.3 01/10/2021    Review of Systems  Constitutional:  Negative for fever, malaise/fatigue and weight loss.  HENT: Negative.  Negative for nosebleeds.   Eyes:  Negative.  Negative for blurred vision, double vision and photophobia.  Respiratory: Negative.  Negative for cough and shortness of breath.   Cardiovascular: Negative.  Negative for chest pain, palpitations and leg swelling.  Gastrointestinal: Negative.  Negative for heartburn, nausea and vomiting.  Genitourinary:        ED  Musculoskeletal: Negative.  Negative for myalgias.  Neurological: Negative.  Negative for dizziness, focal weakness, seizures and headaches.  Psychiatric/Behavioral:  Positive for substance abuse. Negative for suicidal ideas.     Past Medical History:  Diagnosis Date   Anxiety disorder due to general medical condition with panic attack    h/o hospitalizations for behavioral issues (1980s, 2000s)   Aorto-iliac atherosclerosis (HCC)    Arthritis    Cataract    COPD (chronic obstructive pulmonary disease) (HCC)    DDD (degenerative disc disease), lumbar    mild with dextroscoliosis   Depression    Emphysema of lung (HCC)    ?asthmatic bronchitis per prior PCP   GERD (gastroesophageal reflux disease)    Hepatitis C    History of stomach ulcers    Hyperlipidemia    Hypertension    Macular degeneration    MDD (major depressive disorder), recurrent episode, moderate (HCC)    Pneumonia    Polysubstance (including opioids) dependence, daily use (HCC) 10/2015   narcotics, heroin   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   CXR negative, pt states he was on antibiotic for 6 months   Problems related to release from prison 07/2014   Psoriasis 01/08/2015   Type 2 diabetes, uncontrolled, with neuropathy    Previous diabetic, no longer taking medications   Urine incontinence     Past Surgical History:  Procedure Laterality Date   STRABISMUS SURGERY     left eye vision loss, multiple surgeries   TONSILLECTOMY      Family History  Problem Relation Age of Onset   Diabetes Father    Heart failure Father    CAD Father    Stroke Mother    Hypertension Mother    Mental illness  Mother    Depression Mother    Irritable bowel syndrome Mother    Bipolar disorder Brother        ?   CAD Paternal Grandfather    Stroke Maternal Grandfather     Social History Reviewed with no changes to be made today.   Outpatient Medications Prior to Visit  Medication Sig Dispense Refill   lisinopril (ZESTRIL) 5 MG tablet Take 1 tablet (5 mg total) by mouth daily. 90 tablet 0   omeprazole (PRILOSEC) 20 MG capsule Take 1 capsule (20 mg total) by mouth 2 (two) times daily before a meal. DX:  K21.9 GERD 180 capsule 2   cariprazine (VRAYLAR) 1.5 MG capsule Take 1 capsule (1.5 mg total) by mouth daily. (Patient not taking: Reported on 02/24/2023) 30 capsule 1   hydrOXYzine (ATARAX) 25 MG tablet Take 1 tablet (25 mg total) by mouth every 6 (six) hours. (Patient not taking: Reported on 02/24/2023) 12 tablet  0   albuterol (VENTOLIN HFA) 108 (90 Base) MCG/ACT inhaler Inhale 2 puffs into the lungs every 6 (six) hours as needed for wheezing or shortness of breath. (Patient not taking: Reported on 02/24/2023) 6.7 g 3   atenolol (TENORMIN) 25 MG tablet Take 1 tablet (25 mg total) by mouth daily. (Patient not taking: Reported on 02/24/2023) 90 tablet 0   budesonide-formoterol (SYMBICORT) 160-4.5 MCG/ACT inhaler Inhale 2 puffs into the lungs 2 (two) times daily. (Patient not taking: Reported on 02/24/2023) 3 each 1   clindamycin (CLEOCIN) 150 MG capsule Take 1 capsule (150 mg total) by mouth every 6 (six) hours. (Patient not taking: Reported on 02/24/2023) 28 capsule 0   erythromycin ophthalmic ointment Place a 1/2 inch ribbon of ointment into the lower eyelid. Place 1/2 inch ribbon to upper and lower eyelid border. (Patient not taking: Reported on 02/24/2023) 3.5 g 0   gabapentin (NEURONTIN) 800 MG tablet Take 1 tablet (800 mg total) by mouth 4 (four) times daily. (Patient not taking: Reported on 02/24/2023) 360 tablet 1   Glucose Blood (BLOOD GLUCOSE TEST STRIPS) STRP Use as directed (Patient not taking:  Reported on 02/24/2023) 100 strip 11   ibuprofen (ADVIL) 200 MG tablet Take 600 mg by mouth every 6 (six) hours as needed for moderate pain. (Patient not taking: Reported on 02/24/2023)     mebendazole (VERMOX) 100 MG chewable tablet Chew 1 tablet (100 mg total) by mouth 2 (two) times daily. (Patient not taking: Reported on 02/24/2023) 1 tablet 0   permethrin (ELIMITE) 5 % cream Apply to affected area once (Patient not taking: Reported on 02/24/2023) 60 g 0   sildenafil (REVATIO) 20 MG tablet TAKE 3 TABLETS BY MOUTH 1/2 HOUR PRIOR TO INTERCOURSE. LIMIT USE TO 3 TABS/ 24 HOURS (Patient not taking: Reported on 02/24/2023) 30 tablet 3   tamsulosin (FLOMAX) 0.4 MG CAPS capsule Take 1 capsule by mouth twice daily (Patient not taking: Reported on 02/24/2023) 60 capsule 0   No facility-administered medications prior to visit.    Allergies  Allergen Reactions   Penicillins Hives and Swelling    Did it involve swelling of the face/tongue/throat, SOB, or low BP? No Did it involve sudden or severe rash/hives, skin peeling, or any reaction on the inside of your mouth or nose? Yes Did you need to seek medical attention at a hospital or doctor's office? Yes When did it last happen?      childhood allergy If all above answers are "NO", may proceed with cephalosporin use.    Guaifenesin & Derivatives Other (See Comments)    Severe cramping       Objective:    BP 132/77   Pulse 100   Ht 6\' 3"  (1.905 m)   Wt 213 lb (96.6 kg)   SpO2 98%   BMI 26.62 kg/m  Wt Readings from Last 3 Encounters:  02/24/23 213 lb (96.6 kg)  01/31/23 194 lb 0.1 oz (88 kg)  01/12/23 195 lb (88.5 kg)    Physical Exam Vitals and nursing note reviewed.  Constitutional:      Appearance: He is well-developed.  HENT:     Head: Normocephalic and atraumatic.  Cardiovascular:     Rate and Rhythm: Regular rhythm. Tachycardia present.     Heart sounds: Normal heart sounds. No murmur heard.    No friction rub. No gallop.   Pulmonary:     Effort: Pulmonary effort is normal. No tachypnea or respiratory distress.     Breath sounds: Normal  breath sounds. No decreased breath sounds, wheezing, rhonchi or rales.  Chest:     Chest wall: No tenderness.  Abdominal:     General: Bowel sounds are normal.     Palpations: Abdomen is soft.  Musculoskeletal:        General: Normal range of motion.     Cervical back: Normal range of motion.  Skin:    General: Skin is warm and dry.  Neurological:     Mental Status: He is alert and oriented to person, place, and time.     Coordination: Coordination normal.  Psychiatric:        Behavior: Behavior normal. Behavior is cooperative.        Thought Content: Thought content normal.        Judgment: Judgment normal.          Patient has been counseled extensively about nutrition and exercise as well as the importance of adherence with medications and regular follow-up. The patient was given clear instructions to go to ER or return to medical center if symptoms don't improve, worsen or new problems develop. The patient verbalized understanding.   Follow-up: Return in about 3 months (around 05/25/2023).   Claiborne Rigg, FNP-BC [facility] [place], [place] [phone]   02/24/2023, 9:39 PM

## 2023-02-25 ENCOUNTER — Other Ambulatory Visit: Payer: Self-pay | Admitting: Nurse Practitioner

## 2023-02-25 DIAGNOSIS — E7800 Pure hypercholesterolemia, unspecified: Secondary | ICD-10-CM

## 2023-02-25 LAB — HEMOGLOBIN A1C
Est. average glucose Bld gHb Est-mCnc: 117 mg/dL
Hgb A1c MFr Bld: 5.7 % — ABNORMAL HIGH (ref 4.8–5.6)

## 2023-02-25 LAB — CBC WITH DIFFERENTIAL/PLATELET
Basophils Absolute: 0 10*3/uL (ref 0.0–0.2)
Basos: 0 %
EOS (ABSOLUTE): 0.2 10*3/uL (ref 0.0–0.4)
Eos: 3 %
Hematocrit: 46.7 % (ref 37.5–51.0)
Hemoglobin: 15.8 g/dL (ref 13.0–17.7)
Immature Grans (Abs): 0.1 10*3/uL (ref 0.0–0.1)
Immature Granulocytes: 1 %
Lymphocytes Absolute: 1.7 10*3/uL (ref 0.7–3.1)
Lymphs: 29 %
MCH: 32.4 pg (ref 26.6–33.0)
MCHC: 33.8 g/dL (ref 31.5–35.7)
MCV: 96 fL (ref 79–97)
Monocytes Absolute: 0.6 10*3/uL (ref 0.1–0.9)
Monocytes: 10 %
Neutrophils Absolute: 3.4 10*3/uL (ref 1.4–7.0)
Neutrophils: 57 %
Platelets: 126 10*3/uL — ABNORMAL LOW (ref 150–450)
RBC: 4.87 x10E6/uL (ref 4.14–5.80)
RDW: 13.8 % (ref 11.6–15.4)
WBC: 6 10*3/uL (ref 3.4–10.8)

## 2023-02-25 LAB — CMP14+EGFR
ALT: 16 [IU]/L (ref 0–44)
AST: 26 [IU]/L (ref 0–40)
Albumin: 4.4 g/dL (ref 3.8–4.9)
Alkaline Phosphatase: 102 [IU]/L (ref 44–121)
BUN/Creatinine Ratio: 13 (ref 10–24)
BUN: 13 mg/dL (ref 8–27)
Bilirubin Total: 0.5 mg/dL (ref 0.0–1.2)
CO2: 24 mmol/L (ref 20–29)
Calcium: 9.6 mg/dL (ref 8.6–10.2)
Chloride: 101 mmol/L (ref 96–106)
Creatinine, Ser: 1.02 mg/dL (ref 0.76–1.27)
Globulin, Total: 2.6 g/dL (ref 1.5–4.5)
Glucose: 200 mg/dL — ABNORMAL HIGH (ref 70–99)
Potassium: 4.5 mmol/L (ref 3.5–5.2)
Sodium: 140 mmol/L (ref 134–144)
Total Protein: 7 g/dL (ref 6.0–8.5)
eGFR: 84 mL/min/{1.73_m2} (ref >59)

## 2023-02-25 LAB — LIPID PANEL
Chol/HDL Ratio: 2.1 {ratio} (ref 0.0–5.0)
Cholesterol, Total: 200 mg/dL — ABNORMAL HIGH (ref 100–199)
HDL: 95 mg/dL (ref >39)
LDL Chol Calc (NIH): 82 mg/dL (ref 0–99)
Triglycerides: 138 mg/dL (ref 0–149)
VLDL Cholesterol Cal: 23 mg/dL (ref 5–40)

## 2023-02-25 LAB — PSA: Prostate Specific Ag, Serum: 0.6 ng/mL (ref 0.0–4.0)

## 2023-02-25 LAB — MICROALBUMIN / CREATININE URINE RATIO
Creatinine, Urine: 139.3 mg/dL
Microalb/Creat Ratio: 23 mg/g{creat} (ref 0–29)
Microalbumin, Urine: 31.9 ug/mL

## 2023-02-25 MED ORDER — ATORVASTATIN CALCIUM 20 MG PO TABS: 20.0000 mg | ORAL_TABLET | Freq: Every day | ORAL | 3 refills | Status: AC

## 2023-03-13 ENCOUNTER — Ambulatory Visit
Admission: RE | Admit: 2023-03-13 | Discharge: 2023-03-13 | Disposition: A | Discharge status: Home / Self Care | Payer: Medicaid Other | Source: Ambulatory Visit | Attending: Nurse Practitioner | Admitting: Nurse Practitioner

## 2023-03-13 DIAGNOSIS — F17200 Nicotine dependence, unspecified, uncomplicated: Secondary | ICD-10-CM

## 2023-04-11 ENCOUNTER — Encounter: Payer: Self-pay | Admitting: Nurse Practitioner

## 2023-04-11 ENCOUNTER — Telehealth: Payer: Self-pay | Admitting: Nurse Practitioner

## 2023-04-11 ENCOUNTER — Ambulatory Visit: Payer: Self-pay | Admitting: Nurse Practitioner

## 2023-04-11 ENCOUNTER — Emergency Department (HOSPITAL_COMMUNITY)
Admission: EM | Admit: 2023-04-11 | Discharge: 2023-04-11 | Discharge status: Against Medical Advice | Payer: Medicaid Other

## 2023-04-11 ENCOUNTER — Ambulatory Visit: Payer: Medicaid Other | Attending: Nurse Practitioner | Admitting: Nurse Practitioner

## 2023-04-11 VITALS — BP 122/86 | HR 109 | Resp 19 | Ht 75.0 in | Wt 212.2 lb

## 2023-04-11 DIAGNOSIS — L089 Local infection of the skin and subcutaneous tissue, unspecified: Secondary | ICD-10-CM

## 2023-04-11 MED ORDER — MUPIROCIN 2 % EX OINT: 1.0000 | TOPICAL_OINTMENT | Freq: Two times a day (BID) | CUTANEOUS | 1 refills | Status: DC

## 2023-04-11 MED ORDER — SULFAMETHOXAZOLE-TRIMETHOPRIM 800-160 MG PO TABS: 1.0000 | ORAL_TABLET | Freq: Two times a day (BID) | ORAL | 0 refills | Status: AC

## 2023-04-11 NOTE — Progress Notes (Signed)
Assessment & Plan:  Randy Barnes was seen today for cellulitis.  Diagnoses and all orders for this visit:  Skin infection -     CBC with Differential -     sulfamethoxazole-trimethoprim (BACTRIM DS) 800-160 MG tablet; Take 1 tablet by mouth 2 (two) times daily for 7 days. -     mupirocin ointment (BACTROBAN) 2 %; Apply 1 Application topically 2 (two) times daily. -     DG Tibia/Fibula Left; Future -     DG Tibia/Fibula Left; Future    Patient has been counseled on age-appropriate routine health concerns for screening and prevention. These are reviewed and up-to-date. Referrals have been placed accordingly. Immunizations are up-to-date or declined.    Subjective:   Chief Complaint  Patient presents with   Cellulitis    Randy Barnes 63 y.o. male presents to office today with concerns of possible skin infection.    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 He is also developing a wound on the right leg due to his ankle bracelet being too snug as his legs are already edematous.     Review of Systems  Constitutional:  Negative for fever, malaise/fatigue and weight loss.  HENT: Negative.  Negative for nosebleeds.   Eyes: Negative.  Negative for blurred vision, double vision and photophobia.  Respiratory: Negative.  Negative for cough and shortness of breath.   Cardiovascular: Negative.  Negative for chest pain, palpitations and leg swelling.  Gastrointestinal: Negative.  Negative for  heartburn, nausea and vomiting.  Musculoskeletal: Negative.  Negative for myalgias.  Skin:        SEE HPI  Neurological: Negative.  Negative for dizziness, focal weakness, seizures and headaches.  Psychiatric/Behavioral: Negative.  Negative for suicidal ideas.     Past Medical History:  Diagnosis Date   Anxiety disorder due to general medical condition with panic attack    h/o hospitalizations for behavioral issues (1980s, 2000s)   Aorto-iliac atherosclerosis (HCC)    Arthritis    Cataract    COPD (chronic obstructive pulmonary disease) (HCC)    DDD (degenerative disc disease), lumbar    mild with dextroscoliosis   Depression    Emphysema of lung (HCC)    ?asthmatic bronchitis per prior PCP   GERD (gastroesophageal reflux disease)    Hepatitis C    History of stomach ulcers    Hyperlipidemia    Hypertension    Macular degeneration    MDD (major depressive disorder), recurrent episode, moderate (HCC)    Pneumonia    Polysubstance (including opioids) dependence, daily use (HCC) 10/2015   narcotics, heroin   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   CXR negative, pt states he was on antibiotic for 6 months   Problems related to release from prison 07/2014   Psoriasis 01/08/2015   Type 2 diabetes, uncontrolled, with neuropathy    Previous diabetic, no longer taking medications   Urine incontinence     Past Surgical History:  Procedure Laterality Date   STRABISMUS SURGERY     left eye vision loss, multiple surgeries   TONSILLECTOMY  Family History  Problem Relation Age of Onset   Diabetes Father    Heart failure Father    CAD Father    Stroke Mother    Hypertension Mother    Mental illness Mother    Depression Mother    Irritable bowel syndrome Mother    Bipolar disorder Brother        ?   CAD Paternal Grandfather    Stroke Maternal Grandfather     Social History Reviewed with no changes to be made today.   Outpatient Medications Prior to Visit  Medication Sig Dispense  Refill   albuterol (VENTOLIN HFA) 108 (90 Base) MCG/ACT inhaler Inhale 2 puffs into the lungs every 6 (six) hours as needed for wheezing or shortness of breath. 6.7 g 3   atorvastatin (LIPITOR) 20 MG tablet Take 1 tablet (20 mg total) by mouth daily. 90 tablet 3   budesonide-formoterol (SYMBICORT) 160-4.5 MCG/ACT inhaler Inhale 2 puffs into the lungs 2 (two) times daily. 3 each 1   gabapentin (NEURONTIN) 800 MG tablet Take 1 tablet (800 mg total) by mouth 4 (four) times daily. 360 tablet 1   lisinopril (ZESTRIL) 5 MG tablet Take 1 tablet (5 mg total) by mouth daily. 90 tablet 1   omeprazole (PRILOSEC) 20 MG capsule Take 1 capsule (20 mg total) by mouth 2 (two) times daily before a meal. DX:  K21.9 GERD 180 capsule 2   sildenafil (REVATIO) 20 MG tablet TAKE 3 TABLETS BY MOUTH 1/2 HOUR PRIOR TO INTERCOURSE. LIMIT USE TO 3 TABS/ 24 HOURS 30 tablet 3   atenolol (TENORMIN) 25 MG tablet Take 0.5 tablets (12.5 mg total) by mouth daily. (Patient not taking: Reported on 04/11/2023) 90 tablet 1   cariprazine (VRAYLAR) 1.5 MG capsule Take 1 capsule (1.5 mg total) by mouth daily. (Patient not taking: Reported on 04/11/2023) 30 capsule 1   hydrOXYzine (ATARAX) 25 MG tablet Take 1 tablet (25 mg total) by mouth every 6 (six) hours. (Patient not taking: Reported on 04/11/2023) 12 tablet 0   No facility-administered medications prior to visit.    Allergies  Allergen Reactions   Penicillins Hives and Swelling    Did it involve swelling of the face/tongue/throat, SOB, or low BP? No Did it involve sudden or severe rash/hives, skin peeling, or any reaction on the inside of your mouth or nose? Yes Did you need to seek medical attention at a hospital or doctor's office? Yes When did it last happen?      childhood allergy If all above answers are "NO", may proceed with cephalosporin use.    Guaifenesin & Derivatives Other (See Comments)    Severe cramping       Objective:    BP 122/86 (BP Location: Left Arm,  Patient Position: Sitting, Cuff Size: Normal)   Pulse (!) 109   Resp 19   Ht 6\' 3"  (1.905 m)   Wt 212 lb 3.2 oz (96.3 kg)   SpO2 100%   BMI 26.52 kg/m  Wt Readings from Last 3 Encounters:  04/11/23 212 lb 3.2 oz (96.3 kg)  02/24/23 213 lb (96.6 kg)  01/31/23 194 lb 0.1 oz (88 kg)    Physical Exam Vitals and nursing note reviewed.  Constitutional:      Appearance: He is well-developed.  HENT:     Head: Normocephalic and atraumatic.  Cardiovascular:     Rate and Rhythm: Normal rate and regular rhythm.     Heart sounds: Normal heart sounds. No murmur heard.  No friction rub. No gallop.  Pulmonary:     Effort: Pulmonary effort is normal. No tachypnea or respiratory distress.     Breath sounds: Normal breath sounds. No decreased breath sounds, wheezing, rhonchi or rales.  Chest:     Chest wall: No tenderness.  Abdominal:     General: Bowel sounds are normal.     Palpations: Abdomen is soft.  Musculoskeletal:        General: Normal range of motion.     Cervical back: Normal range of motion.  Skin:    General: Skin is warm and dry.     Findings: Erythema and wound present.     Comments: See photos  Neurological:     Mental Status: He is alert and oriented to person, place, and time.     Coordination: Coordination normal.  Psychiatric:        Behavior: Behavior normal. Behavior is cooperative.        Thought Content: Thought content normal.        Judgment: Judgment normal.          Patient has been counseled extensively about nutrition and exercise as well as the importance of adherence with medications and regular follow-up. The patient was given clear instructions to go to ER or return to medical center if symptoms don't improve, worsen or new problems develop. The patient verbalized understanding.   Follow-up: Return in about 2 weeks (around 04/25/2023) for skin infection. DB 130.   Claiborne Rigg, FNP-BC [facility]  [place], [place] [phone]   04/11/2023, 5:04 PM

## 2023-04-11 NOTE — ED Notes (Signed)
Called for triage with no answer

## 2023-04-11 NOTE — Telephone Encounter (Unsigned)
Copied from CRM [id number]. Topic: Clinical - Red Word Triage >> Apr 11, 2023  2:30 PM Phill Myron wrote: Red Word that prompted transfer to Nurse Triage: Left and Right Leg Pain (10).   Puss and sores from both legs. Right leg has monitor on it.

## 2023-04-11 NOTE — Telephone Encounter (Signed)
Copied from CRM [id number]. Topic: Clinical - Red Word Triage >> Apr 11, 2023  2:30 PM Randy Barnes wrote: Red Word that prompted transfer to Nurse Triage: Left and Right Leg Pain (10).   Puss and sores from both legs. Right leg has monitor on it.   Chief Complaint: Sores on legs Symptoms: Sores on legs, surrounding redness and streaking, pus from sores Frequency: Constant  Pertinent Negatives: Patient denies fever Disposition: [] ED /[] Urgent Care (no appt availability in office) / [x] Appointment(In office/virtual)/ []  [facility] [place] [facility]/ [] Home Care/ [] Refused Recommended Disposition /[]  Mobile Bus/ []  Follow-up with PCP Additional Notes: Patient reports he has been experiencing leg sores since last month. He states that he had an ankle monitor placed on his left leg that was too tight and caused sores. He states that it was moved to the right leg and that there are sores on that le as well now. He states that some of the sores are now draining pus, with surrounding redness and some red streaks. Patient denies any fevers. Appointment made for the patient for evaluation of his symptoms.     Reason for Disposition  [1] Red streak runs from the wound AND [2] longer than 1 inch (2.5 cm)  Answer Assessment - Initial Assessment Questions 1. LOCATION: "Where is the wound located?"      Bilateral lower legs 2. WOUND APPEARANCE: "What does the wound look like?"      Sores 3. SIZE: If redness is present, ask: "What is the size of the red area?" (Inches, centimeters, or compare to size of a coin)      Various sizes, largest one 4-5 coins 4. SPREAD: "What's changed in the last day?"  "Do you see any red streaks coming from the wound?"     Yes 5. ONSET: "When did it start to look infected?"      3-4 weeks ago 6. MECHANISM: "How did the wound start, what was the cause?"     Ankle monitor was too tight and caused sores, was on left leg, now on right leg, sores on both legs   7. PAIN: Do you have any pain?"  If Yes, ask: "How bad is the pain?"  (e.g., Scale 1-10; mild, moderate, or severe)    - MILD (1-3): Doesn't interfere with normal activities.     - MODERATE (4-7): Interferes with normal activities or awakens from sleep.    - SEVERE (8-10): Excruciating pain, unable to do any normal activities.       10/10 8. FEVER: "Do you have a fever?" If Yes, ask: "What is your temperature, how was it measured, and when did it start?"     No 9. OTHER SYMPTOMS: "Do you have any other symptoms?" (e.g., shaking chills, weakness, rash elsewhere on body)     Dizziness, pus from some of the sores  Answer Assessment - Initial Assessment Questions 1. APPEARANCE of INJURY: "What does the injury look like?"      Sores  2. SIZE: "How large is the cut?"      "Pretty big" 3. BLEEDING: "Is it bleeding now?" If Yes, ask: "Is it difficult to stop?"      Blood and pus leaking from areas 4. LOCATION: "Where is the injury located?"      Bilateral legs, sores to the side and back of leg  5. ONSET: "How long ago did the injury occur?"      Last month 6. MECHANISM: "Tell me how it happened."  Patient with ankle monitor, stating it's too tight and causing sores. Was moved from left leg to right leg and now both legs are injured with sores.  7. TETANUS: "When was the last tetanus booster?"     Unsure, believes it was in 2014  Protocols used: Skin Injury-A-AH, Wound Infection Suspected-A-AH

## 2023-04-11 NOTE — Telephone Encounter (Signed)
Noted

## 2023-04-12 LAB — CBC WITH DIFFERENTIAL/PLATELET
Basophils Absolute: 0 10*3/uL (ref 0.0–0.2)
Basos: 0 %
EOS (ABSOLUTE): 0.1 10*3/uL (ref 0.0–0.4)
Eos: 1 %
Hematocrit: 45.8 % (ref 37.5–51.0)
Hemoglobin: 15.4 g/dL (ref 13.0–17.7)
Immature Grans (Abs): 0 10*3/uL (ref 0.0–0.1)
Immature Granulocytes: 0 %
Lymphocytes Absolute: 1.5 10*3/uL (ref 0.7–3.1)
Lymphs: 21 %
MCH: 31.8 pg (ref 26.6–33.0)
MCHC: 33.6 g/dL (ref 31.5–35.7)
MCV: 95 fL (ref 79–97)
Monocytes Absolute: 0.6 10*3/uL (ref 0.1–0.9)
Monocytes: 9 %
Neutrophils Absolute: 4.9 10*3/uL (ref 1.4–7.0)
Neutrophils: 69 %
Platelets: 127 10*3/uL — ABNORMAL LOW (ref 150–450)
RBC: 4.84 x10E6/uL (ref 4.14–5.80)
RDW: 12.6 % (ref 11.6–15.4)
WBC: 7.1 10*3/uL (ref 3.4–10.8)

## 2023-05-26 ENCOUNTER — Other Ambulatory Visit: Payer: Self-pay | Admitting: Nurse Practitioner

## 2023-05-26 ENCOUNTER — Ambulatory Visit: Payer: Medicaid Other | Admitting: Nurse Practitioner

## 2023-05-26 DIAGNOSIS — J4489 Other specified chronic obstructive pulmonary disease: Secondary | ICD-10-CM

## 2023-05-27 ENCOUNTER — Other Ambulatory Visit: Payer: Self-pay | Admitting: Nurse Practitioner

## 2023-05-27 DIAGNOSIS — J4489 Other specified chronic obstructive pulmonary disease: Secondary | ICD-10-CM

## 2023-05-27 DIAGNOSIS — M48062 Spinal stenosis, lumbar region with neurogenic claudication: Secondary | ICD-10-CM

## 2023-05-27 MED ORDER — BUDESONIDE-FORMOTEROL FUMARATE 160-4.5 MCG/ACT IN AERO
2.0000 | INHALATION_SPRAY | Freq: Two times a day (BID) | RESPIRATORY_TRACT | 1 refills | Status: DC
Start: 2023-05-27 — End: 2023-12-02

## 2023-06-04 ENCOUNTER — Other Ambulatory Visit: Payer: Self-pay

## 2023-06-19 ENCOUNTER — Other Ambulatory Visit: Payer: Self-pay | Admitting: Nurse Practitioner

## 2023-06-19 DIAGNOSIS — I10 Essential (primary) hypertension: Secondary | ICD-10-CM

## 2023-06-19 DIAGNOSIS — L089 Local infection of the skin and subcutaneous tissue, unspecified: Secondary | ICD-10-CM

## 2023-08-25 ENCOUNTER — Ambulatory Visit: Payer: Self-pay

## 2023-08-25 NOTE — Telephone Encounter (Signed)
 FYI Only or Action Required?: FYI only for provider  Patient was last seen in primary care on 04/11/2023 by Collins Dean, NP. Called Nurse Triage reporting Open Wound. Symptoms began several weeks ago. Interventions attempted: Nothing. Symptoms are: gradually worsening.  Triage Disposition: Go to ED or PCP/Alternative with Approval  Patient/caregiver understands and will follow disposition?: Yes     Copied from CRM [id number]. Topic: Clinical - Red Word Triage >> Aug 25, 2023 12:47 PM Zipporah Him wrote: Red Word that prompted transfer to Nurse Triage: Patient is itching and stinging everywhere, patient states it started in his eyes and now it is now onto the rest of his body.Wanting to know how she would proceed. Feeling like crap. Feet are swollen and hurting. One has a wound on the bottom, but he is not able to look at it. Reason for Disposition  Sounds like a serious injury to the triager  Answer Assessment - Initial Assessment Questions 1. LOCATION: "Where is the puncture located?"      Left foot on the bottom 2. OBJECT: "What was the object that punctured the skin?"      Doesn't remember.  3. DEPTH: "How deep do you think the puncture goes?"      Doesn't remember 4. ONSET: "When did the injury occur?" (Minutes or hours)     States has a parasite in his body. 5. PAIN: "Is it painful?" If Yes, ask: "How bad is the pain?"  (Scale 1-10; or mild, moderate, severe)     severe 6. TETANUS: "When was the last tetanus booster?"     no 7. PREGNANCY: "Is there any chance you are pregnant?" "When was your last menstrual period?"     no  Protocols used: Puncture Wound-A-AH

## 2023-08-25 NOTE — Telephone Encounter (Signed)
Unable to reach patient by phone. Voicemail left to return call.

## 2023-08-25 NOTE — Telephone Encounter (Signed)
 Please call Mr. Randy Barnes and let him know he needs to be seen at the emergency room to r/u sepsis. I have not seen him back in January and treated him for wound infection and instructed him to return to office in 2 weeks for evaluation.  As it is now going on almost 5 months I have no idea how this wound looks or the extent of any possible infection and he may need IV antibiotics.

## 2023-09-15 ENCOUNTER — Other Ambulatory Visit: Payer: Self-pay | Admitting: Nurse Practitioner

## 2023-09-15 DIAGNOSIS — K219 Gastro-esophageal reflux disease without esophagitis: Secondary | ICD-10-CM

## 2023-09-29 ENCOUNTER — Telehealth: Payer: Self-pay | Admitting: Nurse Practitioner

## 2023-09-29 NOTE — Telephone Encounter (Signed)
 Called pt to confirm appt. LVM details for pt

## 2023-09-30 ENCOUNTER — Ambulatory Visit: Payer: Self-pay | Attending: Nurse Practitioner | Admitting: Nurse Practitioner

## 2023-09-30 ENCOUNTER — Encounter: Payer: Self-pay | Admitting: Nurse Practitioner

## 2023-09-30 VITALS — BP 127/71 | HR 75 | Resp 20 | Ht 75.0 in | Wt 188.6 lb

## 2023-09-30 DIAGNOSIS — D224 Melanocytic nevi of scalp and neck: Secondary | ICD-10-CM | POA: Insufficient documentation

## 2023-09-30 DIAGNOSIS — G8929 Other chronic pain: Secondary | ICD-10-CM | POA: Diagnosis not present

## 2023-09-30 DIAGNOSIS — H538 Other visual disturbances: Secondary | ICD-10-CM | POA: Diagnosis not present

## 2023-09-30 DIAGNOSIS — F200 Paranoid schizophrenia: Secondary | ICD-10-CM | POA: Diagnosis not present

## 2023-09-30 DIAGNOSIS — I959 Hypotension, unspecified: Secondary | ICD-10-CM | POA: Diagnosis not present

## 2023-09-30 DIAGNOSIS — M79671 Pain in right foot: Secondary | ICD-10-CM | POA: Insufficient documentation

## 2023-09-30 DIAGNOSIS — I10 Essential (primary) hypertension: Secondary | ICD-10-CM | POA: Diagnosis not present

## 2023-09-30 DIAGNOSIS — M79605 Pain in left leg: Secondary | ICD-10-CM | POA: Insufficient documentation

## 2023-09-30 DIAGNOSIS — N528 Other male erectile dysfunction: Secondary | ICD-10-CM | POA: Insufficient documentation

## 2023-09-30 DIAGNOSIS — Z79899 Other long term (current) drug therapy: Secondary | ICD-10-CM | POA: Insufficient documentation

## 2023-09-30 DIAGNOSIS — L84 Corns and callosities: Secondary | ICD-10-CM | POA: Insufficient documentation

## 2023-09-30 DIAGNOSIS — R55 Syncope and collapse: Secondary | ICD-10-CM | POA: Diagnosis not present

## 2023-09-30 DIAGNOSIS — N529 Male erectile dysfunction, unspecified: Secondary | ICD-10-CM

## 2023-09-30 DIAGNOSIS — H539 Unspecified visual disturbance: Secondary | ICD-10-CM

## 2023-09-30 DIAGNOSIS — M21612 Bunion of left foot: Secondary | ICD-10-CM | POA: Insufficient documentation

## 2023-09-30 DIAGNOSIS — Z23 Encounter for immunization: Secondary | ICD-10-CM

## 2023-09-30 DIAGNOSIS — L089 Local infection of the skin and subcutaneous tissue, unspecified: Secondary | ICD-10-CM | POA: Insufficient documentation

## 2023-09-30 DIAGNOSIS — E0840 Diabetes mellitus due to underlying condition with diabetic neuropathy, unspecified: Secondary | ICD-10-CM | POA: Diagnosis present

## 2023-09-30 DIAGNOSIS — B070 Plantar wart: Secondary | ICD-10-CM

## 2023-09-30 DIAGNOSIS — M79672 Pain in left foot: Secondary | ICD-10-CM | POA: Insufficient documentation

## 2023-09-30 DIAGNOSIS — Z5986 Financial insecurity: Secondary | ICD-10-CM | POA: Diagnosis not present

## 2023-09-30 DIAGNOSIS — F331 Major depressive disorder, recurrent, moderate: Secondary | ICD-10-CM

## 2023-09-30 MED ORDER — SILDENAFIL CITRATE 20 MG PO TABS: ORAL_TABLET | ORAL | 3 refills | Status: DC

## 2023-09-30 MED ORDER — MUPIROCIN 2 % EX OINT
1.0000 | TOPICAL_OINTMENT | Freq: Two times a day (BID) | CUTANEOUS | 1 refills | Status: AC
Start: 2023-09-30 — End: ?
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
## 2023-10-15 ENCOUNTER — Ambulatory Visit: Admitting: Podiatry

## 2023-10-17 ENCOUNTER — Ambulatory Visit (INDEPENDENT_AMBULATORY_CARE_PROVIDER_SITE_OTHER)

## 2023-10-17 ENCOUNTER — Ambulatory Visit (INDEPENDENT_AMBULATORY_CARE_PROVIDER_SITE_OTHER): Admitting: Podiatry

## 2023-10-17 VITALS — Ht 75.0 in | Wt 188.6 lb

## 2023-10-17 DIAGNOSIS — S91302A Unspecified open wound, left foot, initial encounter: Secondary | ICD-10-CM

## 2023-10-17 DIAGNOSIS — E08621 Diabetes mellitus due to underlying condition with foot ulcer: Secondary | ICD-10-CM | POA: Diagnosis not present

## 2023-10-17 DIAGNOSIS — L97422 Non-pressure chronic ulcer of left heel and midfoot with fat layer exposed: Secondary | ICD-10-CM | POA: Diagnosis not present

## 2023-10-17 MED ORDER — MUPIROCIN 2 % EX OINT: 1.0000 | TOPICAL_OINTMENT | Freq: Two times a day (BID) | CUTANEOUS | 2 refills | Status: AC

## 2023-10-17 MED ORDER — DOXYCYCLINE HYCLATE 100 MG PO TABS: 100.0000 mg | ORAL_TABLET | Freq: Two times a day (BID) | ORAL | 0 refills | Status: DC

## 2023-10-20 NOTE — Progress Notes (Signed)
  Subjective:  Patient ID: Randy Barnes, male    DOB: Sep 24, 1960,  MRN: [id number]  Chief Complaint  Patient presents with   Plantar Warts    RM 11 Patient is here bilateral plantar warts.    Discussed the use of AI scribe software for clinical note transcription with the patient, who gave verbal consent to proceed.  History of Present Illness Randy Barnes is a 63 year old male with neuropathy who presents with a painful open wound on the left heel.  He has an open wound on the bottom of his left heel, initially very painful, which was lanced recently to relieve pressure, resulting in fluid drainage. The wound feels better now. Neuropathy in his feet may contribute to a lack of awareness of injuries. He recalls a previous foot sore that healed easily.  Currently does not report any fevers or chills.      Objective:    Physical Exam General: AAO x3, NAD  Dermatological: Aspect left heel hyperkeratotic lesion with central ulceration present.  Picture below was prior to debridement once I debrided this is a granular wound noted measuring approximately 0.4 x 0.3 x 0.2 cm without any probing, undermining or tunneling.  There is no fluctuation or crepitation.  Small amount of clear drainage expressed with there is no frank purulence.   Vascular: Dorsalis Pedis artery and Posterior Tibial artery pedal pulses are 2/4 bilateral with immedate capillary fill time. There is no pain with calf compression, swelling, warmth, erythema.   Neruologic: Sensation decreased.  Musculoskeletal: No significant pain on exam today.        Results RADIOLOGY Left foot X-ray: No evidence of acute fracture or obvious foreign object.  No definitive cortical changes to suggest osteomyelitis.  (10/17/2023)     Assessment:   1. Wound of left foot   2. 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  Assessment and Plan Assessment & Plan Open wound of left heel -Sharp debride the hyperkeratotic lesion and upon debridement there is a granular wound noted.  Medically necessary wound debridement was performed today.  I utilized a #312 blade scalpel to sharply debride nonviable, deep lysed tissue in order to promote wound healing.  Cleaned the area with saline.  Antibiotic ointment was applied followed by dressing. - Prescribe mupirocin  ointment. - Prescribe doxycycline . - Advise against self-draining or popping any further blisters. - Instruct to report any signs or symptoms of infection.  Peripheral neuropathy Neuropathy in feet impairs injury detection.  Discussed daily foot inspection.   Return in about 2 weeks (around 10/31/2023) for heel ulcer.   Randy Barnes DPM

## 2023-11-03 ENCOUNTER — Ambulatory Visit: Admitting: Podiatry

## 2023-11-03 DIAGNOSIS — E08621 Diabetes mellitus due to underlying condition with foot ulcer: Secondary | ICD-10-CM | POA: Diagnosis not present

## 2023-11-03 DIAGNOSIS — L97422 Non-pressure chronic ulcer of left heel and midfoot with fat layer exposed: Secondary | ICD-10-CM

## 2023-11-03 MED ORDER — DOXYCYCLINE HYCLATE 100 MG PO TABS: 100.0000 mg | ORAL_TABLET | Freq: Two times a day (BID) | ORAL | 0 refills | Status: AC

## 2023-11-03 NOTE — Progress Notes (Signed)
  Subjective:  Patient ID: Randy Barnes, male    DOB: 1961-03-06,  MRN: [id number] Chief Complaint  Patient presents with   Foot Ulcer    Left heel ulcer follow up pt stated that he feels like it is doing better he stated that it is not causing him pain like it was      History of Present Illness Randy Barnes is a 63 year old male with neuropathy who presents with a painful open wound on the left heel.  States he lost his medication he did not finish the course.  He states he was in a call the office but his foot was feeling better.  He does not report stepping any foreign objects or any injuries.  Does not report any swelling or redness or any drainage.  No other concerns today.  No fevers or chills.     Objective:    Physical Exam General: AAO x3, NAD  Dermatological: Aspect left heel hyperkeratotic lesion appears to be healing in.  The area is still preulcerative today.  There is no surrounding erythema but with trace edema.  There is no drainage or pus.  No fluctuation or crepitation.  There is no malodor.  Vascular: Dorsalis Pedis artery and Posterior Tibial artery pedal pulses are 2/4 bilateral with immedate capillary fill time. There is no pain with calf compression, swelling, warmth, erythema.   Neruologic: Sensation decreased.  Musculoskeletal: No significant pain on exam today.    Assessment:   1. Wound of left foot   2. 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  Assessment and Plan Assessment & Plan Open wound of left heel -Sharply debrided the hyperkeratotic lesion without any complications or bleeding.  The area is healing in but is still preulcerative.  Recommended antibiotic ointment dressing changes daily.  I refilled the doxycycline  today.  Monitoring signs or symptoms of infection. -Monitor for any  clinical signs or symptoms of infection and directed to call the office immediately should any occur or go to the ER.  Return in about 4 weeks (around 12/01/2023) for heel wound.  Donnice JONELLE Fees DPM

## 2023-12-01 ENCOUNTER — Other Ambulatory Visit: Payer: Self-pay | Admitting: Nurse Practitioner

## 2023-12-01 DIAGNOSIS — J4489 Other specified chronic obstructive pulmonary disease: Secondary | ICD-10-CM

## 2023-12-01 DIAGNOSIS — I10 Essential (primary) hypertension: Secondary | ICD-10-CM

## 2023-12-01 DIAGNOSIS — M48062 Spinal stenosis, lumbar region with neurogenic claudication: Secondary | ICD-10-CM

## 2023-12-16 ENCOUNTER — Other Ambulatory Visit: Payer: Self-pay | Admitting: Nurse Practitioner

## 2023-12-16 DIAGNOSIS — K219 Gastro-esophageal reflux disease without esophagitis: Secondary | ICD-10-CM

## 2023-12-18 ENCOUNTER — Other Ambulatory Visit: Payer: Self-pay | Admitting: Nurse Practitioner

## 2023-12-18 DIAGNOSIS — N528 Other male erectile dysfunction: Secondary | ICD-10-CM

## 2023-12-18 NOTE — Telephone Encounter (Signed)
 Requested Prescriptions  Pending Prescriptions Disp Refills   sildenafil  (REVATIO ) 20 MG tablet [Pharmacy Med Name: SILDENAFIL  20 MG TABLET] 30 tablet 2    Sig: TAKE 3 TABLETS BY MOUTH 30 MINUTES PRIOR TO INTERCOURSE. MAX: 3 TABLETS/ 24 HOURS     Urology: Erectile Dysfunction Agents Passed - 12/18/2023 11:53 AM      Passed - AST in normal range and within 360 days    AST  Date Value Ref Range Status  02/24/2023 26 0 - 40 IU/L Final  12/12/2014 24 U/L Final         Passed - ALT in normal range and within 360 days    ALT  Date Value Ref Range Status  02/24/2023 16 0 - 44 IU/L Final  09/27/2015 17 9 - 46 U/L Final         Passed - Last BP in normal range    BP Readings from Last 1 Encounters:  09/30/23 127/71         Passed - Valid encounter within last 12 months    Recent Outpatient Visits           2 months ago Diabetes mellitus due to underlying condition, controlled, with diabetic neuropathy, without long-term current use of insulin  (HCC)   [facility] [place] Theotis Haze ORN, NP   8 months ago Skin infection   [facility] [place]. [facility] Theotis Haze ORN, NP   9 months ago Essential hypertension   [facility] [place] Theotis Haze ORN, NP   2 years ago Medication refill   [facility] [place]. [facility] Theotis Haze ORN, NP   3 years ago Dysuria   [facility] [place]. [facility] Swords, [place] DEL, MD       Future Appointments             In 1 week Theotis Haze ORN, NP [facility] [facility] [place]. [facility] [place]

## 2023-12-31 ENCOUNTER — Ambulatory Visit: Admitting: Nurse Practitioner

## 2024-02-24 ENCOUNTER — Other Ambulatory Visit: Payer: Self-pay | Admitting: Family Medicine

## 2024-02-24 DIAGNOSIS — K219 Gastro-esophageal reflux disease without esophagitis: Secondary | ICD-10-CM

## 2024-04-13 ENCOUNTER — Other Ambulatory Visit: Payer: Self-pay | Admitting: Internal Medicine

## 2024-04-13 DIAGNOSIS — K219 Gastro-esophageal reflux disease without esophagitis: Secondary | ICD-10-CM

## 2024-04-16 ENCOUNTER — Other Ambulatory Visit: Payer: Self-pay | Admitting: Nurse Practitioner

## 2024-04-16 ENCOUNTER — Ambulatory Visit: Payer: Self-pay | Admitting: Nurse Practitioner

## 2024-04-16 DIAGNOSIS — N528 Other male erectile dysfunction: Secondary | ICD-10-CM

## 2024-04-16 DIAGNOSIS — M48062 Spinal stenosis, lumbar region with neurogenic claudication: Secondary | ICD-10-CM

## 2024-04-16 NOTE — Telephone Encounter (Signed)
 Erroneous encounter

## 2024-04-16 NOTE — Telephone Encounter (Signed)
 CRM # [id number] Owner: None Status: Resolved Open  Priority: Routine Created on: 04/16/2024 09:21 AM By: Joel Dixons Barnes   Primary Information  Source  Randy Sharolyn BIRCH (Patient)   Subject  Randy Barnes (Patient)   Topic  Clinical - Medication Refill    Communication  Medication: sildenafil  (REVATIO ) 20 MG tablet        gabapentin  (NEURONTIN ) 800 MG tablet        Has the patient contacted their pharmacy? No.        This is the patient's preferred pharmacy:        [facility] [id number] - [place], [place] - [address]    [address]    [place] [address]    Phone: [phone] Fax: [phone]        Is this the correct pharmacy for this prescription? Yes            Has the prescription been filled recently? Yes        Is the patient out of the medication? Yes        Has the patient been seen for an appointment in the last year OR does the patient have an upcoming appointment? Yes. Last appt(acute) with Zelda Fleming was 09/30/23. Next appt is 05/04/24        Can we respond through MyChart? No.Patient can be reached at [phone]      Agent: Please be advised that Rx refills may take up to 3 business days. We ask that you follow-up with your pharmacy.

## 2024-04-19 MED ORDER — SILDENAFIL CITRATE 20 MG PO TABS: ORAL_TABLET | ORAL | 0 refills | Status: AC

## 2024-04-28 ENCOUNTER — Ambulatory Visit: Payer: Self-pay

## 2024-05-04 ENCOUNTER — Ambulatory Visit: Payer: Self-pay | Admitting: Nurse Practitioner
